# Patient Record
Sex: Female | Born: 1970 | Race: Black or African American | Hispanic: No | Marital: Married | Smoking: Never smoker
Health system: Southern US, Community
[De-identification: ages and names within clinical notes are randomized; demographics above are authoritative.]

## PROBLEM LIST (undated history)

## (undated) DIAGNOSIS — D649 Anemia, unspecified: Secondary | ICD-10-CM

## (undated) DIAGNOSIS — R338 Other retention of urine: Secondary | ICD-10-CM

## (undated) DIAGNOSIS — J189 Pneumonia, unspecified organism: Secondary | ICD-10-CM

## (undated) DIAGNOSIS — E119 Type 2 diabetes mellitus without complications: Secondary | ICD-10-CM

## (undated) DIAGNOSIS — I10 Essential (primary) hypertension: Secondary | ICD-10-CM

## (undated) DIAGNOSIS — K219 Gastro-esophageal reflux disease without esophagitis: Secondary | ICD-10-CM

## (undated) DIAGNOSIS — M1990 Unspecified osteoarthritis, unspecified site: Secondary | ICD-10-CM

## (undated) DIAGNOSIS — K5900 Constipation, unspecified: Secondary | ICD-10-CM

## (undated) HISTORY — PX: CARPAL TUNNEL RELEASE: SHX101

## (undated) HISTORY — PX: OTHER SURGICAL HISTORY: SHX169

## (undated) HISTORY — PX: FEMUR FRACTURE SURGERY: SHX633

## (undated) HISTORY — PX: TUBAL LIGATION: SHX77

---

## 2014-07-04 ENCOUNTER — Emergency Department (HOSPITAL_BASED_OUTPATIENT_CLINIC_OR_DEPARTMENT_OTHER)
Admission: EM | Admit: 2014-07-04 | Discharge: 2014-07-04 | Disposition: A | Discharge status: Home / Self Care | Payer: No Typology Code available for payment source | Attending: Emergency Medicine | Admitting: Emergency Medicine

## 2014-07-04 ENCOUNTER — Encounter (HOSPITAL_BASED_OUTPATIENT_CLINIC_OR_DEPARTMENT_OTHER): Payer: Self-pay | Admitting: *Deleted

## 2014-07-04 DIAGNOSIS — I10 Essential (primary) hypertension: Secondary | ICD-10-CM | POA: Insufficient documentation

## 2014-07-04 DIAGNOSIS — Z79899 Other long term (current) drug therapy: Secondary | ICD-10-CM | POA: Diagnosis not present

## 2014-07-04 DIAGNOSIS — J209 Acute bronchitis, unspecified: Secondary | ICD-10-CM | POA: Insufficient documentation

## 2014-07-04 DIAGNOSIS — J40 Bronchitis, not specified as acute or chronic: Secondary | ICD-10-CM

## 2014-07-04 DIAGNOSIS — R05 Cough: Secondary | ICD-10-CM | POA: Diagnosis present

## 2014-07-04 HISTORY — DX: Essential (primary) hypertension: I10

## 2014-07-04 MED ORDER — DOXYCYCLINE HYCLATE 100 MG PO TABS
100.0000 mg | ORAL_TABLET | Freq: Once | ORAL | Status: AC
  Administered 2014-07-04: 100 mg via ORAL
  Filled 2014-07-04: qty 1

## 2014-07-04 MED ORDER — HYDROCODONE-HOMATROPINE 5-1.5 MG/5ML PO SYRP: 5.0000 mL | ORAL_SOLUTION | Freq: Four times a day (QID) | ORAL | Status: DC | PRN

## 2014-07-04 MED ORDER — DOXYCYCLINE HYCLATE 100 MG PO TABS: 100.0000 mg | ORAL_TABLET | Freq: Two times a day (BID) | ORAL | Status: DC

## 2014-07-04 NOTE — Discharge Instructions (Signed)
Upper Respiratory Infection, Adult An upper respiratory infection (URI) is also sometimes known as the common cold. The upper respiratory tract includes the nose, sinuses, throat, trachea, and bronchi. Bronchi are the airways leading to the lungs. Most people improve within 1 week, but symptoms can last up to 2 weeks. A residual cough may last even longer.  CAUSES Many different viruses can infect the tissues lining the upper respiratory tract. The tissues become irritated and inflamed and often become very moist. Mucus production is also common. A cold is contagious. You can easily spread the virus to others by oral contact. This includes kissing, sharing a glass, coughing, or sneezing. Touching your mouth or nose and then touching a surface, which is then touched by another person, can also spread the virus. SYMPTOMS  Symptoms typically develop 1 to 3 days after you come in contact with a cold virus. Symptoms vary from person to person. They may include:  Runny nose.  Sneezing.  Nasal congestion.  Sinus irritation.  Sore throat.  Loss of voice (laryngitis).  Cough.  Fatigue.  Muscle aches.  Loss of appetite.  Headache.  Low-grade fever. DIAGNOSIS  You might diagnose your own cold based on familiar symptoms, since most people get a cold 2 to 3 times a year. Your caregiver can confirm this based on your exam. Most importantly, your caregiver can check that your symptoms are not due to another disease such as strep throat, sinusitis, pneumonia, asthma, or epiglottitis. Blood tests, throat tests, and X-rays are not necessary to diagnose a common cold, but they may sometimes be helpful in excluding other more serious diseases. Your caregiver will decide if any further tests are required. RISKS AND COMPLICATIONS  You may be at risk for a more severe case of the common cold if you smoke cigarettes, have chronic heart disease (such as heart failure) or lung disease (such as asthma), or if  you have a weakened immune system. The very young and very old are also at risk for more serious infections. Bacterial sinusitis, middle ear infections, and bacterial pneumonia can complicate the common cold. The common cold can worsen asthma and chronic obstructive pulmonary disease (COPD). Sometimes, these complications can require emergency medical care and may be life-threatening. PREVENTION  The best way to protect against getting a cold is to practice good hygiene. Avoid oral or hand contact with people with cold symptoms. Wash your hands often if contact occurs. There is no clear evidence that vitamin C, vitamin E, echinacea, or exercise reduces the chance of developing a cold. However, it is always recommended to get plenty of rest and practice good nutrition. TREATMENT  Treatment is directed at relieving symptoms. There is no cure. Antibiotics are not effective, because the infection is caused by a virus, not by bacteria. Treatment may include:  Increased fluid intake. Sports drinks offer valuable electrolytes, sugars, and fluids.  Breathing heated mist or steam (vaporizer or shower).  Eating chicken soup or other clear broths, and maintaining good nutrition.  Getting plenty of rest.  Using gargles or lozenges for comfort.  Controlling fevers with ibuprofen or acetaminophen as directed by your caregiver.  Increasing usage of your inhaler if you have asthma. Zinc gel and zinc lozenges, taken in the first 24 hours of the common cold, can shorten the duration and lessen the severity of symptoms. Pain medicines may help with fever, muscle aches, and throat pain. A variety of non-prescription medicines are available to treat congestion and runny nose. Your caregiver   can make recommendations and may suggest nasal or lung inhalers for other symptoms.  HOME CARE INSTRUCTIONS   Only take over-the-counter or prescription medicines for pain, discomfort, or fever as directed by your  caregiver.  Use a warm mist humidifier or inhale steam from a shower to increase air moisture. This may keep secretions moist and make it easier to breathe.  Drink enough water and fluids to keep your urine clear or pale yellow.  Rest as needed.  Return to work when your temperature has returned to normal or as your caregiver advises. You may need to stay home longer to avoid infecting others. You can also use a face mask and careful hand washing to prevent spread of the virus. SEEK MEDICAL CARE IF:   After the first few days, you feel you are getting worse rather than better.  You need your caregiver's advice about medicines to control symptoms.  You develop chills, worsening shortness of breath, or brown or red sputum. These may be signs of pneumonia.  You develop yellow or brown nasal discharge or pain in the face, especially when you bend forward. These may be signs of sinusitis.  You develop a fever, swollen neck glands, pain with swallowing, or white areas in the back of your throat. These may be signs of strep throat. SEEK IMMEDIATE MEDICAL CARE IF:   You have a fever.  You develop severe or persistent headache, ear pain, sinus pain, or chest pain.  You develop wheezing, a prolonged cough, cough up blood, or have a change in your usual mucus (if you have chronic lung disease).  You develop sore muscles or a stiff neck. Document Released: 10/09/2000 Document Revised: 07/08/2011 Document Reviewed: 07/21/2013 [employer] Patient Information 2015 [employer]. This information is not intended to replace advice given to you by your health care provider. Make sure you discuss any questions you have with your health care provider.  

## 2014-07-04 NOTE — ED Provider Notes (Signed)
CSN: [id number]     Arrival date & time 07/04/14  1912 History  This chart was scribed for [name], MD by Tonye Royalty, ED Scribe. This patient was seen in room MH03/MH03 and the patient's care was started at 10:14 PM.    Chief Complaint  Patient presents with  . Cough   The history is provided by the patient. No language interpreter was used.    HPI Comments: Jill Holt is a 44 y.o. female who presents to the Emergency Department complaining of cough with onset 1 week ago. She states cough produced mildly colored phlegm, but now is non-productive. She denies smoking or asthma. She notes she had a friend who was sick with an URI. She has HTN and uses medication for it, but does not taken Lisinopril because it causes her to cough. She denies fever.  Past Medical History  Diagnosis Date  . Hypertension    Past Surgical History  Procedure Laterality Date  . Femur fracture surgery    . Carpal tunnel release    . Cesarean section     No family history on file. History  Substance Use Topics  . Smoking status: Never Smoker   . Smokeless tobacco: Never Used  . Alcohol Use: Yes     Comment: occasional   OB History    No data available     Review of Systems  Constitutional: Negative for fever.  Respiratory: Positive for cough.   All other systems reviewed and are negative.     Allergies  Lisinopril  Home Medications   Prior to Admission medications   Medication Sig Start Date End Date Taking? Authorizing Provider  AMLODIPINE BESYLATE PO Take by mouth.   Yes Historical Provider, MD  benzocaine (HURRICAINE) 20 % SOLN Use as directed 1 application in the mouth or throat.   Yes Historical Provider, MD  LOSARTAN POTASSIUM PO Take by mouth.   Yes Historical Provider, MD  doxycycline (VIBRA-TABS) 100 MG tablet Take 1 tablet (100 mg total) by mouth 2 (two) times daily. 07/04/14   [name], MD  HYDROcodone-homatropine [facility]) 5-1.5 MG/5ML syrup Take 5 mLs by mouth every  6 (six) hours as needed for cough. 07/04/14   [name], MD   BP 142/93 mmHg  Pulse 89  Temp(Src) 99 F (37.2 C) (Oral)  Resp 20  Ht  (1.6 m)  Wt 216 lb (97.977 kg)  BMI 38.27 kg/m2  SpO2 98%  LMP 06/27/2014 Physical Exam  Constitutional: She is oriented to person, place, and time. She appears well-developed and well-nourished. No distress.  HENT:  Head: Normocephalic and atraumatic.  Eyes: Pupils are equal, round, and reactive to light.  Neck: Normal range of motion.  Cardiovascular: Normal rate and intact distal pulses.   Pulmonary/Chest: No respiratory distress. She has no wheezes. She has no rales.  Abdominal: Normal appearance. She exhibits no distension. There is no tenderness.  Musculoskeletal: Normal range of motion.  Neurological: She is alert and oriented to person, place, and time. No cranial nerve deficit.  Skin: Skin is warm and dry. No rash noted.  Psychiatric: She has a normal mood and affect. Her behavior is normal.  Nursing note and vitals reviewed.   ED Course  Procedures (including critical care time) Medications  doxycycline (VIBRA-TABS) tablet 100 mg (not administered)     DIAGNOSTIC STUDIES: Oxygen Saturation is 98% on room air, normal by my interpretation.    COORDINATION OF CARE: 10:18 PM Discussed treatment plan with patient at  beside, including antibiotics and cough medication. The patient agrees with the plan and has no further questions at this time.   Labs Review Labs Reviewed - No data to display     MDM   Final diagnoses:  Bronchitis    I personally performed the services described in this documentation, which was scribed in my presence. The recorded information has been reviewed and considered.   Nelva Nayobert Carlester Kasparek, MD 07/04/14 [phone]

## 2014-07-04 NOTE — ED Notes (Signed)
Nagging cough x 1 week- non-productive

## 2015-12-03 ENCOUNTER — Encounter (HOSPITAL_BASED_OUTPATIENT_CLINIC_OR_DEPARTMENT_OTHER): Payer: Self-pay | Admitting: *Deleted

## 2015-12-03 ENCOUNTER — Emergency Department (HOSPITAL_BASED_OUTPATIENT_CLINIC_OR_DEPARTMENT_OTHER): Payer: No Typology Code available for payment source

## 2015-12-03 ENCOUNTER — Emergency Department (HOSPITAL_BASED_OUTPATIENT_CLINIC_OR_DEPARTMENT_OTHER)
Admission: EM | Admit: 2015-12-03 | Discharge: 2015-12-03 | Disposition: A | Discharge status: Home / Self Care | Payer: No Typology Code available for payment source | Attending: Emergency Medicine | Admitting: Emergency Medicine

## 2015-12-03 DIAGNOSIS — E119 Type 2 diabetes mellitus without complications: Secondary | ICD-10-CM | POA: Insufficient documentation

## 2015-12-03 DIAGNOSIS — I10 Essential (primary) hypertension: Secondary | ICD-10-CM | POA: Insufficient documentation

## 2015-12-03 DIAGNOSIS — M545 Low back pain, unspecified: Secondary | ICD-10-CM

## 2015-12-03 DIAGNOSIS — M549 Dorsalgia, unspecified: Secondary | ICD-10-CM | POA: Diagnosis present

## 2015-12-03 DIAGNOSIS — Z79899 Other long term (current) drug therapy: Secondary | ICD-10-CM | POA: Diagnosis not present

## 2015-12-03 HISTORY — DX: Type 2 diabetes mellitus without complications: E11.9

## 2015-12-03 LAB — URINE MICROSCOPIC-ADD ON

## 2015-12-03 LAB — URINALYSIS, ROUTINE W REFLEX MICROSCOPIC
Bilirubin Urine: NEGATIVE
Glucose, UA: 100 mg/dL — AB
Hgb urine dipstick: NEGATIVE
KETONES UR: NEGATIVE mg/dL
Leukocytes, UA: NEGATIVE
NITRITE: NEGATIVE
Protein, ur: 100 mg/dL — AB
Specific Gravity, Urine: 1.024 (ref 1.005–1.030)
pH: 6 (ref 5.0–8.0)

## 2015-12-03 LAB — PREGNANCY, URINE: Preg Test, Ur: NEGATIVE

## 2015-12-03 MED ORDER — IBUPROFEN 800 MG PO TABS
800.0000 mg | ORAL_TABLET | Freq: Once | ORAL | Status: AC
  Administered 2015-12-03: 800 mg via ORAL
  Filled 2015-12-03: qty 1

## 2015-12-03 NOTE — ED Provider Notes (Signed)
[facility]   CSN: [id number] Arrival date & time: 12/03/15  1751  First Provider Contact:   First MD Initiated Contact with Patient 12/03/15 2056    By signing my name below, I, Jill Holt, attest that this documentation has been prepared under the direction and in the presence of Jill Guise, MD . Electronically Signed: Levon Holt, Scribe. 12/03/2015. 9:47 PM  History   Chief Complaint Chief Complaint  Patient presents with  . Back Pain    HPI Jill Holt is a 45 y.o. female with PMHx opfHTN and DM who presents to the Emergency Department complaining of moderate, sudden onset low back pain which began three days ago. She was standing up from the toilet at the time of onset. Pt denies any injury or fall. She states pain is reproduced when she is standing up after initially sitting or laying for a long time, but fades and goes away after she walks around for a while. Complains of mild suprapubic discomfort. States two weeks ago had soreness over left side of neck w/ movement, but now resolved. Pt was given 800 mg ibuprofen in the ED with relief. She states she has never had back pain like this before. Pt denies fever, chills, dysuria, increased urinary frequency, increased urgency, incontinence, CP, SOB, numbness, or  weakness.    The history is provided by the patient. No language interpreter was used.    Past Medical History:  Diagnosis Date  . Diabetes mellitus without complication (HCC)   . Hypertension     There are no active problems to display for this patient.   Past Surgical History:  Procedure Laterality Date  . CARPAL TUNNEL RELEASE    . CESAREAN SECTION    . FEMUR FRACTURE SURGERY      OB History    No data available      Home Medications    Prior to Admission medications   Medication Sig Start Date End Date Taking? Authorizing Provider  Canagliflozin (INVOKANA PO) Take by mouth.   Yes Historical Provider, MD    AMLODIPINE BESYLATE PO Take by mouth.    Historical Provider, MD  benzocaine (HURRICAINE) 20 % SOLN Use as directed 1 application in the mouth or throat.    Historical Provider, MD  doxycycline (VIBRA-TABS) 100 MG tablet Take 1 tablet (100 mg total) by mouth 2 (two) times daily. 07/04/14   [name], MD  HYDROcodone-homatropine [facility]) 5-1.5 MG/5ML syrup Take 5 mLs by mouth every 6 (six) hours as needed for cough. 07/04/14   [name], MD  LOSARTAN POTASSIUM PO Take by mouth.    Historical Provider, MD    Family History No family history on file.  Social History Social History  Substance Use Topics  . Smoking status: Never Smoker  . Smokeless tobacco: Never Used  . Alcohol use Yes     Comment: occasional     Allergies   Lisinopril   Review of Systems Review of Systems  Constitutional: Negative for chills and fever.  Respiratory: Negative for shortness of breath.   Cardiovascular: Negative for chest pain.  Gastrointestinal: Positive for abdominal pain.  Genitourinary: Negative for dysuria, frequency and urgency.  Musculoskeletal: Positive for back pain and neck pain.  Neurological: Negative for weakness and numbness.  All other systems reviewed and are negative.   Physical Exam Updated Vital Signs BP 149/100 (BP Location: Right Arm)   Pulse 81   Temp 99 F (37.2 C)   Resp 20  Ht 5\' 3"  (1.6 m)   Wt 220 lb (99.8 kg)   LMP 11/13/2015   SpO2 98%   BMI 38.97 kg/m   Physical Exam Physical Exam  Nursing note and vitals reviewed. Constitutional: Well developed, well nourished, non-toxic, and in no acute distress Head: Normocephalic and atraumatic.  Mouth/Throat: Oropharynx is clear and moist.  Neck: Normal range of motion. Neck supple. No neck tenderness. Cardiovascular: Normal rate and regular rhythm.  +2 DP pulses bilaterally.  Pulmonary/Chest: Effort normal and breath sounds normal.  Abdominal: Soft. There is no tenderness. There is no rebound and no  guarding. No CVA tenderness. Suprapubic TTP.  Musculoskeletal: Normal range of motion. No ttp of the tls spine. No step-offs or deformities.  Neurological: Alert, no facial droop, fluent speech, moves all extremities symmetrically. Sensation to light touch intact throughout BLE. She has full strength in bilateral ankle, dorsi-, and plantar flexion, bilateral knee flexion and extension, and bilateral hip flexion and extension  Skin: Skin is warm and dry.  Psychiatric: Cooperative  ED Treatments / Results  DIAGNOSTIC STUDIES:  Oxygen Saturation is 99% on RA, normal by my interpretation.    COORDINATION OF CARE:  9:03 PM Discussed treatment plan with pt at bedside and pt agreed to plan.  Labs (all labs ordered are listed, but only abnormal results are displayed) Labs Reviewed  URINALYSIS, ROUTINE W REFLEX MICROSCOPIC (NOT AT [facility]) - Abnormal; Notable for the following:       Result Value   Glucose, UA 100 (*)    Protein, ur 100 (*)    All other components within normal limits  URINE MICROSCOPIC-ADD ON - Abnormal; Notable for the following:    Squamous Epithelial / LPF 0-5 (*)    Bacteria, UA MANY (*)    All other components within normal limits  PREGNANCY, URINE    EKG  EKG Interpretation None      Radiology Dg Lumbar Spine Complete  Result Date: 12/03/2015 CLINICAL DATA:  Acute onset of lower back pain, radiating to the coccyx. Initial encounter. EXAM: LUMBAR SPINE - COMPLETE 4+ VIEW COMPARISON:  CT of the abdomen and pelvis from 03/11/2015 FINDINGS: There is no evidence of fracture or subluxation. Vertebral bodies demonstrate normal height and alignment. Intervertebral disc spaces are preserved. The visualized neural foramina are grossly unremarkable in appearance. The visualized bowel gas pattern is unremarkable in appearance; air and stool are noted within the colon. The sacroiliac joints are within normal limits. IMPRESSION: No evidence of fracture or subluxation along the  lumbar spine. Electronically Signed   By: Roanna Raider M.D.   On: 12/03/2015 22:27   Dg Sacrum/coccyx  Result Date: 12/03/2015 CLINICAL DATA:  Acute onset of coccygeal pain.  Initial encounter. EXAM: SACRUM AND COCCYX - 2+ VIEW COMPARISON:  CT of the abdomen and pelvis from 03/11/2015 FINDINGS: The sacrum and coccyx appear grossly intact. There is no evidence of fracture or dislocation. The lower lumbar spine is grossly unremarkable in appearance. The sacroiliac joints are within normal limits. IMPRESSION: No evidence of fracture or dislocation. Electronically Signed   By: Roanna Raider M.D.   On: 12/03/2015 22:27    Procedures Procedures (including critical care time)  Medications Ordered in ED Medications  ibuprofen (ADVIL,MOTRIN) tablet 800 mg (800 mg Oral Given 12/03/15 1910)    Initial Impression / Assessment and Plan / ED Course  I have reviewed the triage vital signs and the nursing notes.  Pertinent labs & imaging results that were available during my care  of the patient were reviewed by me and considered in my medical decision making (see chart for details).  Clinical Course    45 year old female who presents with low back pain. On presentation is nontoxic in no acute distress. She is neurologically intact. No concerning signs or features on history or exam that would be suggestive of emergent spinal cord process. She has no significant pain on exam, but states that when she gets up from seated position feels stiffness and soreness in her tailbone, that subsequently resolves with ambulation. X-rays does not show any abnormalities. Also complained of some suprapubic discomfort, UA and pregnancy test negative. Abdomen overall soft and benign. Presentation not suggestive of retroperitoneal or intra-abdominal process. I pain relieved after ibuprofen. She is felt stable for discharge home. Strict return and follow-up instructions reviewed. She expressed understanding of all discharge  instructions and felt comfortable with the plan of care.   Final Clinical Impressions(s) / ED Diagnoses   Final diagnoses:  Midline low back pain without sciatica   I personally performed the services described in this documentation, which was scribed in my presence. The recorded information has been reviewed and is accurate.   New Prescriptions New Prescriptions   No medications on file     Jill Guiseana Duo Kannen Moxey, MD 12/03/15 2259

## 2015-12-03 NOTE — Discharge Instructions (Signed)
Continue ibuprofen and tylenol for pain control. Return for worsening symptoms, including worsening pain, numbness/weakness, severe abdominal pain, or any other symptoms concerning to you.

## 2015-12-03 NOTE — ED Notes (Signed)
Pt reports pain to the R shoulder area x1 week.

## 2015-12-03 NOTE — ED Notes (Addendum)
Pt now telling this RN she has had lumbar back pain x3 days. Denies incontinence or radiation of pain.

## 2015-12-24 ENCOUNTER — Encounter (HOSPITAL_BASED_OUTPATIENT_CLINIC_OR_DEPARTMENT_OTHER): Payer: Self-pay | Admitting: Emergency Medicine

## 2015-12-24 ENCOUNTER — Emergency Department (HOSPITAL_BASED_OUTPATIENT_CLINIC_OR_DEPARTMENT_OTHER)
Admission: EM | Admit: 2015-12-24 | Discharge: 2015-12-24 | Disposition: A | Discharge status: Home / Self Care | Payer: No Typology Code available for payment source | Attending: Emergency Medicine | Admitting: Emergency Medicine

## 2015-12-24 DIAGNOSIS — N939 Abnormal uterine and vaginal bleeding, unspecified: Secondary | ICD-10-CM

## 2015-12-24 DIAGNOSIS — N938 Other specified abnormal uterine and vaginal bleeding: Secondary | ICD-10-CM | POA: Insufficient documentation

## 2015-12-24 DIAGNOSIS — Z7984 Long term (current) use of oral hypoglycemic drugs: Secondary | ICD-10-CM | POA: Diagnosis not present

## 2015-12-24 DIAGNOSIS — IMO0001 Reserved for inherently not codable concepts without codable children: Secondary | ICD-10-CM

## 2015-12-24 DIAGNOSIS — I10 Essential (primary) hypertension: Secondary | ICD-10-CM | POA: Insufficient documentation

## 2015-12-24 DIAGNOSIS — N898 Other specified noninflammatory disorders of vagina: Secondary | ICD-10-CM | POA: Diagnosis present

## 2015-12-24 DIAGNOSIS — R030 Elevated blood-pressure reading, without diagnosis of hypertension: Secondary | ICD-10-CM

## 2015-12-24 DIAGNOSIS — Z79899 Other long term (current) drug therapy: Secondary | ICD-10-CM | POA: Diagnosis not present

## 2015-12-24 DIAGNOSIS — E119 Type 2 diabetes mellitus without complications: Secondary | ICD-10-CM | POA: Diagnosis not present

## 2015-12-24 LAB — URINALYSIS, ROUTINE W REFLEX MICROSCOPIC
Bilirubin Urine: NEGATIVE
HGB URINE DIPSTICK: NEGATIVE
Ketones, ur: NEGATIVE mg/dL
Leukocytes, UA: NEGATIVE
Nitrite: NEGATIVE
Protein, ur: NEGATIVE mg/dL
SPECIFIC GRAVITY, URINE: 1.031 — AB (ref 1.005–1.030)
pH: 5.5 (ref 5.0–8.0)

## 2015-12-24 LAB — URINE MICROSCOPIC-ADD ON: RBC / HPF: NONE SEEN RBC/hpf (ref 0–5)

## 2015-12-24 LAB — PREGNANCY, URINE: Preg Test, Ur: NEGATIVE

## 2015-12-24 LAB — WET PREP, GENITAL
CLUE CELLS WET PREP: NONE SEEN
Sperm: NONE SEEN
Trich, Wet Prep: NONE SEEN
Yeast Wet Prep HPF POC: NONE SEEN

## 2015-12-24 NOTE — Discharge Instructions (Signed)
Follow up with your primary care provider for blood pressure recheck this week. Take your blood pressure medication every day as directed by your physician. Continue working on improving your blood sugar control.  Call your OBGYN in the morning to schedule follow up appointment. Continue tylenol as needed for pain.  Return to ER for new or worsening symptoms, any additional concerns.

## 2015-12-24 NOTE — ED Triage Notes (Signed)
Patient stats that she is having pelvic pain since about Friday. Patient reports that she is having a discharge

## 2015-12-24 NOTE — ED Provider Notes (Signed)
[facility] Provider Note   CSN: [id number] Arrival date & time: 12/24/15  1210     History   Chief Complaint Chief Complaint  Patient presents with  . Vaginal Discharge    HPI Jill Holt is a 45 y.o. female.  The history is provided by the patient and medical records. No language interpreter was used.   Jill Holt is a 45 y.o. female  with a PMH of DM, HTN who presents to the Emergency Department complaining of persistent brown vaginal discharge x 3 days. Endorses pelvic discomfort but no abdominal pain. No n/v, fever, back pain, dysuria, vaginal itching, urinary urgency/frequency. Followed by OBGYN and has left messages at their office with no call back which prompted her to come to ED for evaluation. LMP 12/10/15. No alleviating or aggravating factors noted. No medications or treatments taken prior to arrival for symptoms.   Past Medical History:  Diagnosis Date  . Diabetes mellitus without complication (HCC)   . Hypertension     There are no active problems to display for this patient.   Past Surgical History:  Procedure Laterality Date  . CARPAL TUNNEL RELEASE    . CESAREAN SECTION    . FEMUR FRACTURE SURGERY      OB History    No data available       Home Medications    Prior to Admission medications   Medication Sig Start Date End Date Taking? Authorizing Provider  metFORMIN (GLUCOPHAGE) 1000 MG tablet Take 1,000 mg by mouth 2 (two) times daily with a meal.   Yes Historical Provider, MD  valACYclovir (VALTREX) 1000 MG tablet Take 1,000 mg by mouth 2 (two) times daily.   Yes Historical Provider, MD  AMLODIPINE BESYLATE PO Take by mouth.    Historical Provider, MD  benzocaine (HURRICAINE) 20 % SOLN Use as directed 1 application in the mouth or throat.    Historical Provider, MD  Canagliflozin (INVOKANA PO) Take by mouth.    Historical Provider, MD  doxycycline (VIBRA-TABS) 100 MG tablet Take 1 tablet (100 mg total) by mouth 2 (two) times  daily. 07/04/14   [name], MD  HYDROcodone-homatropine [facility]) 5-1.5 MG/5ML syrup Take 5 mLs by mouth every 6 (six) hours as needed for cough. 07/04/14   [name], MD  LOSARTAN POTASSIUM PO Take by mouth.    Historical Provider, MD    Family History History reviewed. No pertinent family history.  Social History Social History  Substance Use Topics  . Smoking status: Never Smoker  . Smokeless tobacco: Never Used  . Alcohol use Yes     Comment: occasional     Allergies   Lisinopril   Review of Systems Review of Systems  Constitutional: Negative for chills and fever.  HENT: Negative for congestion.   Eyes: Negative for visual disturbance.  Respiratory: Negative for cough and shortness of breath.   Cardiovascular: Negative.   Gastrointestinal: Negative for abdominal pain, nausea and vomiting.  Genitourinary: Positive for pelvic pain and vaginal discharge [facility]). Negative for dysuria.  Musculoskeletal: Negative for back pain.  Skin: Negative for rash.  Neurological: Negative for headaches.     Physical Exam Updated Vital Signs BP (!) 152/103 (BP Location: Right Arm)   Pulse 82   Temp 98.3 F (36.8 C) (Oral)   Resp 16   Ht 5\' 2"  (1.575 m)   Wt 99.3 kg   LMP 12/09/2015   SpO2 100%   BMI 40.06 kg/m   Physical Exam  Constitutional: She  is oriented to person, place, and time. She appears well-developed and well-nourished. No distress.  HENT:  Head: Normocephalic and atraumatic.  Cardiovascular: Normal rate, regular rhythm and normal heart sounds.  Exam reveals no gallop and no friction rub.   No murmur heard. Pulmonary/Chest: Effort normal and breath sounds normal. No respiratory distress. She has no wheezes. She has no rales.  Abdominal: Soft. Bowel sounds are normal. She exhibits no distension. There is no tenderness.  Genitourinary:  Genitourinary Comments: Chaperone present for exam. + bleeding.  No rashes, lesions, or tenderness to external genitalia.  No erythema, injury, or tenderness to vaginal mucosa. No vaginal discharge. No adnexal masses, tenderness, or fullness. No CMT.  Musculoskeletal: She exhibits no edema.  Neurological: She is alert and oriented to person, place, and time.  Nursing note and vitals reviewed.    ED Treatments / Results  Labs (all labs ordered are listed, but only abnormal results are displayed) Labs Reviewed  WET PREP, GENITAL - Abnormal; Notable for the following:       Result Value   WBC, Wet Prep HPF POC MODERATE (*)    All other components within normal limits  URINALYSIS, ROUTINE W REFLEX MICROSCOPIC (NOT AT [facility]) - Abnormal; Notable for the following:    Specific Gravity, Urine 1.031 (*)    Glucose, UA >1000 (*)    All other components within normal limits  URINE MICROSCOPIC-ADD ON - Abnormal; Notable for the following:    Squamous Epithelial / LPF 0-5 (*)    Bacteria, UA RARE (*)    All other components within normal limits  PREGNANCY, URINE  GC/CHLAMYDIA PROBE AMP ([place]) NOT AT [facility]    EKG  EKG Interpretation None       Radiology No results found.  Procedures Procedures (including critical care time)  Medications Ordered in ED Medications - No data to display   Initial Impression / Assessment and Plan / ED Course  I have reviewed the triage vital signs and the nursing notes.  Pertinent labs & imaging results that were available during my care of the patient were reviewed by me and considered in my medical decision making (see chart for details).  Clinical Course   Jill Holt is a 45 y.o. female who presents to ED for dark brown vaginal discharge x 2 days. LMP 12/10/15. Upreg negative. UA with no signs of infection. She does have greater than 1000 glucose in urine. History of diabetes and not checking sugars frequently. She does not complain of abdominal pain and is very well-appearing. She states that her A1c was in the tens and has now come down to the nines. She  states that she has made dietary changes and is working to improve her sugars, however they typically run in the 200-300s. Importance of glycemic control discussed-follow up with PCP.  Pelvic exam with active bleeding. G&C obtained. Wet prep with moderate WBC's, otherwise normal. Patient has an OB/GYN that she can follow up with and I strongly encouraged her to do so.  Elevated BP while in ED. Endorses compliance with home BP medications. Follow up with PCP for BP recheck this week.   Evaluation does not show pathology that would require ongoing emergent intervention or inpatient treatment. Patient is hemodynamically stable and mentating appropriately. Return precautions discussed and all questions answered.    Final Clinical Impressions(s) / ED Diagnoses   Final diagnoses:  Vaginal bleeding  Elevated blood pressure    New Prescriptions New Prescriptions   No medications  on file     [facility] Benedicto Capozzi, PA-C 12/24/15 1444    Gwyneth Sprout, MD 12/24/15 1536

## 2015-12-25 LAB — GC/CHLAMYDIA PROBE AMP (~~LOC~~) NOT AT ARMC
Chlamydia: NEGATIVE
Neisseria Gonorrhea: NEGATIVE

## 2016-01-28 ENCOUNTER — Emergency Department (HOSPITAL_BASED_OUTPATIENT_CLINIC_OR_DEPARTMENT_OTHER): Payer: No Typology Code available for payment source

## 2016-01-28 ENCOUNTER — Encounter (HOSPITAL_BASED_OUTPATIENT_CLINIC_OR_DEPARTMENT_OTHER): Payer: Self-pay | Admitting: *Deleted

## 2016-01-28 DIAGNOSIS — Z7984 Long term (current) use of oral hypoglycemic drugs: Secondary | ICD-10-CM | POA: Insufficient documentation

## 2016-01-28 DIAGNOSIS — M25461 Effusion, right knee: Secondary | ICD-10-CM | POA: Diagnosis not present

## 2016-01-28 DIAGNOSIS — Y999 Unspecified external cause status: Secondary | ICD-10-CM | POA: Diagnosis not present

## 2016-01-28 DIAGNOSIS — E119 Type 2 diabetes mellitus without complications: Secondary | ICD-10-CM | POA: Diagnosis not present

## 2016-01-28 DIAGNOSIS — Y939 Activity, unspecified: Secondary | ICD-10-CM | POA: Insufficient documentation

## 2016-01-28 DIAGNOSIS — Z79899 Other long term (current) drug therapy: Secondary | ICD-10-CM | POA: Diagnosis not present

## 2016-01-28 DIAGNOSIS — Y929 Unspecified place or not applicable: Secondary | ICD-10-CM | POA: Insufficient documentation

## 2016-01-28 DIAGNOSIS — S8991XA Unspecified injury of right lower leg, initial encounter: Secondary | ICD-10-CM | POA: Insufficient documentation

## 2016-01-28 DIAGNOSIS — I10 Essential (primary) hypertension: Secondary | ICD-10-CM | POA: Insufficient documentation
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
## 2016-01-29 ENCOUNTER — Emergency Department (HOSPITAL_BASED_OUTPATIENT_CLINIC_OR_DEPARTMENT_OTHER)
Admission: EM | Admit: 2016-01-29 | Discharge: 2016-01-29 | Disposition: A | Discharge status: Home / Self Care | Payer: No Typology Code available for payment source | Attending: Emergency Medicine | Admitting: Emergency Medicine

## 2016-01-29 DIAGNOSIS — M25461 Effusion, right knee: Secondary | ICD-10-CM

## 2016-01-29 DIAGNOSIS — S8991XA Unspecified injury of right lower leg, initial encounter: Secondary | ICD-10-CM

## 2016-01-29 MED ORDER — NAPROXEN 250 MG PO TABS
500.0000 mg | ORAL_TABLET | Freq: Once | ORAL | Status: AC
  Administered 2016-01-29: 500 mg via ORAL
  Filled 2016-01-29: qty 2

## 2016-01-29 MED ORDER — LIDOCAINE-EPINEPHRINE 2 %-1:100000 IJ SOLN
20.0000 mL | Freq: Once | INTRAMUSCULAR | Status: DC
  Filled 2016-01-29: qty 1

## 2016-01-29 MED ORDER — HYDROCODONE-ACETAMINOPHEN 5-325 MG PO TABS: 1.0000 | ORAL_TABLET | Freq: Four times a day (QID) | ORAL | 0 refills | Status: DC | PRN

## 2016-01-29 MED ORDER — NAPROXEN 500 MG PO TABS: ORAL_TABLET | ORAL | 0 refills | Status: DC

## 2016-01-29 MED ORDER — NAPROXEN 250 MG PO TABS
ORAL_TABLET | ORAL | Status: AC
  Filled 2016-01-29: qty 2
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
## 2017-01-25 ENCOUNTER — Emergency Department (HOSPITAL_BASED_OUTPATIENT_CLINIC_OR_DEPARTMENT_OTHER): Payer: No Typology Code available for payment source

## 2017-01-25 ENCOUNTER — Encounter (HOSPITAL_BASED_OUTPATIENT_CLINIC_OR_DEPARTMENT_OTHER): Payer: Self-pay | Admitting: Emergency Medicine

## 2017-01-25 ENCOUNTER — Emergency Department (HOSPITAL_BASED_OUTPATIENT_CLINIC_OR_DEPARTMENT_OTHER)
Admission: EM | Admit: 2017-01-25 | Discharge: 2017-01-25 | Disposition: A | Discharge status: Home / Self Care | Payer: No Typology Code available for payment source | Attending: Emergency Medicine | Admitting: Emergency Medicine

## 2017-01-25 DIAGNOSIS — M25551 Pain in right hip: Secondary | ICD-10-CM | POA: Diagnosis present

## 2017-01-25 DIAGNOSIS — I10 Essential (primary) hypertension: Secondary | ICD-10-CM | POA: Diagnosis not present

## 2017-01-25 DIAGNOSIS — E119 Type 2 diabetes mellitus without complications: Secondary | ICD-10-CM | POA: Diagnosis not present

## 2017-01-25 DIAGNOSIS — M25552 Pain in left hip: Secondary | ICD-10-CM | POA: Insufficient documentation

## 2017-01-25 DIAGNOSIS — Z7984 Long term (current) use of oral hypoglycemic drugs: Secondary | ICD-10-CM | POA: Insufficient documentation

## 2017-01-25 MED ORDER — KETOROLAC TROMETHAMINE 30 MG/ML IJ SOLN
15.0000 mg | Freq: Once | INTRAMUSCULAR | Status: AC
  Administered 2017-01-25: 15 mg via INTRAMUSCULAR
  Filled 2017-01-25: qty 1

## 2017-01-25 MED ORDER — TRAMADOL HCL 50 MG PO TABS: 50.0000 mg | ORAL_TABLET | Freq: Four times a day (QID) | ORAL | 0 refills | Status: DC | PRN

## 2017-01-25 MED ORDER — IBUPROFEN 400 MG PO TABS: 400.0000 mg | ORAL_TABLET | Freq: Three times a day (TID) | ORAL | 0 refills | Status: AC

## 2017-01-25 NOTE — ED Triage Notes (Signed)
PT presents with c/o bilateral hip pain for the past 5 days. PT states she has a rod in her left femur. PT ambulatory in waiting room and triage.

## 2017-01-25 NOTE — ED Provider Notes (Signed)
[facility]   CSN: [id number] Arrival date & time: 01/25/17  1937     History   Chief Complaint Chief Complaint  Patient presents with  . Hip Pain    HPI Jill Holt is a 46 y.o. female.  HPI Patient presents with concern of bilateral left greater than right hip pain. Onset was within the past few days, since onset symptoms of been worsening, with increasing soreness in both legs. No new fall, trauma, accident. 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 Over the past week she has worked greater than 200 hours. She does have a history of left hip arthroplasty following an accident occurred in the distant past. Otherwise no recent trauma, fall, activity. Since onset minimal relief with ibuprofen or Tylenol. She has been unable to rest sufficiently due to increased work demands.     Past Medical History:  Diagnosis Date  . Diabetes mellitus without complication (HCC)   . Hypertension     There are no active problems to display for this patient.   Past Surgical History:  Procedure Laterality Date  . CARPAL TUNNEL RELEASE    . CESAREAN SECTION    . FEMUR FRACTURE SURGERY      OB History    No data available       Home Medications    Prior to Admission medications   Medication Sig Start Date End Date Taking? Authorizing Provider  AMLODIPINE BESYLATE PO Take by mouth.    [provider]  benzocaine (HURRICAINE) 20 % SOLN Use as directed 1 application in the mouth or throat.    [provider]  Canagliflozin (INVOKANA PO) Take by mouth.    [provider]  doxycycline (VIBRA-TABS) 100 MG tablet Take 1 tablet (100 mg total) by mouth 2 (two) times daily. 07/04/14   [name], MD  HYDROcodone-acetaminophen (NORCO) 5-325 MG tablet Take 1-2 tablets by mouth every 6 (six) hours as needed for severe pain. 01/29/16   Molpus, John, MD  HYDROcodone-homatropine [facility]) 5-1.5 MG/5ML syrup Take 5  mLs by mouth every 6 (six) hours as needed for cough. 07/04/14   [name], MD  ibuprofen (ADVIL,MOTRIN) 400 MG tablet Take 1 tablet (400 mg total) by mouth 3 (three) times daily. Take one tablet three times daily for three days 01/25/17 01/28/17  Gerhard Munch, MD  LOSARTAN POTASSIUM PO Take by mouth.    [provider]  metFORMIN (GLUCOPHAGE) 1000 MG tablet Take 1,000 mg by mouth 2 (two) times daily with a meal.    [provider]  naproxen (NAPROSYN) 500 MG tablet Take one tablet twice daily as needed for knee pain. Best taken with a meal. 01/29/16   Molpus, Jonny Ruiz, MD  traMADol (ULTRAM) 50 MG tablet Take 1 tablet (50 mg total) by mouth every 6 (six) hours as needed. 01/25/17   Gerhard Munch, MD  valACYclovir (VALTREX) 1000 MG tablet Take 1,000 mg by mouth 2 (two) times daily.    [provider]    Family History No family history on file.  Social History Social History  Substance Use Topics  . Smoking status: Never Smoker  . Smokeless tobacco: Never Used  . Alcohol use Yes     Comment: occasional     Allergies   Lisinopril   Review of Systems Review of Systems  Constitutional:       Per HPI, otherwise negative  HENT:       Per HPI, otherwise  negative  Respiratory:       Per HPI, otherwise negative  Cardiovascular:       Per HPI, otherwise negative  Gastrointestinal: Negative for vomiting.  Endocrine:       Negative aside from HPI  Genitourinary:       Neg aside from HPI   Musculoskeletal:       Per HPI, otherwise negative  Skin: Negative.   Neurological: Negative for syncope.     Physical Exam Updated Vital Signs BP 135/87 (BP Location: Left Arm)   Pulse 88   Temp 98.2 F (36.8 C) (Oral)   Resp 20   LMP 01/13/2017   SpO2 99%   Physical Exam  Constitutional: She is oriented to person, place, and time. She appears well-developed and well-nourished. No distress.  HENT:  Head: Normocephalic and atraumatic.  Eyes: Conjunctivae  and EOM are normal.  Cardiovascular: Normal rate and regular rhythm.   Pulmonary/Chest: Effort normal. No stridor. No respiratory distress.  Abdominal: She exhibits no distension.  Musculoskeletal: She exhibits no edema.  No gross deformities of either hip, patient flexes each hip 5/5 strength, no notable discomfort.   Neurological: She is alert and oriented to person, place, and time. No cranial nerve deficit.  Skin: Skin is warm and dry.  Psychiatric: She has a normal mood and affect.  Nursing note and vitals reviewed.    ED Treatments / Results   Radiology Dg Hips Bilat W Or Wo Pelvis 5 Views  Result Date: 01/25/2017 CLINICAL DATA:  Complains of bilateral hip pain EXAM: DG HIP (WITH OR WITHOUT PELVIS) 5+V BILAT COMPARISON:  None FINDINGS: Previous ORIF of the left femur. IM rod is identified. Chronic healed fracture deformity can be seen involving the distal diaphysis of the left femur. No acute fractures or subluxations identified involving either hip. 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 IMPRESSION: 1. No acute bone abnormalities identified. 2. If there is high clinical suspicion for occult fracture or the patient refuses to weightbear, consider further evaluation with MRI. Although CT is expeditious, evidence is lacking regarding accuracy of CT over plain film radiography. Electronically Signed   By: Signa Kell M.D.   On: 01/25/2017 20:19    Procedures Procedures (including critical care time)  Medications Ordered in ED Medications  ketorolac (TORADOL) 30 MG/ML injection 15 mg (not administered)     Initial Impression / Assessment and Plan / ED Course  I have reviewed the triage vital signs and the nursing notes.  Pertinent labs & imaging results that were available during my care of the patient were reviewed by me and considered in my medical decision making (see chart for details).    History of prior hip arthroplasty presents with ongoing bilateral hip pain,  including worsening pain on the left side. Symptoms likely musculoskeletal as there is no evidence for fracture, patient acknowledges working substantial hours, and there is no distal neurovascular compromise, nor evidence for infection or   Final Clinical Impressions(s) / ED Diagnoses   Final diagnoses:  Left hip pain    New Prescriptions New Prescriptions   IBUPROFEN (ADVIL,MOTRIN) 400 MG TABLET    Take 1 tablet (400 mg total) by mouth 3 (three) times daily. Take one tablet three times daily for three days   TRAMADOL (ULTRAM) 50 MG TABLET    Take 1 tablet (50 mg total) by mouth every 6 (six) hours as needed.     Gerhard Munch, MD 01/25/17 2048

## 2017-01-25 NOTE — Discharge Instructions (Signed)
As discussed, your evaluation today has been largely reassuring.  But, it is important that you monitor your condition carefully, and do not hesitate to return to the ED if you develop new, or concerning changes in your condition. ? ?Otherwise, please follow-up with your physician for appropriate ongoing care. ? ?

## 2017-06-15 ENCOUNTER — Emergency Department (HOSPITAL_BASED_OUTPATIENT_CLINIC_OR_DEPARTMENT_OTHER)
Admission: EM | Admit: 2017-06-15 | Discharge: 2017-06-15 | Disposition: A | Discharge status: Home / Self Care | Payer: No Typology Code available for payment source | Attending: Emergency Medicine | Admitting: Emergency Medicine

## 2017-06-15 ENCOUNTER — Emergency Department (HOSPITAL_BASED_OUTPATIENT_CLINIC_OR_DEPARTMENT_OTHER): Payer: No Typology Code available for payment source

## 2017-06-15 ENCOUNTER — Other Ambulatory Visit: Payer: Self-pay

## 2017-06-15 ENCOUNTER — Encounter (HOSPITAL_BASED_OUTPATIENT_CLINIC_OR_DEPARTMENT_OTHER): Payer: Self-pay | Admitting: Emergency Medicine

## 2017-06-15 DIAGNOSIS — J3489 Other specified disorders of nose and nasal sinuses: Secondary | ICD-10-CM | POA: Insufficient documentation

## 2017-06-15 DIAGNOSIS — R51 Headache: Secondary | ICD-10-CM | POA: Diagnosis not present

## 2017-06-15 DIAGNOSIS — R0981 Nasal congestion: Secondary | ICD-10-CM | POA: Diagnosis not present

## 2017-06-15 DIAGNOSIS — R05 Cough: Secondary | ICD-10-CM | POA: Diagnosis not present

## 2017-06-15 DIAGNOSIS — R739 Hyperglycemia, unspecified: Secondary | ICD-10-CM

## 2017-06-15 DIAGNOSIS — J069 Acute upper respiratory infection, unspecified: Secondary | ICD-10-CM | POA: Diagnosis not present

## 2017-06-15 DIAGNOSIS — I10 Essential (primary) hypertension: Secondary | ICD-10-CM | POA: Insufficient documentation

## 2017-06-15 DIAGNOSIS — Z7984 Long term (current) use of oral hypoglycemic drugs: Secondary | ICD-10-CM | POA: Diagnosis not present

## 2017-06-15 DIAGNOSIS — E1165 Type 2 diabetes mellitus with hyperglycemia: Secondary | ICD-10-CM | POA: Insufficient documentation

## 2017-06-15 DIAGNOSIS — Z79899 Other long term (current) drug therapy: Secondary | ICD-10-CM | POA: Insufficient documentation

## 2017-06-15 LAB — URINALYSIS, MICROSCOPIC (REFLEX): RBC / HPF: NONE SEEN RBC/hpf (ref 0–5)

## 2017-06-15 LAB — CBC
HCT: 42.1 % (ref 36.0–46.0)
Hemoglobin: 14.5 g/dL (ref 12.0–15.0)
MCH: 27.9 pg (ref 26.0–34.0)
MCHC: 34.4 g/dL (ref 30.0–36.0)
MCV: 81 fL (ref 78.0–100.0)
PLATELETS: 150 10*3/uL (ref 150–400)
RBC: 5.2 MIL/uL — AB (ref 3.87–5.11)
RDW: 13.1 % (ref 11.5–15.5)
WBC: 3.5 10*3/uL — ABNORMAL LOW (ref 4.0–10.5)

## 2017-06-15 LAB — URINALYSIS, ROUTINE W REFLEX MICROSCOPIC
Bilirubin Urine: NEGATIVE
Glucose, UA: >=500 mg/dL — AB
HGB URINE DIPSTICK: NEGATIVE
Ketones, ur: NEGATIVE mg/dL
Leukocytes, UA: NEGATIVE
Nitrite: NEGATIVE
PH: 5.5 (ref 5.0–8.0)
Protein, ur: NEGATIVE mg/dL
SPECIFIC GRAVITY, URINE: 1.025 (ref 1.005–1.030)

## 2017-06-15 LAB — BASIC METABOLIC PANEL
Anion gap: 9 (ref 5–15)
BUN: 17 mg/dL (ref 6–20)
CHLORIDE: 100 mmol/L — AB (ref 101–111)
CO2: 24 mmol/L (ref 22–32)
CREATININE: 0.78 mg/dL (ref 0.44–1.00)
Calcium: 9.5 mg/dL (ref 8.9–10.3)
GFR calc non Af Amer: >60 mL/min (ref >60)
Glucose, Bld: 229 mg/dL — ABNORMAL HIGH (ref 65–99)
Potassium: 4 mmol/L (ref 3.5–5.1)
Sodium: 133 mmol/L — ABNORMAL LOW (ref 135–145)

## 2017-06-15 LAB — CBG MONITORING, ED: Glucose-Capillary: 293 mg/dL — ABNORMAL HIGH (ref 65–99)

## 2017-06-15 MED ORDER — SODIUM CHLORIDE 0.9 % IV BOLUS (SEPSIS)
1000.0000 mL | Freq: Once | INTRAVENOUS | Status: AC
  Administered 2017-06-15: 1000 mL via INTRAVENOUS

## 2017-06-15 MED ORDER — BENZONATATE 100 MG PO CAPS: 100.0000 mg | ORAL_CAPSULE | Freq: Three times a day (TID) | ORAL | 0 refills | Status: DC | PRN

## 2017-06-15 NOTE — ED Notes (Signed)
Pt given d/c instructions as per chart. Rx x 1. Verbalizes understanding. No questions. 

## 2017-06-15 NOTE — ED Provider Notes (Signed)
[facility] EMERGENCY DEPARTMENT Provider Note   CSN: [id number] Arrival date & time: 06/15/17  1941     History   Chief Complaint Chief Complaint  Patient presents with  . Hyperglycemia    HPI Jill Holt is a 47 y.o. female.  HPI  47 year old female with a history of diabetes and hypertension presents with elevated glucose.  She states today when she checked her glucose it read "high".  She states it typically runs in the 200s.  She has been compliant with her high blood pressure and diabetes medicines.  She states she has had a cough with some nasal congestion for about 2 days.  No fevers or shortness of breath.  Some abdominal pain when coughing but no chest pain or shortness of breath.  She denies feeling dizzy, weak, or lightheaded.  No vomiting.  No myalgias.  She has a mild headache but no sore throat.  Past Medical History:  Diagnosis Date  . Diabetes mellitus without complication (HCC)   . Hypertension     There are no active problems to display for this patient.   Past Surgical History:  Procedure Laterality Date  . CARPAL TUNNEL RELEASE    . CESAREAN SECTION    . FEMUR FRACTURE SURGERY      OB History    No data available       Home Medications    Prior to Admission medications   Medication Sig Start Date End Date Taking? Authorizing Provider  AMLODIPINE BESYLATE PO Take by mouth.    [provider]  benzocaine (HURRICAINE) 20 % SOLN Use as directed 1 application in the mouth or throat.    [provider]  benzonatate (TESSALON) 100 MG capsule Take 1 capsule (100 mg total) by mouth 3 (three) times daily as needed for cough. 06/15/17   Pricilla Loveless, MD  Canagliflozin (INVOKANA PO) Take by mouth.    [provider]  doxycycline (VIBRA-TABS) 100 MG tablet Take 1 tablet (100 mg total) by mouth 2 (two) times daily. 07/04/14   [name], MD  HYDROcodone-acetaminophen (NORCO) 5-325 MG tablet Take 1-2 tablets by  mouth every 6 (six) hours as needed for severe pain. 01/29/16   Molpus, John, MD  HYDROcodone-homatropine [facility]) 5-1.5 MG/5ML syrup Take 5 mLs by mouth every 6 (six) hours as needed for cough. 07/04/14   [name], MD  LOSARTAN POTASSIUM PO Take by mouth.    [provider]  metFORMIN (GLUCOPHAGE) 1000 MG tablet Take 1,000 mg by mouth 2 (two) times daily with a meal.    [provider]  naproxen (NAPROSYN) 500 MG tablet Take one tablet twice daily as needed for knee pain. Best taken with a meal. 01/29/16   Molpus, Jonny Ruiz, MD  traMADol (ULTRAM) 50 MG tablet Take 1 tablet (50 mg total) by mouth every 6 (six) hours as needed. 01/25/17   Gerhard Munch, MD  valACYclovir (VALTREX) 1000 MG tablet Take 1,000 mg by mouth 2 (two) times daily.    [provider]    Family History History reviewed. No pertinent family history.  Social History Social History   Tobacco Use  . Smoking status: Never Smoker  . Smokeless tobacco: Never Used  Substance Use Topics  . Alcohol use: Yes    Comment: occasional  . Drug use: No     Allergies   Lisinopril   Review of Systems Review of Systems  Constitutional: Negative for fever.  HENT: Positive for rhinorrhea. Negative for sore throat.  Respiratory: Positive for cough. Negative for shortness of breath.   Cardiovascular: Negative for chest pain.  Gastrointestinal: Negative for vomiting.  Musculoskeletal: Negative for myalgias.  Neurological: Positive for headaches. Negative for dizziness, weakness and light-headedness.  All other systems reviewed and are negative.    Physical Exam Updated Vital Signs BP (!) 160/99   Pulse 80   Temp 99.1 F (37.3 C) (Oral)   Resp 16   Ht 5\' 3"  (1.6 m)   Wt 95.3 kg (210 lb)   LMP 05/30/2017   SpO2 99%   BMI 37.20 kg/m   Physical Exam  Constitutional: She is oriented to person, place, and time. She appears well-developed and well-nourished. No distress.  HENT:  Head:  Normocephalic and atraumatic.  Right Ear: External ear normal.  Left Ear: External ear normal.  Nose: Nose normal.  Mouth/Throat: Oropharynx is clear and moist. No oropharyngeal exudate.  Eyes: Right eye exhibits no discharge. Left eye exhibits no discharge.  Neck: Neck supple.  Cardiovascular: Normal rate, regular rhythm and normal heart sounds.  Pulmonary/Chest: Effort normal and breath sounds normal.  Abdominal: Soft. She exhibits no distension. There is no tenderness.  Neurological: She is alert and oriented to person, place, and time.  Skin: Skin is warm and dry. She is not diaphoretic.  Nursing note and vitals reviewed.    ED Treatments / Results  Labs (all labs ordered are listed, but only abnormal results are displayed) Labs Reviewed  BASIC METABOLIC PANEL - Abnormal; Notable for the following components:      Result Value   Sodium 133 (*)    Chloride 100 (*)    Glucose, Bld 229 (*)    All other components within normal limits  CBC - Abnormal; Notable for the following components:   WBC 3.5 (*)    RBC 5.20 (*)    All other components within normal limits  URINALYSIS, ROUTINE W REFLEX MICROSCOPIC - Abnormal; Notable for the following components:   Glucose, UA >=500 (*)    All other components within normal limits  URINALYSIS, MICROSCOPIC (REFLEX) - Abnormal; Notable for the following components:   Bacteria, UA RARE (*)    Squamous Epithelial / LPF 0-5 (*)    All other components within normal limits  CBG MONITORING, ED - Abnormal; Notable for the following components:   Glucose-Capillary 293 (*)    All other components within normal limits    EKG  EKG Interpretation None       Radiology Dg Chest 2 View  Result Date: 06/15/2017 CLINICAL DATA:  Cough and congestion for 3 days. EXAM: CHEST  2 VIEW COMPARISON:  None. FINDINGS: The cardiomediastinal contours are normal. The lungs are clear. Pulmonary vasculature is normal. No consolidation, pleural effusion, or  pneumothorax. No acute osseous abnormalities are seen. IMPRESSION: No acute pulmonary process. Electronically Signed   By: Rubye Oaks M.D.   On: 06/15/2017 23:17    Procedures Procedures (including critical care time)  Medications Ordered in ED Medications  sodium chloride 0.9 % bolus 1,000 mL (1,000 mLs Intravenous New Bag/Given 06/15/17 2244)     Initial Impression / Assessment and Plan / ED Course  I have reviewed the triage vital signs and the nursing notes.  Pertinent labs & imaging results that were available during my care of the patient were reviewed by me and considered in my medical decision making (see chart for details).     Patient overall appears well.  She is moderately hypertensive.  She has been  taking TheraFlu and was recommended to not take this as this could be contributing to her elevated blood pressure due to the phenylephrine component.  She will be given something else for cough.  However her chest x-ray shows no obvious pneumonia and she is not hypoxic or tachypneic here.  My suspicion for pneumonia or other severe illness such as influenza is low.  I think she is stable for discharge home.  She is only moderate hyperglycemia with glucose in the 200s and was given IV fluids.  Otherwise her electrolytes and workup are benign.  Discharge home with follow-up with PCP.  Return precautions.  Final Clinical Impressions(s) / ED Diagnoses   Final diagnoses:  Hyperglycemia  Upper respiratory infection with cough and congestion    ED Discharge Orders        Ordered    benzonatate (TESSALON) 100 MG capsule  3 times daily PRN     06/15/17 2327       Pricilla LovelessGoldston, Vineta Carone, MD 06/15/17 2335

## 2017-06-15 NOTE — ED Triage Notes (Signed)
Patient states that she has been sick lately and taking Theraflu - the patient states that her glucose machine at home will not read her sugar since it is so high

## 2018-09-28 DIAGNOSIS — U071 COVID-19: Secondary | ICD-10-CM

## 2018-09-28 HISTORY — DX: COVID-19: U07.1

## 2018-10-02 ENCOUNTER — Emergency Department (HOSPITAL_BASED_OUTPATIENT_CLINIC_OR_DEPARTMENT_OTHER): Payer: Managed Care, Other (non HMO)

## 2018-10-02 ENCOUNTER — Encounter (HOSPITAL_BASED_OUTPATIENT_CLINIC_OR_DEPARTMENT_OTHER): Payer: Self-pay | Admitting: *Deleted

## 2018-10-02 ENCOUNTER — Other Ambulatory Visit: Payer: Self-pay

## 2018-10-02 ENCOUNTER — Emergency Department (HOSPITAL_BASED_OUTPATIENT_CLINIC_OR_DEPARTMENT_OTHER)
Admission: EM | Admit: 2018-10-02 | Discharge: 2018-10-02 | Disposition: A | Discharge status: Home / Self Care | Payer: Managed Care, Other (non HMO) | Attending: Emergency Medicine | Admitting: Emergency Medicine

## 2018-10-02 DIAGNOSIS — U071 COVID-19: Secondary | ICD-10-CM | POA: Insufficient documentation

## 2018-10-02 DIAGNOSIS — R05 Cough: Secondary | ICD-10-CM | POA: Diagnosis not present

## 2018-10-02 DIAGNOSIS — Z7984 Long term (current) use of oral hypoglycemic drugs: Secondary | ICD-10-CM | POA: Diagnosis not present

## 2018-10-02 DIAGNOSIS — Z79899 Other long term (current) drug therapy: Secondary | ICD-10-CM | POA: Insufficient documentation

## 2018-10-02 DIAGNOSIS — E119 Type 2 diabetes mellitus without complications: Secondary | ICD-10-CM | POA: Diagnosis not present

## 2018-10-02 DIAGNOSIS — I10 Essential (primary) hypertension: Secondary | ICD-10-CM | POA: Insufficient documentation

## 2018-10-02 DIAGNOSIS — R509 Fever, unspecified: Secondary | ICD-10-CM | POA: Diagnosis present

## 2018-10-02 LAB — CBC WITH DIFFERENTIAL/PLATELET
Abs Immature Granulocytes: 0.01 10*3/uL (ref 0.00–0.07)
Basophils Absolute: 0 10*3/uL (ref 0.0–0.1)
Basophils Relative: 0 %
Eosinophils Absolute: 0 10*3/uL (ref 0.0–0.5)
Eosinophils Relative: 0 %
HCT: 43.4 % (ref 36.0–46.0)
Hemoglobin: 14.1 g/dL (ref 12.0–15.0)
Immature Granulocytes: 0 %
Lymphocytes Relative: 18 %
Lymphs Abs: 0.5 10*3/uL — ABNORMAL LOW (ref 0.7–4.0)
MCH: 25.6 pg — ABNORMAL LOW (ref 26.0–34.0)
MCHC: 32.5 g/dL (ref 30.0–36.0)
MCV: 78.9 fL — ABNORMAL LOW (ref 80.0–100.0)
Monocytes Absolute: 0.2 10*3/uL (ref 0.1–1.0)
Monocytes Relative: 6 %
Neutro Abs: 2.2 10*3/uL (ref 1.7–7.7)
Neutrophils Relative %: 76 %
Platelets: 143 10*3/uL — ABNORMAL LOW (ref 150–400)
RBC: 5.5 MIL/uL — ABNORMAL HIGH (ref 3.87–5.11)
RDW: 15.4 % (ref 11.5–15.5)
WBC: 2.9 10*3/uL — ABNORMAL LOW (ref 4.0–10.5)
nRBC: 0 % (ref 0.0–0.2)

## 2018-10-02 LAB — URINALYSIS, ROUTINE W REFLEX MICROSCOPIC
Bilirubin Urine: NEGATIVE
Glucose, UA: >=500 mg/dL — AB
Hgb urine dipstick: NEGATIVE
Ketones, ur: NEGATIVE mg/dL
Leukocytes,Ua: NEGATIVE
Nitrite: NEGATIVE
Protein, ur: 30 mg/dL — AB
Specific Gravity, Urine: 1.01 (ref 1.005–1.030)
pH: 6 (ref 5.0–8.0)

## 2018-10-02 LAB — URINALYSIS, MICROSCOPIC (REFLEX)

## 2018-10-02 LAB — COMPREHENSIVE METABOLIC PANEL
ALT: 30 U/L (ref 0–44)
AST: 35 U/L (ref 15–41)
Albumin: 3.8 g/dL (ref 3.5–5.0)
Alkaline Phosphatase: 79 U/L (ref 38–126)
Anion gap: 11 (ref 5–15)
BUN: 9 mg/dL (ref 6–20)
CO2: 27 mmol/L (ref 22–32)
Calcium: 8.7 mg/dL — ABNORMAL LOW (ref 8.9–10.3)
Chloride: 92 mmol/L — ABNORMAL LOW (ref 98–111)
Creatinine, Ser: 0.93 mg/dL (ref 0.44–1.00)
GFR calc Af Amer: >60 mL/min (ref >60)
GFR calc non Af Amer: >60 mL/min (ref >60)
Glucose, Bld: 423 mg/dL — ABNORMAL HIGH (ref 70–99)
Potassium: 4.1 mmol/L (ref 3.5–5.1)
Sodium: 130 mmol/L — ABNORMAL LOW (ref 135–145)
Total Bilirubin: 0.4 mg/dL (ref 0.3–1.2)
Total Protein: 7.7 g/dL (ref 6.5–8.1)

## 2018-10-02 LAB — PREGNANCY, URINE: Preg Test, Ur: NEGATIVE

## 2018-10-02 LAB — LACTIC ACID, PLASMA
Lactic Acid, Venous: 2.3 mmol/L (ref 0.5–1.9)
Lactic Acid, Venous: 1.2 mmol/L (ref 0.5–1.9)

## 2018-10-02 LAB — SARS CORONAVIRUS 2 AG (30 MIN TAT): SARS Coronavirus 2 Ag: POSITIVE — AB

## 2018-10-02 MED ORDER — IOHEXOL 300 MG/ML  SOLN
100.0000 mL | Freq: Once | INTRAMUSCULAR | Status: AC | PRN
  Administered 2018-10-02: 80 mL via INTRAVENOUS

## 2018-10-02 MED ORDER — ACETAMINOPHEN 500 MG PO TABS
1000.0000 mg | ORAL_TABLET | Freq: Once | ORAL | Status: AC
  Administered 2018-10-02: 1000 mg via ORAL

## 2018-10-02 MED ORDER — ACETAMINOPHEN 500 MG PO TABS
ORAL_TABLET | ORAL | Status: AC
  Administered 2018-10-02: 1000 mg via ORAL
  Filled 2018-10-02: qty 2

## 2018-10-02 MED ORDER — SODIUM CHLORIDE 0.9 % IV BOLUS
1000.0000 mL | Freq: Once | INTRAVENOUS | Status: AC
  Administered 2018-10-02: 1000 mL via INTRAVENOUS

## 2018-10-02 NOTE — ED Notes (Signed)
Date and time results received: 10/02/18 2223 (use smartphrase ".now" to insert current time)  Test: SARS Covid 19 Critical Value: positive  Name of Provider Notified: Albrizze PA, Dr. Jacqulyn Bath  Orders Received? Or Actions Taken?: no new orders

## 2018-10-02 NOTE — ED Notes (Signed)
Patient ambulates in room without desat.

## 2018-10-02 NOTE — ED Triage Notes (Signed)
Pt c/o fever cough x 2 days , seen by PMD x 3 with 2 neg covid , chest xray neg

## 2018-10-02 NOTE — ED Notes (Signed)
Date and time results received: 10/02/18 2042  Test: lactic acid Critical Value: 2.3  Name of Provider Notified: Dr. Jacqulyn Bath  Orders Received? Or Actions Taken?: no new orders

## 2018-10-02 NOTE — ED Notes (Signed)
X-ray at bedside

## 2018-10-02 NOTE — Discharge Instructions (Addendum)
You have been seen today for fever. Please read and follow all provided instructions. Return to the emergency room for worsening condition or new concerning symptoms.    Your COVID test today is positive.  As you know COVID is a viral infection nares no antibiotic you are for it.  The best treatment you can do is treat your symptoms, such as taking Tylenol for your fever.  -If you have worsening shortness of breath or fever you cant control please call your doctor or return to the emergency department.  You will need to self isolate until your fever goes away. If you need a negative test result in order to return to work please call your primary care doctor to ask about where to get one done.  1. Medications:  Continue usual home medications- Please take Metformin as directed. Take medications as prescribed. Please review all of the medicines and only take them if you do not have an allergy to them.   2. Treatment: rest, drink plenty of fluids. Continue to check your blood sugar daily.   3. Follow Up: Please follow up with your primary doctor in 2-5 days for discussion of your diagnoses and further evaluation after today's visit; Call today to arrange your follow up.   It is also a possibility that you have an allergic reaction to any of the medicines that you have been prescribed - Everybody reacts differently to medications and while MOST people have no trouble with most medicines, you may have a reaction such as nausea, vomiting, rash, swelling, shortness of breath. If this is the case, please stop taking the medicine immediately and contact your physician.

## 2018-10-02 NOTE — ED Provider Notes (Addendum)
[facility] EMERGENCY DEPARTMENT Provider Note   CSN: [id number] Arrival date & time: 10/02/18  1927    History   Chief Complaint Chief Complaint  Patient presents with   Fever    HPI Jill Holt is a 48 y.o. female medical history of hypertension diabetes presents emergency department today with chief complaint of fever x6 days.  She has been seen by primary care provider twice with a negative COVID test and also negative chest x-ray.  Patient states she also has a dry nonproductive cough since fever onset.  She has been taking Tylenol every 4 hours and states her fever will break but then returns.  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  She denies any nausea, shortness of breath, abdominal pain, vomiting, urinary symptoms, diarrhea, rash, headache.  History provided by patient with additional history obtained from chart review.     Past Medical History:  Diagnosis Date   Diabetes mellitus without complication (HCC)    Hypertension     There are no active problems to display for this patient.   Past Surgical History:  Procedure Laterality Date   CARPAL TUNNEL RELEASE     CESAREAN SECTION     FEMUR FRACTURE SURGERY       OB History   No obstetric history on file.      Home Medications    Prior to Admission medications   Medication Sig Start Date End Date Taking? Authorizing Provider  AMLODIPINE BESYLATE PO Take by mouth.    [provider]  benzocaine (HURRICAINE) 20 % SOLN Use as directed 1 application in the mouth or throat.    [provider]  benzonatate (TESSALON) 100 MG capsule Take 1 capsule (100 mg total) by mouth 3 (three) times daily as needed for cough. 06/15/17   Pricilla Loveless, MD  Canagliflozin (INVOKANA PO) Take by mouth.    [provider]  doxycycline (VIBRA-TABS) 100 MG tablet Take 1 tablet (100 mg  total) by mouth 2 (two) times daily. 07/04/14   [name], MD  HYDROcodone-acetaminophen (NORCO) 5-325 MG tablet Take 1-2 tablets by mouth every 6 (six) hours as needed for severe pain. 01/29/16   Molpus, John, MD  HYDROcodone-homatropine [facility]) 5-1.5 MG/5ML syrup Take 5 mLs by mouth every 6 (six) hours as needed for cough. 07/04/14   [name], MD  LOSARTAN POTASSIUM PO Take by mouth.    [provider]  metFORMIN (GLUCOPHAGE) 1000 MG tablet Take 1,000 mg by mouth 2 (two) times daily with a meal.    [provider]  naproxen (NAPROSYN) 500 MG tablet Take one tablet twice daily as needed for knee pain. Best taken with a meal. 01/29/16   Molpus, Jonny Ruiz, MD  traMADol (ULTRAM) 50 MG tablet Take 1 tablet (50 mg total) by mouth every 6 (six) hours as needed. 01/25/17   Gerhard Munch, MD  valACYclovir (VALTREX) 1000 MG tablet Take 1,000 mg by mouth 2 (two) times daily.    [provider]    Family History History reviewed. No pertinent family history.  Social History Social History   Tobacco Use   Smoking status: Never Smoker   Smokeless tobacco: Never Used  Substance Use Topics   Alcohol use: Yes    Comment: occasional   Drug use: No     Allergies   Lisinopril   Review of Systems Review of  Systems  Constitutional: Positive for fever. Negative for chills.  HENT: Negative for congestion, ear discharge, ear pain, sinus pressure, sinus pain and sore throat.   Eyes: Negative for pain and redness.  Respiratory: Positive for cough. Negative for shortness of breath.   Cardiovascular: Negative for chest pain.  Gastrointestinal: Negative for abdominal pain, constipation, diarrhea, nausea and vomiting.  Genitourinary: Negative for dysuria and hematuria.  Musculoskeletal: Negative for back pain and neck pain.  Skin: Negative for wound.  Neurological: Negative for weakness, numbness and headaches.     Physical Exam Updated Vital Signs BP (!) 152/93     Pulse (!) 103    Temp (!) 101.3 F (38.5 C) (Oral)    Resp 18    Ht 5\' 4"  (1.626 m)    Wt 93 kg    LMP 09/21/2018    SpO2 100%    BMI 35.19 kg/m   Physical Exam Vitals signs and nursing note reviewed.  Constitutional:      General: She is not in acute distress.    Appearance: She is not ill-appearing.     Comments: Patient feels warm to the touch.  She is in no acute distress.  HENT:     Head: Normocephalic and atraumatic.     Right Ear: Tympanic membrane and external ear normal.     Left Ear: Tympanic membrane and external ear normal.     Nose: Nose normal.     Mouth/Throat:     Mouth: Mucous membranes are moist.     Pharynx: Oropharynx is clear.  Eyes:     General: No scleral icterus.       Right eye: No discharge.        Left eye: No discharge.     Extraocular Movements: Extraocular movements intact.     Conjunctiva/sclera: Conjunctivae normal.     Pupils: Pupils are equal, round, and reactive to light.  Neck:     Musculoskeletal: Normal range of motion.     Vascular: No JVD.  Cardiovascular:     Rate and Rhythm: Regular rhythm. Tachycardia present.     Pulses: Normal pulses.          Radial pulses are 2+ on the right side and 2+ on the left side.     Heart sounds: Normal heart sounds.  Pulmonary:     Comments: Lung sounds diminished throughout. symmetric chest rise. No wheezing, rales, or rhonchi.  Normal work of breathing Abdominal:     Comments: Abdomen is soft, non-distended, and non-tender in all quadrants. No rigidity, no guarding. No peritoneal signs.  Musculoskeletal: Normal range of motion.  Skin:    General: Skin is warm and dry.     Capillary Refill: Capillary refill takes less than 2 seconds.  Neurological:     Mental Status: She is oriented to person, place, and time.     GCS: GCS eye subscore is 4. GCS verbal subscore is 5. GCS motor subscore is 6.     Comments: Fluent speech, no facial droop.  Psychiatric:        Behavior: Behavior normal.       ED Treatments / Results  Labs (all labs ordered are listed, but only abnormal results are displayed) Labs Reviewed  SARS CORONAVIRUS 2 (HOSP ORDER, PERFORMED IN [facility] VIA [facility] ID) - Abnormal; Notable for the following components:      Result Value   SARS Coronavirus 2 ([facility] ID Now) POSITIVE (*)    All other components  within normal limits  COMPREHENSIVE METABOLIC PANEL - Abnormal; Notable for the following components:   Sodium 130 (*)    Chloride 92 (*)    Glucose, Bld 423 (*)    Calcium 8.7 (*)    All other components within normal limits  LACTIC ACID, PLASMA - Abnormal; Notable for the following components:   Lactic Acid, Venous 2.3 (*)    All other components within normal limits  CBC WITH DIFFERENTIAL/PLATELET - Abnormal; Notable for the following components:   WBC 2.9 (*)    RBC 5.50 (*)    MCV 78.9 (*)    MCH 25.6 (*)    Platelets 143 (*)    Lymphs Abs 0.5 (*)    All other components within normal limits  URINALYSIS, ROUTINE W REFLEX MICROSCOPIC - Abnormal; Notable for the following components:   Glucose, UA >=500 (*)    Protein, ur 30 (*)    All other components within normal limits  URINALYSIS, MICROSCOPIC (REFLEX) - Abnormal; Notable for the following components:   Bacteria, UA RARE (*)    All other components within normal limits  CULTURE, BLOOD (ROUTINE X 2)  CULTURE, BLOOD (ROUTINE X 2)  LACTIC ACID, PLASMA  PREGNANCY, URINE    EKG None  Radiology Ct Chest W Contrast  Result Date: 10/02/2018 CLINICAL DATA:  Acute respiratory illness.  Cough and fever. EXAM: CT CHEST WITH CONTRAST TECHNIQUE: Multidetector CT imaging of the chest was performed during intravenous contrast administration. CONTRAST:  80mL OMNIPAQUE IOHEXOL 300 MG/ML  SOLN COMPARISON:  None. FINDINGS: Cardiovascular: No significant vascular findings. Normal heart size. No pericardial effusion. Mediastinum/Nodes: There is a 1.3 cm thyroid nodule involving the right hemi thyroid.  There are no pathologically enlarged mediastinal or hilar lymph nodes. No pathologically enlarged axillary or supraclavicular lymph nodes. Lungs/Pleura: There are multifocal airspace opacities bilaterally. The largest area of consolidation is within the right lower lobe. Trachea is unremarkable. There may be a few cavitary or cystic components within the right lower lobe consolidation. There are other subtle lucent areas within the remaining airspace opacities bilaterally which could represent developing cavitation. There is no pneumothorax. No large pleural effusion. Upper Abdomen: No acute abnormality. Musculoskeletal: No chest wall abnormality. No acute or significant osseous findings. IMPRESSION: 1. Multifocal airspace opacities bilaterally with a larger area of consolidation involving the right lower lobe. Findings are concerning for multifocal pneumonia (viral or bacterial). There are a few subtle lucent areas within some of these airspace opacities raising suspicion for septic emboli. 2. No large pleural effusion or pneumothorax. 3. A 1.3 cm thyroid nodule is visualized on the right. Follow-up with nonemergent outpatient thyroid ultrasound is recommended for further evaluation. Electronically Signed   By: Katherine Mantlehristopher  Green M.D.   On: 10/02/2018 21:31   Dg Chest Port 1 View  Result Date: 10/02/2018 CLINICAL DATA:  Cough and fever EXAM: PORTABLE CHEST 1 VIEW COMPARISON:  September 28, 2018 FINDINGS: No edema or consolidation. Heart size and pulmonary vascularity are normal. No adenopathy. No bone lesions. IMPRESSION: No edema or consolidation. Electronically Signed   By: Bretta BangWilliam  Woodruff III M.D.   On: 10/02/2018 20:42    Procedures Procedures (including critical care time)  Medications Ordered in ED Medications  acetaminophen (TYLENOL) tablet 1,000 mg (1,000 mg Oral Given 10/02/18 1940)  iohexol (OMNIPAQUE) 300 MG/ML solution 100 mL (80 mLs Intravenous Contrast Given 10/02/18 2103)  sodium chloride 0.9 %  bolus 1,000 mL (1,000 mLs Intravenous New Bag/Given 10/02/18 2225)     Initial  Impression / Assessment and Plan / ED Course  I have reviewed the triage vital signs and the nursing notes.  Pertinent labs & imaging results that were available during my care of the patient were reviewed by me and considered in my medical decision making (see chart for details).  48 yo female presents with fever and cough x 6 days.  Arrival she is febrile to 101.3 and tachycardic to 103.  She was given dose of Tylenol for her fever on arrival.  Lung sounds are diminished throughout.  She meets sepsis criteria with fever, heart rate, WBC count of 2.9. Lactic acid is elevated to 2.3.  CMP also shows hyperglycemia of 423 without an elevated anion gap, DKA unlikely.  UA is without signs of infection.  Portable chest x-ray viewed by me is negative for infiltrate or signs of infection.  However given duration of patient's cough and fever, CT chest ordered and is concerning for pneumonia with multifocal airspace opacities bilaterally.  Covid test resulted after CT scan and is positive. Patient ambulated without difficulty and on room air SpO2 stayed above 95%.  Repeat lactic acid of 1.2.  Pt's temperature and tachycardia improved on reassessment. She can ambulate without shortness of breath and has SpO2 >95% on room air, we feel she can be discharged home to continue symptomatic management. She has prescription for metformin at home, recommend she restart taking it as her glucose today is elevated to 423. Evaluation does not show pathology that would require ongoing emergent intervention or inpatient treatment. I explained the diagnosis to the patient.  Patient is comfortable with above plan and is stable for discharge at this time. All questions were answered prior to disposition. Strict return precautions for returning to the ED were discussed. Encouraged follow up with PCP. Findings and plan of care discussed with supervising  physician Dr. Jacqulyn Bath.   Vitals:   10/02/18 1934 10/02/18 1936 10/02/18 2230  BP: (!) 152/93  119/80  Pulse: (!) 103  94  Resp: 18  20  Temp:  (!) 101.3 F (38.5 C) 100.3 F (37.9 C)  TempSrc:  Oral Oral  SpO2: 100%  95%  Weight: 93 kg    Height:  (1.626 m)       Jill Holt was evaluated in Emergency Department on 10/02/2018 for the symptoms described in the history of present illness. She was evaluated in the context of the global COVID-19 pandemic, which necessitated consideration that the patient might be at risk for infection with the SARS-CoV-2 virus that causes COVID-19. Institutional protocols and algorithms that pertain to the evaluation of patients at risk for COVID-19 are in a state of rapid change based on information released by regulatory bodies including the CDC and federal and state organizations. These policies and algorithms were followed during the patient's care in the ED.  This note was prepared using Dragon voice recognition software and may include unintentional dictation errors due to the inherent limitations of voice recognition software.    Final Clinical Impressions(s) / ED Diagnoses   Final diagnoses:  Fever  COVID-19    ED Discharge Orders    None       Sherene Sires, PA-C 10/02/18 2321    Sherene Sires, PA-C 10/02/18 2323    Maia Plan, MD 10/05/18 [phone]

## 2018-10-02 NOTE — ED Notes (Signed)
Patient transported to CT 

## 2018-10-07 LAB — CULTURE, BLOOD (ROUTINE X 2)
Culture: NO GROWTH
Special Requests: ADEQUATE

## 2018-10-08 LAB — CULTURE, BLOOD (ROUTINE X 2)
Culture: NO GROWTH
Special Requests: ADEQUATE

## 2018-10-18 ENCOUNTER — Emergency Department (HOSPITAL_BASED_OUTPATIENT_CLINIC_OR_DEPARTMENT_OTHER)
Admission: EM | Admit: 2018-10-18 | Discharge: 2018-10-18 | Disposition: A | Discharge status: Home / Self Care | Payer: Managed Care, Other (non HMO) | Attending: Emergency Medicine | Admitting: Emergency Medicine

## 2018-10-18 ENCOUNTER — Emergency Department (HOSPITAL_BASED_OUTPATIENT_CLINIC_OR_DEPARTMENT_OTHER): Payer: Managed Care, Other (non HMO)

## 2018-10-18 ENCOUNTER — Other Ambulatory Visit: Payer: Self-pay

## 2018-10-18 ENCOUNTER — Encounter (HOSPITAL_BASED_OUTPATIENT_CLINIC_OR_DEPARTMENT_OTHER): Payer: Self-pay | Admitting: Emergency Medicine

## 2018-10-18 DIAGNOSIS — R059 Cough, unspecified: Secondary | ICD-10-CM

## 2018-10-18 DIAGNOSIS — R0602 Shortness of breath: Secondary | ICD-10-CM | POA: Diagnosis not present

## 2018-10-18 DIAGNOSIS — U071 COVID-19: Secondary | ICD-10-CM | POA: Insufficient documentation

## 2018-10-18 DIAGNOSIS — Z79899 Other long term (current) drug therapy: Secondary | ICD-10-CM | POA: Insufficient documentation

## 2018-10-18 DIAGNOSIS — R05 Cough: Secondary | ICD-10-CM | POA: Diagnosis present

## 2018-10-18 DIAGNOSIS — E1165 Type 2 diabetes mellitus with hyperglycemia: Secondary | ICD-10-CM | POA: Insufficient documentation

## 2018-10-18 DIAGNOSIS — E041 Nontoxic single thyroid nodule: Secondary | ICD-10-CM | POA: Insufficient documentation

## 2018-10-18 DIAGNOSIS — Z7982 Long term (current) use of aspirin: Secondary | ICD-10-CM | POA: Diagnosis not present

## 2018-10-18 DIAGNOSIS — R739 Hyperglycemia, unspecified: Secondary | ICD-10-CM

## 2018-10-18 DIAGNOSIS — R030 Elevated blood-pressure reading, without diagnosis of hypertension: Secondary | ICD-10-CM

## 2018-10-18 DIAGNOSIS — I10 Essential (primary) hypertension: Secondary | ICD-10-CM | POA: Insufficient documentation

## 2018-10-18 LAB — CBC WITH DIFFERENTIAL/PLATELET
Abs Immature Granulocytes: 0.02 10*3/uL (ref 0.00–0.07)
Basophils Absolute: 0 10*3/uL (ref 0.0–0.1)
Basophils Relative: 1 %
Eosinophils Absolute: 0 10*3/uL (ref 0.0–0.5)
Eosinophils Relative: 1 %
HCT: 40.2 % (ref 36.0–46.0)
Hemoglobin: 12.9 g/dL (ref 12.0–15.0)
Immature Granulocytes: 1 %
Lymphocytes Relative: 27 %
Lymphs Abs: 1.1 10*3/uL (ref 0.7–4.0)
MCH: 25.8 pg — ABNORMAL LOW (ref 26.0–34.0)
MCHC: 32.1 g/dL (ref 30.0–36.0)
MCV: 80.4 fL (ref 80.0–100.0)
Monocytes Absolute: 0.5 10*3/uL (ref 0.1–1.0)
Monocytes Relative: 12 %
Neutro Abs: 2.5 10*3/uL (ref 1.7–7.7)
Neutrophils Relative %: 58 %
Platelets: 244 10*3/uL (ref 150–400)
RBC: 5 MIL/uL (ref 3.87–5.11)
RDW: 14.4 % (ref 11.5–15.5)
WBC: 4.2 10*3/uL (ref 4.0–10.5)
nRBC: 0 % (ref 0.0–0.2)

## 2018-10-18 LAB — COMPREHENSIVE METABOLIC PANEL
ALT: 27 U/L (ref 0–44)
AST: 19 U/L (ref 15–41)
Albumin: 3.2 g/dL — ABNORMAL LOW (ref 3.5–5.0)
Alkaline Phosphatase: 62 U/L (ref 38–126)
Anion gap: 11 (ref 5–15)
BUN: 10 mg/dL (ref 6–20)
CO2: 26 mmol/L (ref 22–32)
Calcium: 9.1 mg/dL (ref 8.9–10.3)
Chloride: 96 mmol/L — ABNORMAL LOW (ref 98–111)
Creatinine, Ser: 0.65 mg/dL (ref 0.44–1.00)
GFR calc Af Amer: >60 mL/min (ref >60)
GFR calc non Af Amer: >60 mL/min (ref >60)
Glucose, Bld: 316 mg/dL — ABNORMAL HIGH (ref 70–99)
Potassium: 4.1 mmol/L (ref 3.5–5.1)
Sodium: 133 mmol/L — ABNORMAL LOW (ref 135–145)
Total Bilirubin: 0.7 mg/dL (ref 0.3–1.2)
Total Protein: 7.2 g/dL (ref 6.5–8.1)

## 2018-10-18 LAB — PREGNANCY, URINE: Preg Test, Ur: NEGATIVE

## 2018-10-18 LAB — D-DIMER, QUANTITATIVE: D-Dimer, Quant: 0.39 ug/mL-FEU (ref 0.00–0.50)

## 2018-10-18 MED ORDER — ALBUTEROL SULFATE HFA 108 (90 BASE) MCG/ACT IN AERS
2.0000 | INHALATION_SPRAY | Freq: Once | RESPIRATORY_TRACT | Status: AC
  Administered 2018-10-18: 2 via RESPIRATORY_TRACT
  Filled 2018-10-18: qty 6.7

## 2018-10-18 MED ORDER — HYDROCODONE-HOMATROPINE 5-1.5 MG/5ML PO SYRP: 5.0000 mL | ORAL_SOLUTION | Freq: Four times a day (QID) | ORAL | 0 refills | Status: DC | PRN

## 2018-10-18 MED ORDER — SODIUM CHLORIDE 0.9 % IV BOLUS
500.0000 mL | Freq: Once | INTRAVENOUS | Status: AC
  Administered 2018-10-18: 500 mL via INTRAVENOUS

## 2018-10-18 NOTE — ED Provider Notes (Signed)
[facility] EMERGENCY DEPARTMENT Provider Note   CSN: [id number] Arrival date & time: 10/18/18  1305     History   Chief Complaint Chief Complaint  Patient presents with  . Shortness of Breath    HPI Jill Holt is a 48 y.o. female.     HPI   Patient is a 4 or female past medical history of type 2 diabetes mellitus on Lantus and metformin, hypertension presenting for cough and shortness of breath.  Patient was diagnosed with COVID-19 on 6-5- 2020 after a prodrome of cough, fever and shortness of breath ever since 09-27-2018.  Patient reports that she has not been febrile since she was last seen in the emergency department 16 days ago however she continues to have cough and shortness of breath.  She denies any chills or rigors. She feels more short of breath when she is having a coughing spell.  She reports that she feels that there is mucus caught in her throat but she has not had any cough productive of sputum or hemoptysis.  Patient does report some shortness of breath with going up stairs as well.  She denies any chest pain.  Patient denies any headaches, congestion, rhinorrhea, sore throat, abdominal pain, nausea, vomiting.  She has had some soft stool.  She denies any lower extremity edema or calf tenderness.  She denies any dysuria, urgency, or frequency.  Patient denies any history of DVT/PE or hormone use.  Patient reports that she has been taking Tessalon Perles prescribed by her PCP without full relief.  Past Medical History:  Diagnosis Date  . Diabetes mellitus without complication ([place])   . Hypertension     There are no active problems to display for this patient.   Past Surgical History:  Procedure Laterality Date  . CARPAL TUNNEL RELEASE    . CESAREAN SECTION    . FEMUR FRACTURE SURGERY       OB History   No obstetric history on file.      Home Medications    Prior to Admission medications   Medication Sig Start Date End Date Taking?  Authorizing Provider  aspirin 81 MG chewable tablet Chew by mouth. 12/19/15 02/08/20 Yes [provider]  omeprazole (PRILOSEC) 20 MG capsule Take by mouth. 02/05/18  Yes [provider]  rosuvastatin (CRESTOR) 20 MG tablet Take by mouth. 12/18/16  Yes [provider]  AMLODIPINE BESYLATE PO Take by mouth.    [provider]  benzocaine (HURRICAINE) 20 % SOLN Use as directed 1 application in the mouth or throat.    [provider]  benzonatate (TESSALON) 100 MG capsule Take 1 capsule (100 mg total) by mouth 3 (three) times daily as needed for cough. 06/15/17   Sherwood Gambler, MD  Canagliflozin (INVOKANA PO) Take by mouth.    [provider]  doxycycline (VIBRA-TABS) 100 MG tablet Take 1 tablet (100 mg total) by mouth 2 (two) times daily. 07/04/14   Leonard Schwartz, MD  FEROSUL 325 (65 Fe) MG tablet  06/24/18   [provider]  HYDROcodone-acetaminophen (NORCO) 5-325 MG tablet Take 1-2 tablets by mouth every 6 (six) hours as needed for severe pain. 01/29/16   Molpus, John, MD  HYDROcodone-homatropine [facility]) 5-1.5 MG/5ML syrup Take 5 mLs by mouth every 6 (six) hours as needed for cough. 07/04/14   Leonard Schwartz, MD  LANTUS SOLOSTAR 100 UNIT/ML Solostar Pen ADM 10 UNI Outlook Q NIGHT 07/15/18   [provider]  LOSARTAN POTASSIUM PO Take  by mouth.    [provider]  metFORMIN (GLUCOPHAGE) 1000 MG tablet Take 1,000 mg by mouth 2 (two) times daily with a meal.    [provider]  naproxen (NAPROSYN) 500 MG tablet Take one tablet twice daily as needed for knee pain. Best taken with a meal. 01/29/16   Molpus, Jonny RuizJohn, MD  traMADol (ULTRAM) 50 MG tablet Take 1 tablet (50 mg total) by mouth every 6 (six) hours as needed. 01/25/17   Gerhard MunchLockwood, Robert, MD  valACYclovir (VALTREX) 1000 MG tablet Take 1,000 mg by mouth 2 (two) times daily.    [provider]    Family History History reviewed. No pertinent family history.   Social History Social History   Tobacco Use  . Smoking status: Never Smoker  . Smokeless tobacco: Never Used  Substance Use Topics  . Alcohol use: Yes    Comment: occasional  . Drug use: No     Allergies   Lisinopril   Review of Systems Review of Systems  Constitutional: Negative for chills and fever.  HENT: Negative for congestion, rhinorrhea, sinus pain and sore throat.   Eyes: Negative for visual disturbance.  Respiratory: Positive for cough and shortness of breath. Negative for chest tightness.   Cardiovascular: Negative for chest pain, palpitations and leg swelling.  Gastrointestinal: Negative for abdominal pain, diarrhea, nausea and vomiting.  Endocrine: Negative for polyuria.  Genitourinary: Negative for dysuria and flank pain.  Musculoskeletal: Negative for back pain and myalgias.  Skin: Negative for rash.  Neurological: Negative for headaches.     Physical Exam Updated Vital Signs BP (!) 152/98 (BP Location: Left Arm)   Pulse (!) 104   Temp 98.4 F (36.9 C) (Oral)   Resp (!) 22   Ht 5\' 4"  (1.626 m)   Wt 93 kg   LMP 09/21/2018   SpO2 100%   BMI 35.19 kg/m   Physical Exam Vitals signs and nursing note reviewed.  Constitutional:      General: She is not in acute distress.    Appearance: She is well-developed. She is not ill-appearing or diaphoretic.  HENT:     Head: Normocephalic and atraumatic.  Eyes:     Conjunctiva/sclera: Conjunctivae normal.     Pupils: Pupils are equal, round, and reactive to light.  Neck:     Musculoskeletal: Normal range of motion and neck supple.  Cardiovascular:     Rate and Rhythm: Normal rate and regular rhythm.     Pulses:          Dorsalis pedis pulses are 2+ on the right side and 2+ on the left side.     Heart sounds: S1 normal and S2 normal. No murmur.     Comments: No LE edema. No calf tenderness.  Pulmonary:     Effort: Pulmonary effort is normal. No tachypnea, accessory muscle usage or respiratory distress.      Breath sounds: Examination of the right-middle field reveals rhonchi. Examination of the left-middle field reveals rhonchi. Rhonchi present. No wheezing or rales.     Comments: Coughs frequently during exam.  Abdominal:     General: There is no distension.     Palpations: Abdomen is soft.     Tenderness: There is no abdominal tenderness. There is no guarding.  Musculoskeletal: Normal range of motion.        General: No deformity.  Lymphadenopathy:     Cervical: No cervical adenopathy.  Skin:    General: Skin is warm and dry.  Findings: No erythema or rash.  Neurological:     Mental Status: She is alert.     Comments: Cranial nerves grossly intact. Patient moves extremities symmetrically and with good coordination.  Psychiatric:        Behavior: Behavior normal.        Thought Content: Thought content normal.        Judgment: Judgment normal.      ED Treatments / Results  Labs (all labs ordered are listed, but only abnormal results are displayed) Labs Reviewed  COMPREHENSIVE METABOLIC PANEL - Abnormal; Notable for the following components:      Result Value   Sodium 133 (*)    Chloride 96 (*)    Glucose, Bld 316 (*)    Albumin 3.2 (*)    All other components within normal limits  CBC WITH DIFFERENTIAL/PLATELET - Abnormal; Notable for the following components:   MCH 25.8 (*)    All other components within normal limits  D-DIMER, QUANTITATIVE (NOT AT [facility])  PREGNANCY, URINE    EKG    Radiology Dg Chest Portable 1 View  Result Date: 10/18/2018 CLINICAL DATA:  Cough, fever and shortness of breath for several weeks. EXAM: PORTABLE CHEST 1 VIEW COMPARISON:  10/02/2018 CT, chest radiograph and prior studies FINDINGS: The cardiomediastinal silhouette is unremarkable. There is no evidence of focal airspace disease, pulmonary edema, suspicious pulmonary nodule/mass, pleural effusion, or pneumothorax. No acute bony abnormalities are identified. IMPRESSION: No active  disease. Electronically Signed   By: Harmon PierJeffrey  Hu M.D.   On: 10/18/2018 14:30    Procedures Procedures (including critical care time)  Medications Ordered in ED Medications  albuterol (VENTOLIN HFA) 108 (90 Base) MCG/ACT inhaler 2 puff (has no administration in time range)     Initial Impression / Assessment and Plan / ED Course  I have reviewed the triage vital signs and the nursing notes.  Pertinent labs & imaging results that were available during my care of the patient were reviewed by me and considered in my medical decision making (see chart for details).  Clinical Course as of Oct 17 1549  Sun Oct 18, 2018  1410 Improvement in leukopenia and thrombocytopenia.   CBC with Differential(!) [AM]  1424 HR now in 80s-90s.   [AM]  1431 Likely 2/2 hyperglycemia.   Sodium(!): 133 [AM]  1517 Feels better. Reviewed results and plan.    [AM]    Clinical Course User Index [AM] Elisha PonderMurray, Nils Thor B, PA-C        This is a 48-year female the past medical history of type 2 diabetes and hypertension presenting for ongoing cough and shortness of breath after diagnosis of COVID-19 6 days ago.  Fevers have resolved.  On exam, patient is able to speak in full sentences, and does not appear to use accessory muscles.  She does cough frequently during exam.  Differential diagnosis for shortness breath includes ongoing complication from COVID-19, pulmonary embolism a result of the prothrombotic state, DKA.  Will assess basic labs, chest x-ray, d-dimer to risk stratify patient. Case discussed with Dr. Raeford RazorStephen Kohut.  Patient has resolution of leukopenia and thrombocytopenia.  CMP with hyperglycemia and hyponatremia of 133 c/w hyperglycemia.  She was given 500 mL of fluid.  No evidence of DKA.    Records reviewed from 2 weeks ago when pt had chest CT w/ contrast. Showed multifocal PNA c/w atypical pneumonia. There was mention of possible septic emboli. Patient denies IVDU and has normal dentition.   The  patient's  previous CT scan was reviewed with Dr. Si GaulHu of radiology who states that patient did have some peripheral findings that were questionable for septic emboli however they can be found with COVID-19.  He did state that the lobar appearing infiltrate on CT scan last time could also be bacterial.  Patient does have clinical improvement with no evidence of ongoing infiltrate on chest x-ray, resolution of fevers, negative blood cultures.  Do not feel that any antimicrobial therapy is warranted at this time.  Appreciate his involvement.  Incidentally, patient also had a right thyroid nodule noted on her CT scan from 2 weeks ago.  She was informed of this result.  She is instructed to follow-up with primary care for an ultrasound as an outpatient.  Patient was given cough syrup as well as albuterol inhaler for symptom control.I have reviewed the patient's information in the [place] Controlled Substance Database for the past 12 months and found them to have no Rx in 2 years. Opiate cough syrup was prescribed for ongoing symptom control of cough and she was instructed to use sparingly.   Patient was given return precautions for any worsening cough, shortness of breath or return of fevers.  Patient is in understanding and agrees to the plan of care.  Jill Holt was evaluated in Emergency Department on 10/18/2018 for the symptoms described in the history of present illness. She was evaluated in the context of the global COVID-19 pandemic, which necessitated consideration that the patient might be at risk for infection with the SARS-CoV-2 virus that causes COVID-19. Institutional protocols and algorithms that pertain to the evaluation of patients at risk for COVID-19 are in a state of rapid change based on information released by regulatory bodies including the CDC and federal and state organizations. These policies and algorithms were followed during the patient's care in the ED.   This is a  supervised visit with Dr. Raeford RazorStephen Kohut. Evaluation, management, and discharge planning discussed with this attending physician.  Final Clinical Impressions(s) / ED Diagnoses   Final diagnoses:  Cough  Thyroid nodule  Hyperglycemia  Elevated blood pressure reading    ED Discharge Orders         Ordered    HYDROcodone-homatropine (HYCODAN) 5-1.5 MG/5ML syrup  Every 6 hours PRN     10/18/18 1559           Elisha PonderMurray, Kalecia Hartney B, PA-C 10/18/18 1624    Raeford RazorKohut, Stephen, MD 10/19/18 [phone]

## 2018-10-18 NOTE — Discharge Instructions (Addendum)
Please read and follow all provided instructions.  Your diagnoses today include:  1. Cough   2. Thyroid nodule   3. Hyperglycemia     You appear to have an upper respiratory infection (URI). An upper respiratory tract infection, or cold, is a viral infection of the air passages leading to the lungs. It should improve gradually after 5-7 days. You may have a lingering cough that lasts for 2- 4 weeks after the infection.  Tests performed today include: Vital signs. See below for your results today.   Your lab work shows improvement in some of the findings we normally see with COVID-19 such as low white blood cell count.  I reviewed your CT scan from 2 weeks ago.  It made note of a thyroid nodule.  This will need to be followed by your primary care provider.  These are very common and often need an outpatient ultrasound.  Medications prescribed:   Take any prescribed medications only as directed. Treatment for your infection is aimed at treating the symptoms. There are no medications, such as antibiotics, that will cure your infection.   You are prescribed Hycodan for cough. This contains a narcotic. Use sparingly.   Do not combine this medication with alcohol.  Please be advised to avoid driving or operating heavy machinery while taking this medication, as it may make you drowsy or impair judgment.   Please take 1 to 2 puffs of your albuterol inhaler every 4-6 hours as needed for cough and shortness of breath.  Home care instructions:  Follow any educational materials contained in this packet.   Your illness is contagious and can be spread to others, especially during the first 3 or 4 days. It cannot be cured by antibiotics or other medicines. Take basic precautions such as washing your hands often, covering your mouth when you cough or sneeze, and avoiding public places where you could spread your illness to others.   Please continue drinking plenty of fluids.  Use over-the-counter  medicines as needed as directed on packaging for symptom relief.  You may also use ibuprofen or tylenol as directed on packaging for pain or fever.  Do not take multiple medicines containing Tylenol or acetaminophen to avoid taking too much of this medication.  Follow-up instructions: Please follow-up with your primary care provider in the next 3 days for further evaluation of your symptoms if you are not feeling better.   Return instructions:  Please return to the Emergency Department if you experience worsening symptoms.  RETURN IMMEDIATELY IF you develop shortness of breath, chest pain, confusion or altered mental status, a new rash, become dizzy, faint, or poorly responsive, or are unable to be cared for at home. Please return if you have persistent vomiting and cannot keep down fluids or develop a fever that is not controlled by tylenol or motrin.   Please return if you have any other emergent concerns.  Additional Information:  Your vital signs today were: BP (!) 136/99    Pulse 84    Temp 98.4 F (36.9 C) (Oral)    Resp 10    Ht 5\' 4"  (1.626 m)    Wt 93 kg    LMP 09/21/2018    SpO2 98%    BMI 35.19 kg/m  If your blood pressure (BP) was elevated above 135/85 this visit, please have this repeated by your doctor within one month. --------------

## 2018-10-18 NOTE — ED Triage Notes (Signed)
Patient states she has had cough, fever, and SOB since May 31st - patient tested positive for covid the last time she was here. She continues to have cough, SOB and fever

## 2021-04-06 IMAGING — CT CT CHEST WITH CONTRAST
2 of 3 series · 15 of 36 positions shown, 18 images · IV contrast (omnipaque)
Comparison: None.

CLINICAL DATA: Acute respiratory illness.  Cough and fever.

EXAM:
CT CHEST WITH CONTRAST
TECHNIQUE: Multidetector CT imaging of the chest was performed during
intravenous contrast administration.
CONTRAST:  80mL OMNIPAQUE IOHEXOL 300 MG/ML  SOLN

[Series 2: axial st · axial · 0.90mm/px · z∈[-250,+4]mm · 12 of 151 slices shown, 15 images]
[im 12/151  mediastinal]
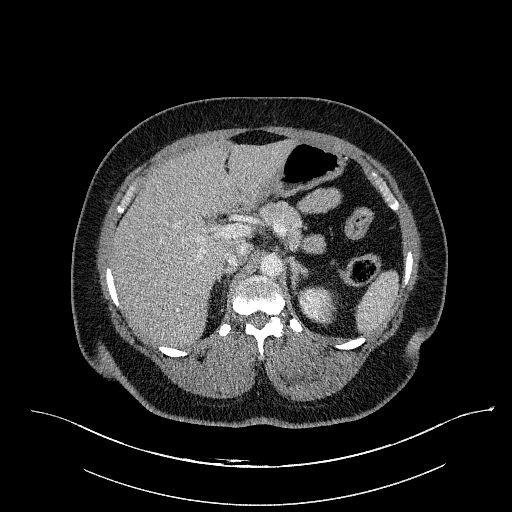
[im 12/151  lung]
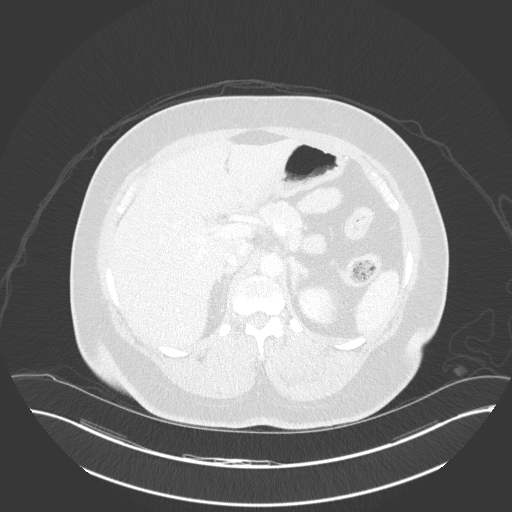
[im 23/151  lung]
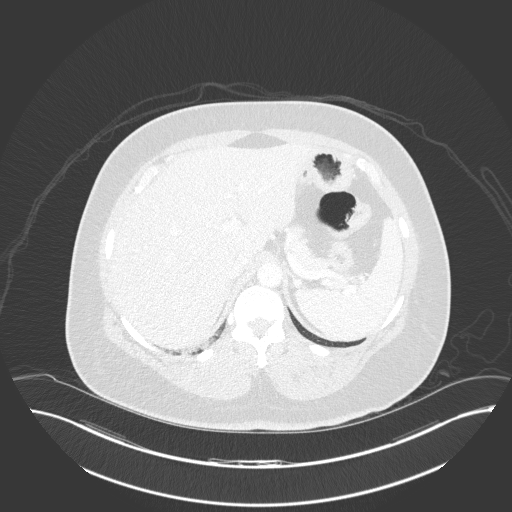
[im 34/151  lung]
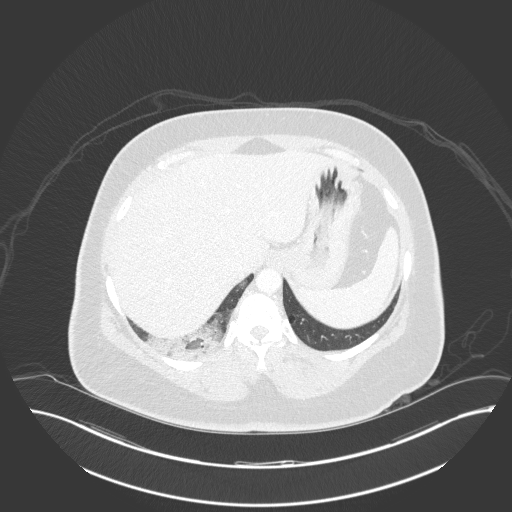
[im 45/151  lung]
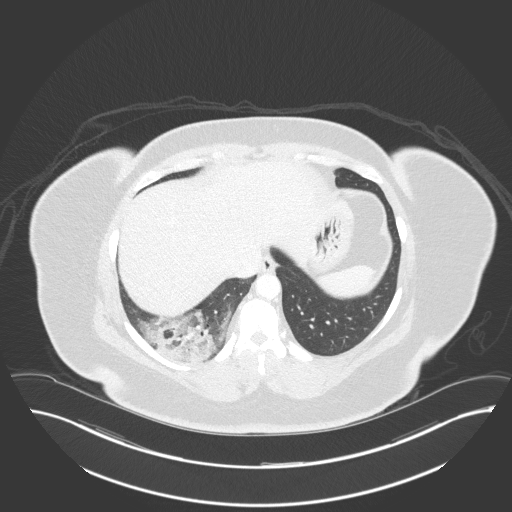
[im 56/151  mediastinal]
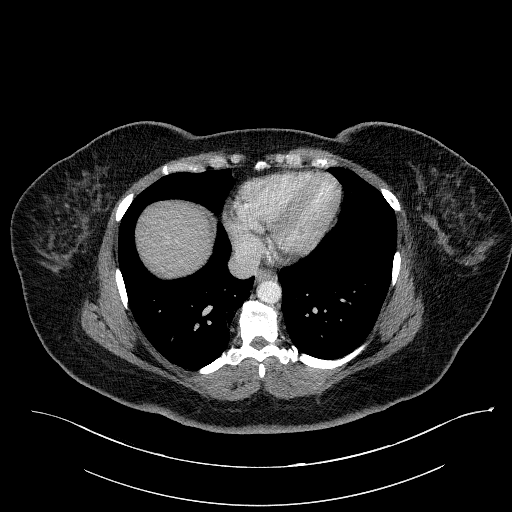
[im 56/151  lung]
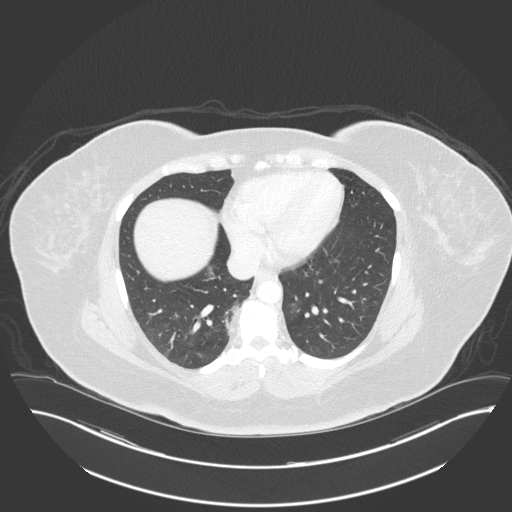
[im 67/151  lung]
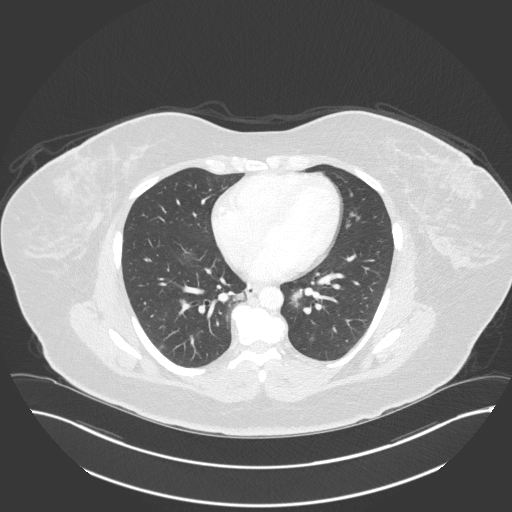
[im 84/151  lung]
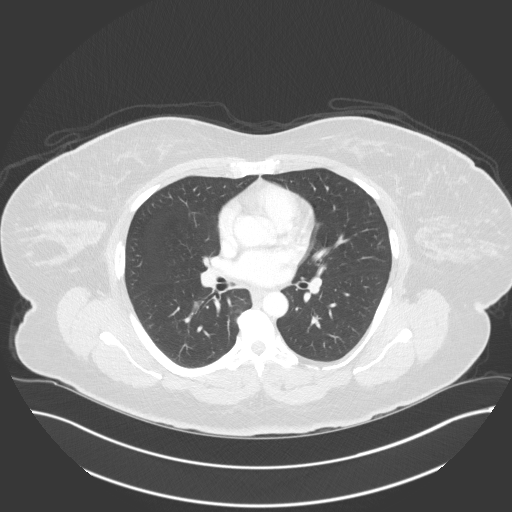
[im 95/151  lung]
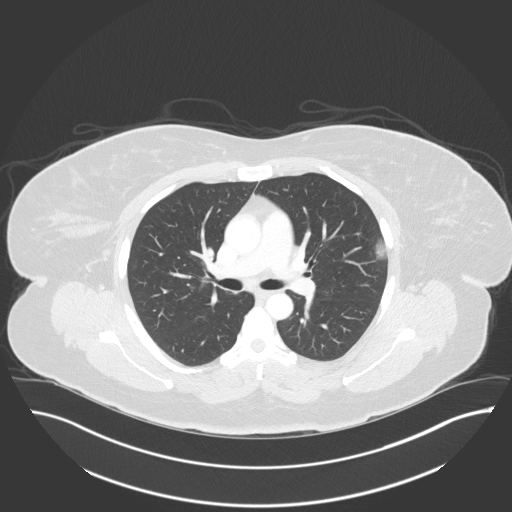
[im 106/151  mediastinal]
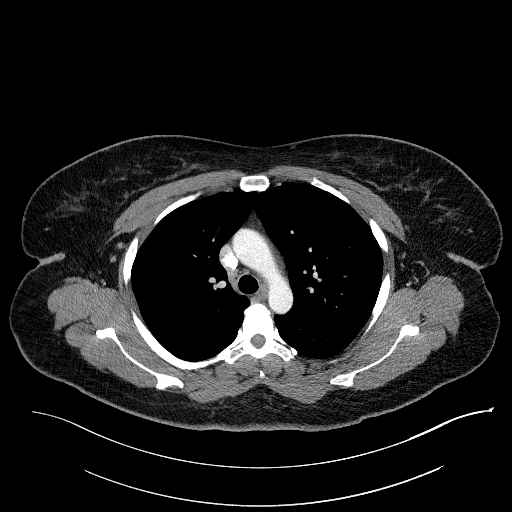
[im 106/151  lung]
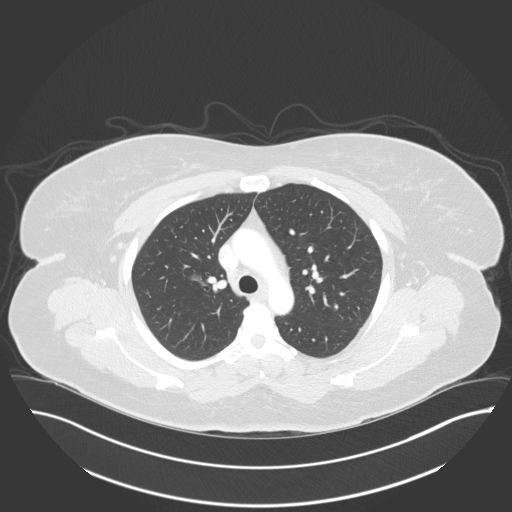
[im 117/151  lung]
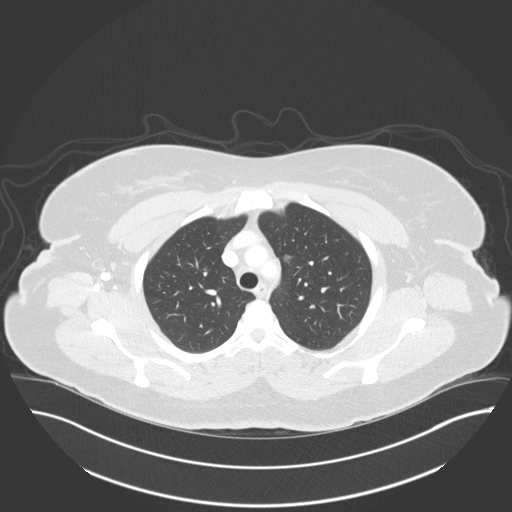
[im 128/151  lung]
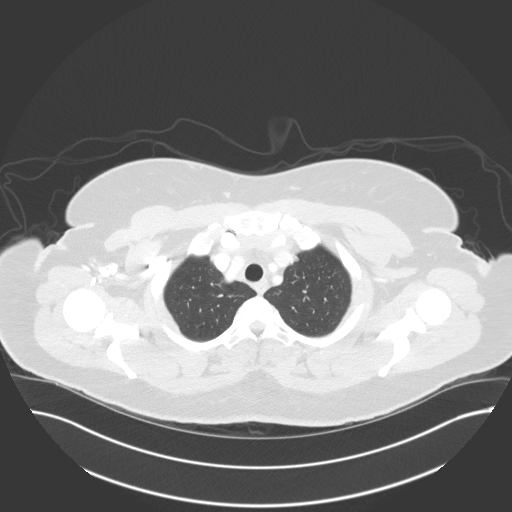
[im 139/151  lung]
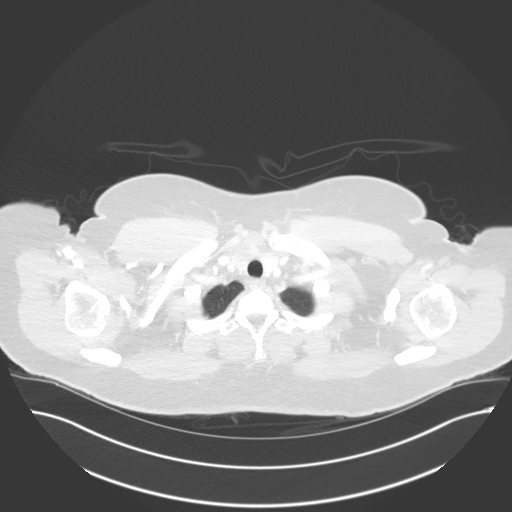

[Series 5: coronal · coronal · 0.61mm/px · 3 of 151 slices shown]
[im 31/151  lung]
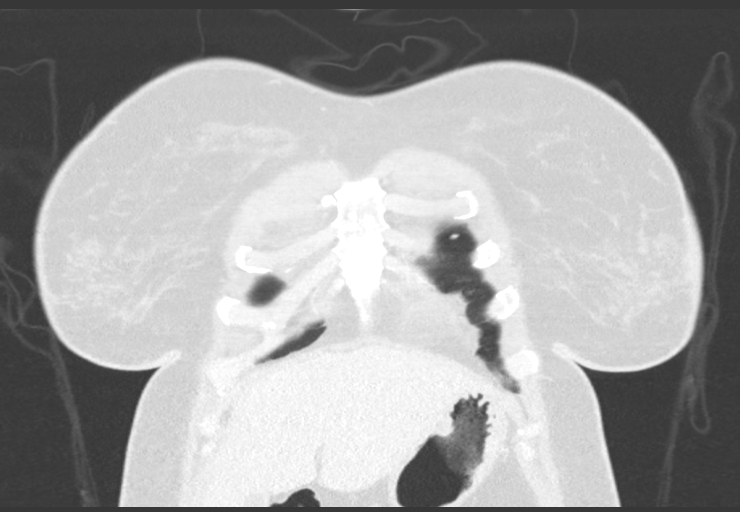
[im 61/151  lung]
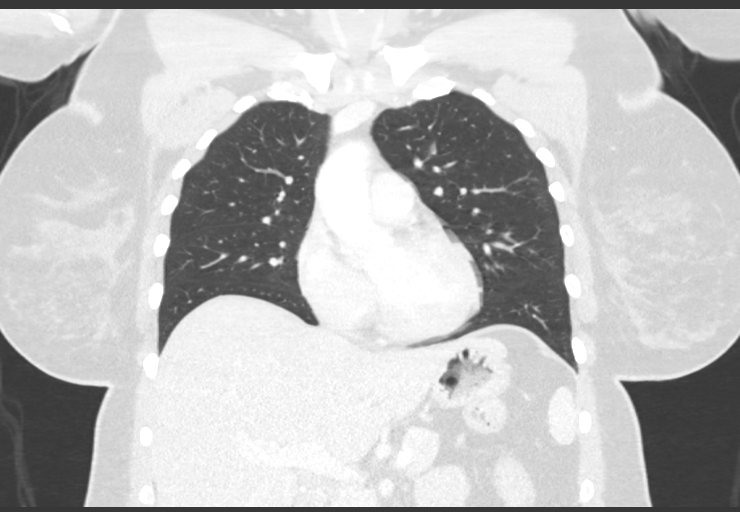
[im 91/151  lung]
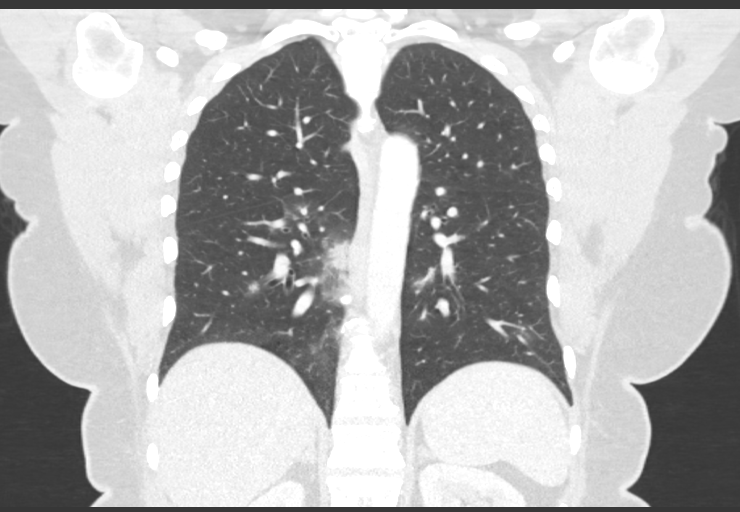

[15 of 36 positions shown; findings below may reference images not displayed]

FINDINGS: Cardiovascular: No significant vascular findings. Normal heart size.
No pericardial effusion.

Mediastinum/Nodes: There is a 1.3 cm thyroid nodule involving the
right hemi thyroid. There are no pathologically enlarged mediastinal
or hilar lymph nodes. No pathologically enlarged axillary or
supraclavicular lymph nodes.

Lungs/Pleura: There are multifocal airspace opacities bilaterally.
The largest area of consolidation is within the right lower lobe.
Trachea is unremarkable. There may be a few cavitary or cystic
components within the right lower lobe consolidation. There are
other subtle lucent areas within the remaining airspace opacities
bilaterally which could represent developing cavitation. There is no
pneumothorax. No large pleural effusion.

Upper Abdomen: No acute abnormality.

Musculoskeletal: No chest wall abnormality. No acute or significant
osseous findings.
IMPRESSION: 1. Multifocal airspace opacities bilaterally with a larger area of
consolidation involving the right lower lobe. Findings are
concerning for multifocal pneumonia (viral or bacterial). There are
a few subtle lucent areas within some of these airspace opacities
raising suspicion for septic emboli.
2. No large pleural effusion or pneumothorax.
3. A 1.3 cm thyroid nodule is visualized on the right. Follow-up
with nonemergent outpatient thyroid ultrasound is recommended for
further evaluation.

## 2021-04-22 IMAGING — DX PORTABLE CHEST - 1 VIEW
1 series · 1 of 1 positions shown · non-contrast
Comparison: 10/02/2018 CT, chest radiograph and prior studies

CLINICAL DATA: Cough, fever and shortness of breath for several
weeks.

EXAM:
PORTABLE CHEST 1 VIEW

[chest ap]
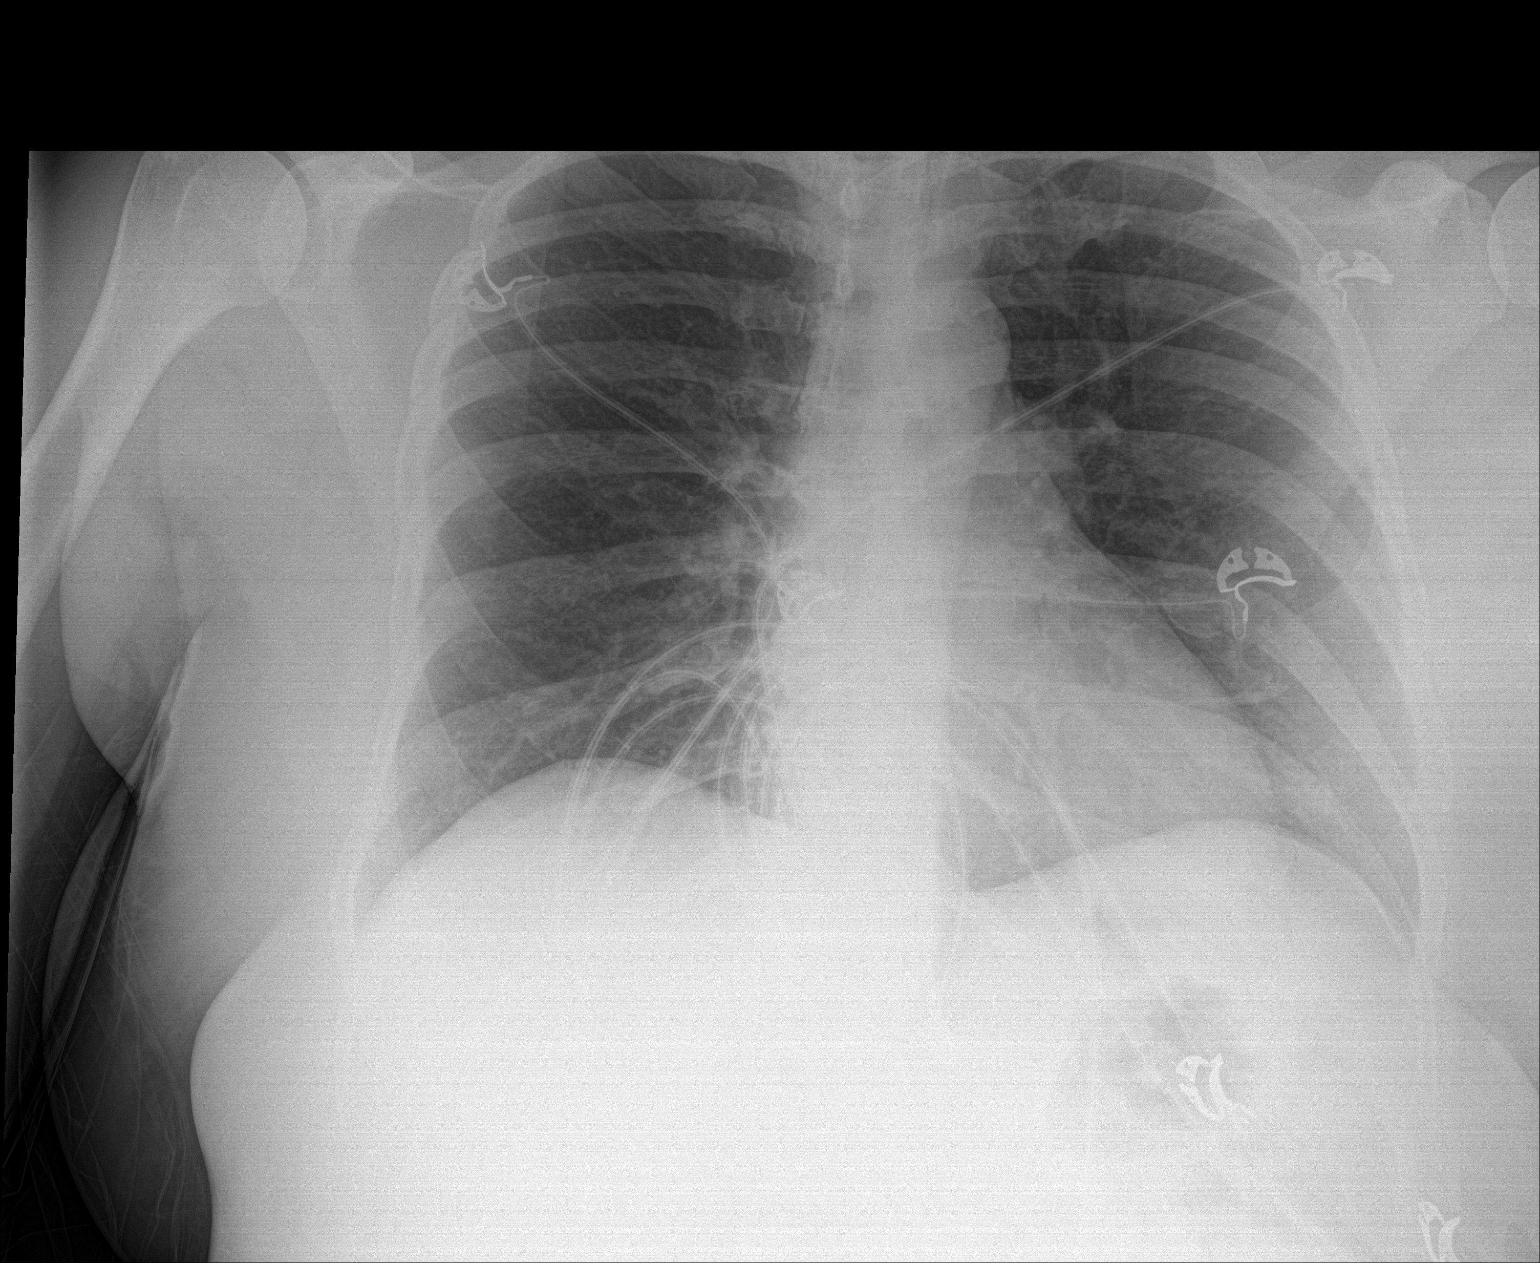

[1 of 1 positions shown; findings below may reference images not displayed]

FINDINGS: The cardiomediastinal silhouette is unremarkable.

There is no evidence of focal airspace disease, pulmonary edema,
suspicious pulmonary nodule/mass, pleural effusion, or pneumothorax.

No acute bony abnormalities are identified.
IMPRESSION: No active disease.

## 2022-07-22 ENCOUNTER — Other Ambulatory Visit: Payer: Self-pay | Admitting: Neurosurgery

## 2022-07-23 ENCOUNTER — Encounter (HOSPITAL_COMMUNITY): Payer: Self-pay | Admitting: Neurosurgery

## 2022-07-23 ENCOUNTER — Other Ambulatory Visit: Payer: Self-pay

## 2022-07-23 NOTE — Progress Notes (Signed)
[facility] - Preparing for Surgery  Before surgery, you can play an important role.  Because skin is not sterile, your skin needs to be as free of germs as possible.  You can reduce the number of germs on you skin by washing with CHG (chlorahexidine gluconate) soap before surgery.  CHG is an antiseptic cleaner which kills germs and bonds with the skin to continue killing germs even after washing.  Oral Hygiene is also important in reducing the risk of infection.  Remember to brush your teeth with your regular toothpaste the morning of surgery.  Please DO NOT use if you have an allergy to CHG or antibacterial soaps.  If your skin becomes reddened/irritated stop using the CHG and inform your nurse when you arrive at Short Stay.  Do not shave (including legs and underarms) for at least 48 hours prior to the first CHG shower.  You may shave your face.  Please follow these instructions carefully:   1.  Shower with CHG Soap the night before surgery and the morning of Surgery.  2.  If you choose to wash your hair, wash your hair first as usual with your normal shampoo.  3.  After you shampoo, rinse your hair and body thoroughly to remove the shampoo. 4.  Use CHG as you would any other liquid soap.  You can apply chg directly to the skin and wash gently with a      scrungie or washcloth.           5.  Apply the CHG Soap to your body ONLY FROM THE NECK DOWN.   Do not use on open wounds or open sores. Avoid contact with your eyes, ears, mouth and genitals (private parts).  Wash genitals (private parts) with your normal soap.  6.  Wash thoroughly, paying special attention to the area where your surgery will be performed.  7.  Thoroughly rinse your body with warm water from the neck down.  8.  DO NOT shower/wash with your normal soap after using and rinsing off the CHG Soap.  9.  Pat yourself dry with a clean towel.            10.  Wear clean pajamas.            11.  Place clean sheets on your bed the  night of your first shower and do not sleep with pets.  Day of Surgery  Shower as above.  Do not apply any powders,lotions, perfumes or deodorants the morning of surgery.   Please wear clean clothes to the hospital. Remember to brush your teeth with toothpaste.

## 2022-07-23 NOTE — Progress Notes (Signed)
Spoke with pt for pre-op call. Pt denies cardiac history. Pt is diabetic. Last A1C was 10.7 on 07/14/22. Instructed pt not take her Glipizide tonight and in the AM. Instructed her to take 70% of her regular dose of Insulin 70/30 tonight and not to take any in the AM. Instructed her to check her blood sugar when she wakes up and every 2 hours until she leaves for the hospital. If blood sugar is 70 or below, treat with 1/2 cup of clear juice (apple or cranberry) and recheck blood sugar 15 minutes after drinking juice. If blood sugar continues to be 70 or below, call the Short Stay department and ask to speak to a nurse.  Pt is coming to pick up the CHG soap and instructions.

## 2022-07-24 ENCOUNTER — Ambulatory Visit (HOSPITAL_COMMUNITY)
Admission: RE | Disposition: A | Discharge status: Home / Self Care | Payer: Self-pay | Source: Home / Self Care | Attending: Neurosurgery

## 2022-07-24 ENCOUNTER — Encounter (HOSPITAL_COMMUNITY): Payer: Self-pay | Admitting: Neurosurgery

## 2022-07-24 ENCOUNTER — Ambulatory Visit (HOSPITAL_COMMUNITY): Payer: BLUE CROSS/BLUE SHIELD | Admitting: Physician Assistant

## 2022-07-24 ENCOUNTER — Ambulatory Visit (HOSPITAL_COMMUNITY): Payer: BLUE CROSS/BLUE SHIELD

## 2022-07-24 ENCOUNTER — Ambulatory Visit (HOSPITAL_COMMUNITY)
Admission: RE | Admit: 2022-07-24 | Discharge: 2022-07-25 | Disposition: A | Discharge status: Home / Self Care | Payer: BLUE CROSS/BLUE SHIELD | Attending: Neurosurgery | Admitting: Neurosurgery

## 2022-07-24 DIAGNOSIS — M48061 Spinal stenosis, lumbar region without neurogenic claudication: Secondary | ICD-10-CM | POA: Diagnosis not present

## 2022-07-24 DIAGNOSIS — K219 Gastro-esophageal reflux disease without esophagitis: Secondary | ICD-10-CM | POA: Insufficient documentation

## 2022-07-24 DIAGNOSIS — I10 Essential (primary) hypertension: Secondary | ICD-10-CM | POA: Diagnosis not present

## 2022-07-24 DIAGNOSIS — Z79899 Other long term (current) drug therapy: Secondary | ICD-10-CM | POA: Insufficient documentation

## 2022-07-24 DIAGNOSIS — Z7984 Long term (current) use of oral hypoglycemic drugs: Secondary | ICD-10-CM | POA: Diagnosis not present

## 2022-07-24 DIAGNOSIS — Z794 Long term (current) use of insulin: Secondary | ICD-10-CM | POA: Insufficient documentation

## 2022-07-24 DIAGNOSIS — E119 Type 2 diabetes mellitus without complications: Secondary | ICD-10-CM

## 2022-07-24 DIAGNOSIS — Z01818 Encounter for other preprocedural examination: Secondary | ICD-10-CM

## 2022-07-24 HISTORY — DX: Anemia, unspecified: D64.9

## 2022-07-24 HISTORY — PX: LUMBAR LAMINECTOMY/DECOMPRESSION MICRODISCECTOMY: SHX5026

## 2022-07-24 HISTORY — DX: Pneumonia, unspecified organism: J18.9

## 2022-07-24 HISTORY — DX: Gastro-esophageal reflux disease without esophagitis: K21.9

## 2022-07-24 HISTORY — DX: Unspecified osteoarthritis, unspecified site: M19.90

## 2022-07-24 LAB — BASIC METABOLIC PANEL
Anion gap: 13 (ref 5–15)
BUN: 14 mg/dL (ref 6–20)
CO2: 26 mmol/L (ref 22–32)
Calcium: 9.3 mg/dL (ref 8.9–10.3)
Chloride: 94 mmol/L — ABNORMAL LOW (ref 98–111)
Creatinine, Ser: 0.87 mg/dL (ref 0.44–1.00)
GFR, Estimated: >60 mL/min (ref >60)
Glucose, Bld: 331 mg/dL — ABNORMAL HIGH (ref 70–99)
Potassium: 3.7 mmol/L (ref 3.5–5.1)
Sodium: 133 mmol/L — ABNORMAL LOW (ref 135–145)

## 2022-07-24 LAB — CBC
HCT: 33.1 % — ABNORMAL LOW (ref 36.0–46.0)
Hemoglobin: 10.1 g/dL — ABNORMAL LOW (ref 12.0–15.0)
MCH: 19.7 pg — ABNORMAL LOW (ref 26.0–34.0)
MCHC: 30.5 g/dL (ref 30.0–36.0)
MCV: 64.5 fL — ABNORMAL LOW (ref 80.0–100.0)
Platelets: 248 10*3/uL (ref 150–400)
RBC: 5.13 MIL/uL — ABNORMAL HIGH (ref 3.87–5.11)
RDW: 18.7 % — ABNORMAL HIGH (ref 11.5–15.5)
WBC: 4.8 10*3/uL (ref 4.0–10.5)
nRBC: 0 % (ref 0.0–0.2)

## 2022-07-24 LAB — SURGICAL PCR SCREEN
MRSA, PCR: NEGATIVE
Staphylococcus aureus: NEGATIVE

## 2022-07-24 LAB — GLUCOSE, CAPILLARY
Glucose-Capillary: 311 mg/dL — ABNORMAL HIGH (ref 70–99)
Glucose-Capillary: 237 mg/dL — ABNORMAL HIGH (ref 70–99)
Glucose-Capillary: 196 mg/dL — ABNORMAL HIGH (ref 70–99)
Glucose-Capillary: 313 mg/dL — ABNORMAL HIGH (ref 70–99)
Glucose-Capillary: 280 mg/dL — ABNORMAL HIGH (ref 70–99)

## 2022-07-24 LAB — HEMOGLOBIN A1C
Hgb A1c MFr Bld: 11 % — ABNORMAL HIGH (ref 4.8–5.6)
Mean Plasma Glucose: 269 mg/dL

## 2022-07-24 LAB — POCT PREGNANCY, URINE: Preg Test, Ur: NEGATIVE

## 2022-07-24 SURGERY — LUMBAR LAMINECTOMY/DECOMPRESSION MICRODISCECTOMY 2 LEVELS
Anesthesia: General | Site: Back | Laterality: Left

## 2022-07-24 MED ORDER — SODIUM CHLORIDE 0.9 % IV SOLN: 250.0000 mL | INTRAVENOUS | Status: DC

## 2022-07-24 MED ORDER — HYDROMORPHONE HCL 1 MG/ML IJ SOLN: 0.5000 mg | INTRAMUSCULAR | Status: DC | PRN

## 2022-07-24 MED ORDER — CEFAZOLIN SODIUM-DEXTROSE 2-3 GM-%(50ML) IV SOLR
INTRAVENOUS | Status: DC | PRN
  Administered 2022-07-24: 2 g via INTRAVENOUS

## 2022-07-24 MED ORDER — CYCLOBENZAPRINE HCL 10 MG PO TABS
10.0000 mg | ORAL_TABLET | Freq: Three times a day (TID) | ORAL | Status: DC | PRN
  Administered 2022-07-24 – 2022-07-25 (×3): 10 mg via ORAL
  Filled 2022-07-24 (×3): qty 1

## 2022-07-24 MED ORDER — ALBUMIN HUMAN 5 % IV SOLN: INTRAVENOUS | Status: DC | PRN

## 2022-07-24 MED ORDER — LACTATED RINGERS IV SOLN: INTRAVENOUS | Status: DC

## 2022-07-24 MED ORDER — ONDANSETRON HCL 4 MG/2ML IJ SOLN: 4.0000 mg | Freq: Once | INTRAMUSCULAR | Status: AC

## 2022-07-24 MED ORDER — AMLODIPINE BESYLATE 10 MG PO TABS
10.0000 mg | ORAL_TABLET | Freq: Every morning | ORAL | Status: DC
  Administered 2022-07-25: 10 mg via ORAL
  Filled 2022-07-24: qty 1

## 2022-07-24 MED ORDER — SODIUM CHLORIDE 0.9% FLUSH
3.0000 mL | Freq: Two times a day (BID) | INTRAVENOUS | Status: DC
  Administered 2022-07-25: 3 mL via INTRAVENOUS

## 2022-07-24 MED ORDER — AMISULPRIDE (ANTIEMETIC) 5 MG/2ML IV SOLN: 10.0000 mg | Freq: Once | INTRAVENOUS | Status: DC | PRN

## 2022-07-24 MED ORDER — VITAMIN B-12 1000 MCG PO TABS
1000.0000 ug | ORAL_TABLET | Freq: Every morning | ORAL | Status: DC
  Administered 2022-07-25: 1000 ug via ORAL
  Filled 2022-07-24: qty 1

## 2022-07-24 MED ORDER — BUPIVACAINE HCL (PF) 0.25 % IJ SOLN
INTRAMUSCULAR | Status: DC | PRN
  Administered 2022-07-24: 10 mL

## 2022-07-24 MED ORDER — ACETAMINOPHEN 500 MG PO TABS: 1000.0000 mg | ORAL_TABLET | Freq: Once | ORAL | Status: AC

## 2022-07-24 MED ORDER — MEDROXYPROGESTERONE ACETATE 10 MG PO TABS
10.0000 mg | ORAL_TABLET | Freq: Every morning | ORAL | Status: DC
  Administered 2022-07-25: 10 mg via ORAL
  Filled 2022-07-24: qty 1

## 2022-07-24 MED ORDER — PROPOFOL 10 MG/ML IV BOLUS
INTRAVENOUS | Status: DC | PRN
  Administered 2022-07-24: 170 mg via INTRAVENOUS

## 2022-07-24 MED ORDER — SCOPOLAMINE 1 MG/3DAYS TD PT72
MEDICATED_PATCH | TRANSDERMAL | Status: AC
  Administered 2022-07-24: 1.5 mg via TRANSDERMAL
  Filled 2022-07-24: qty 1

## 2022-07-24 MED ORDER — SODIUM CHLORIDE 0.9% FLUSH: 3.0000 mL | INTRAVENOUS | Status: DC | PRN

## 2022-07-24 MED ORDER — 0.9 % SODIUM CHLORIDE (POUR BTL) OPTIME
TOPICAL | Status: DC | PRN
  Administered 2022-07-24: 1000 mL

## 2022-07-24 MED ORDER — PHENYLEPHRINE HCL-NACL 20-0.9 MG/250ML-% IV SOLN
INTRAVENOUS | Status: DC | PRN
  Administered 2022-07-24: 30 ug/min via INTRAVENOUS

## 2022-07-24 MED ORDER — PHENOL 1.4 % MT LIQD: 1.0000 | OROMUCOSAL | Status: DC | PRN

## 2022-07-24 MED ORDER — INSULIN ASPART 100 UNIT/ML IJ SOLN
0.0000 [IU] | Freq: Three times a day (TID) | INTRAMUSCULAR | Status: DC
  Administered 2022-07-24: 11 [IU] via SUBCUTANEOUS

## 2022-07-24 MED ORDER — INSULIN ASPART PROT & ASPART (70-30 MIX) 100 UNIT/ML ~~LOC~~ SUSP
35.0000 [IU] | Freq: Every day | SUBCUTANEOUS | Status: DC
  Administered 2022-07-25: 35 [IU] via SUBCUTANEOUS
  Filled 2022-07-24: qty 10

## 2022-07-24 MED ORDER — CEFAZOLIN SODIUM 1 G IJ SOLR
INTRAMUSCULAR | Status: AC
  Filled 2022-07-24: qty 20

## 2022-07-24 MED ORDER — LIDOCAINE 2% (20 MG/ML) 5 ML SYRINGE
INTRAMUSCULAR | Status: DC | PRN
  Administered 2022-07-24: 100 mg via INTRAVENOUS

## 2022-07-24 MED ORDER — ONDANSETRON HCL 4 MG PO TABS: 4.0000 mg | ORAL_TABLET | Freq: Four times a day (QID) | ORAL | Status: DC | PRN

## 2022-07-24 MED ORDER — ACETAMINOPHEN 325 MG PO TABS: 650.0000 mg | ORAL_TABLET | ORAL | Status: DC | PRN

## 2022-07-24 MED ORDER — CHLORHEXIDINE GLUCONATE 0.12 % MT SOLN
15.0000 mL | Freq: Once | OROMUCOSAL | Status: AC
  Administered 2022-07-24: 15 mL via OROMUCOSAL
  Filled 2022-07-24: qty 15

## 2022-07-24 MED ORDER — LIDOCAINE-EPINEPHRINE 1 %-1:100000 IJ SOLN
INTRAMUSCULAR | Status: AC
  Filled 2022-07-24: qty 1

## 2022-07-24 MED ORDER — HYDROCHLOROTHIAZIDE 12.5 MG PO TABS
12.5000 mg | ORAL_TABLET | Freq: Every day | ORAL | Status: DC
  Administered 2022-07-25: 12.5 mg via ORAL
  Filled 2022-07-24: qty 1

## 2022-07-24 MED ORDER — GLIPIZIDE 5 MG PO TABS
10.0000 mg | ORAL_TABLET | Freq: Two times a day (BID) | ORAL | Status: DC
  Administered 2022-07-24: 10 mg via ORAL
  Filled 2022-07-24: qty 2

## 2022-07-24 MED ORDER — FENTANYL CITRATE (PF) 100 MCG/2ML IJ SOLN
25.0000 ug | INTRAMUSCULAR | Status: DC | PRN
  Administered 2022-07-24: 50 ug via INTRAVENOUS

## 2022-07-24 MED ORDER — PROPOFOL 10 MG/ML IV BOLUS
INTRAVENOUS | Status: AC
  Filled 2022-07-24: qty 20

## 2022-07-24 MED ORDER — BUPIVACAINE HCL (PF) 0.25 % IJ SOLN
INTRAMUSCULAR | Status: AC
  Filled 2022-07-24: qty 30

## 2022-07-24 MED ORDER — PANTOPRAZOLE SODIUM 40 MG IV SOLR: 40.0000 mg | Freq: Every day | INTRAVENOUS | Status: DC

## 2022-07-24 MED ORDER — LOSARTAN POTASSIUM-HCTZ 100-12.5 MG PO TABS: 1.0000 | ORAL_TABLET | Freq: Every morning | ORAL | Status: DC

## 2022-07-24 MED ORDER — SCOPOLAMINE 1 MG/3DAYS TD PT72: 1.0000 | MEDICATED_PATCH | TRANSDERMAL | Status: DC

## 2022-07-24 MED ORDER — DEXAMETHASONE SODIUM PHOSPHATE 10 MG/ML IJ SOLN
INTRAMUSCULAR | Status: DC | PRN
  Administered 2022-07-24: 10 mg via INTRAVENOUS

## 2022-07-24 MED ORDER — FENTANYL CITRATE (PF) 250 MCG/5ML IJ SOLN
INTRAMUSCULAR | Status: DC | PRN
  Administered 2022-07-24: 100 ug via INTRAVENOUS

## 2022-07-24 MED ORDER — SUGAMMADEX SODIUM 200 MG/2ML IV SOLN
INTRAVENOUS | Status: DC | PRN
  Administered 2022-07-24: 200 mg via INTRAVENOUS

## 2022-07-24 MED ORDER — KETOROLAC TROMETHAMINE 10 MG PO TABS: 10.0000 mg | ORAL_TABLET | Freq: Four times a day (QID) | ORAL | Status: DC | PRN

## 2022-07-24 MED ORDER — LIDOCAINE-EPINEPHRINE 1 %-1:100000 IJ SOLN
INTRAMUSCULAR | Status: DC | PRN
  Administered 2022-07-24: 10 mL

## 2022-07-24 MED ORDER — MENTHOL 3 MG MT LOZG: 1.0000 | LOZENGE | OROMUCOSAL | Status: DC | PRN

## 2022-07-24 MED ORDER — INSULIN ASPART PROT & ASPART (70-30 MIX) 100 UNIT/ML ~~LOC~~ SUSP
25.0000 [IU] | Freq: Every day | SUBCUTANEOUS | Status: DC
  Administered 2022-07-24: 25 [IU] via SUBCUTANEOUS
  Filled 2022-07-24 (×2): qty 10

## 2022-07-24 MED ORDER — ONDANSETRON HCL 4 MG/2ML IJ SOLN
INTRAMUSCULAR | Status: DC | PRN
  Administered 2022-07-24: 4 mg via INTRAVENOUS

## 2022-07-24 MED ORDER — GLIPIZIDE 5 MG PO TABS
10.0000 mg | ORAL_TABLET | Freq: Two times a day (BID) | ORAL | Status: DC
  Administered 2022-07-25: 10 mg via ORAL
  Filled 2022-07-24: qty 2

## 2022-07-24 MED ORDER — PROMETHAZINE HCL 25 MG/ML IJ SOLN: 6.2500 mg | INTRAMUSCULAR | Status: DC | PRN

## 2022-07-24 MED ORDER — ACETAMINOPHEN 650 MG RE SUPP: 650.0000 mg | RECTAL | Status: DC | PRN

## 2022-07-24 MED ORDER — ROCURONIUM BROMIDE 10 MG/ML (PF) SYRINGE
PREFILLED_SYRINGE | INTRAVENOUS | Status: DC | PRN
  Administered 2022-07-24: 80 mg via INTRAVENOUS

## 2022-07-24 MED ORDER — INSULIN ASPART 100 UNIT/ML IJ SOLN
0.0000 [IU] | INTRAMUSCULAR | Status: AC | PRN
  Administered 2022-07-24: 10 [IU] via SUBCUTANEOUS
  Administered 2022-07-24: 6 [IU] via SUBCUTANEOUS
  Filled 2022-07-24: qty 1

## 2022-07-24 MED ORDER — FENTANYL CITRATE (PF) 100 MCG/2ML IJ SOLN
INTRAMUSCULAR | Status: AC
  Filled 2022-07-24: qty 2

## 2022-07-24 MED ORDER — PHENYLEPHRINE 80 MCG/ML (10ML) SYRINGE FOR IV PUSH (FOR BLOOD PRESSURE SUPPORT)
PREFILLED_SYRINGE | INTRAVENOUS | Status: DC | PRN
  Administered 2022-07-24 (×3): 160 ug via INTRAVENOUS

## 2022-07-24 MED ORDER — ALUM & MAG HYDROXIDE-SIMETH 200-200-20 MG/5ML PO SUSP: 30.0000 mL | Freq: Four times a day (QID) | ORAL | Status: DC | PRN

## 2022-07-24 MED ORDER — LOSARTAN POTASSIUM 50 MG PO TABS
100.0000 mg | ORAL_TABLET | Freq: Every day | ORAL | Status: DC
  Administered 2022-07-25: 100 mg via ORAL
  Filled 2022-07-24: qty 2

## 2022-07-24 MED ORDER — HYDROCODONE-ACETAMINOPHEN 5-325 MG PO TABS
2.0000 | ORAL_TABLET | ORAL | Status: DC | PRN
  Administered 2022-07-24 – 2022-07-25 (×5): 2 via ORAL
  Filled 2022-07-24 (×5): qty 2

## 2022-07-24 MED ORDER — THROMBIN 20000 UNITS EX KIT: PACK | CUTANEOUS | Status: DC | PRN

## 2022-07-24 MED ORDER — ONDANSETRON HCL 4 MG/2ML IJ SOLN
INTRAMUSCULAR | Status: AC
  Administered 2022-07-24: 4 mg via INTRAVENOUS
  Filled 2022-07-24: qty 2

## 2022-07-24 MED ORDER — MIDAZOLAM HCL 2 MG/2ML IJ SOLN
INTRAMUSCULAR | Status: DC | PRN
  Administered 2022-07-24: 2 mg via INTRAVENOUS

## 2022-07-24 MED ORDER — THROMBIN 5000 UNITS EX SOLR
CUTANEOUS | Status: AC
  Filled 2022-07-24: qty 10000

## 2022-07-24 MED ORDER — ACETAMINOPHEN 500 MG PO TABS
ORAL_TABLET | ORAL | Status: AC
  Administered 2022-07-24: 1000 mg via ORAL
  Filled 2022-07-24: qty 2

## 2022-07-24 MED ORDER — FENTANYL CITRATE (PF) 250 MCG/5ML IJ SOLN
INTRAMUSCULAR | Status: AC
  Filled 2022-07-24: qty 5

## 2022-07-24 MED ORDER — ORAL CARE MOUTH RINSE: 15.0000 mL | Freq: Once | OROMUCOSAL | Status: AC

## 2022-07-24 MED ORDER — ONDANSETRON HCL 4 MG/2ML IJ SOLN: 4.0000 mg | Freq: Four times a day (QID) | INTRAMUSCULAR | Status: DC | PRN

## 2022-07-24 MED ORDER — CEFAZOLIN SODIUM-DEXTROSE 2-4 GM/100ML-% IV SOLN
2.0000 g | Freq: Three times a day (TID) | INTRAVENOUS | Status: AC
  Administered 2022-07-24 – 2022-07-25 (×2): 2 g via INTRAVENOUS
  Filled 2022-07-24 (×2): qty 100

## 2022-07-24 MED ORDER — PANTOPRAZOLE SODIUM 40 MG PO TBEC: 40.0000 mg | DELAYED_RELEASE_TABLET | Freq: Every day | ORAL | Status: DC

## 2022-07-24 MED ORDER — INSULIN ASPART 100 UNIT/ML IJ SOLN: 0.0000 [IU] | Freq: Three times a day (TID) | INTRAMUSCULAR | Status: DC

## 2022-07-24 MED ORDER — INSULIN ISOPHANE & REGULAR (HUMAN 70-30)100 UNIT/ML KWIKPEN: 25.0000 [IU] | PEN_INJECTOR | SUBCUTANEOUS | Status: DC

## 2022-07-24 MED ORDER — MIDAZOLAM HCL 2 MG/2ML IJ SOLN
INTRAMUSCULAR | Status: AC
  Filled 2022-07-24: qty 2

## 2022-07-24 SURGICAL SUPPLY — 54 items

## 2022-07-24 NOTE — Anesthesia Preprocedure Evaluation (Addendum)
Anesthesia Evaluation  Patient identified by MRN, date of birth, ID band Patient awake    Reviewed: Allergy & Precautions, H&P , NPO status , Patient's Chart, lab work & pertinent test results  History of Anesthesia Complications Negative for: history of anesthetic complications  Airway Mallampati: II  TM Distance: >3 FB Neck ROM: Full    Dental no notable dental hx. (+) Dental Advisory Given   Pulmonary neg pulmonary ROS   Pulmonary exam normal        Cardiovascular hypertension, Pt. on medications negative cardio ROS Normal cardiovascular exam     Neuro/Psych negative neurological ROS  negative psych ROS   GI/Hepatic Neg liver ROS,GERD  Medicated,,  Endo/Other  diabetes    Renal/GU negative Renal ROS  negative genitourinary   Musculoskeletal negative musculoskeletal ROS (+)    Abdominal   Peds negative pediatric ROS (+)  Hematology negative hematology ROS (+)   Anesthesia Other Findings   Reproductive/Obstetrics negative OB ROS                             Anesthesia Physical Anesthesia Plan  ASA: 2  Anesthesia Plan: General   Post-op Pain Management: Tylenol PO (pre-op)* and Toradol IV (intra-op)*   Induction: Intravenous  PONV Risk Score and Plan: 4 or greater and Ondansetron, Dexamethasone, Scopolamine patch - Pre-op and Midazolam  Airway Management Planned: Oral ETT  Additional Equipment:   Intra-op Plan:   Post-operative Plan: Extubation in OR  Informed Consent: I have reviewed the patients History and Physical, chart, labs and discussed the procedure including the risks, benefits and alternatives for the proposed anesthesia with the patient or authorized representative who has indicated his/her understanding and acceptance.     Dental advisory given  Plan Discussed with: CRNA and Anesthesiologist  Anesthesia Plan Comments:        Anesthesia Quick  Evaluation

## 2022-07-24 NOTE — Op Note (Signed)
Preoperative diagnosis: Back pain left leg pain consistent with L4-L5 distribution from severe spinal stenosis L3-4 L4-5 left.  Postoperative diagnosis: Same.  Procedure: Decompressive lumbar laminectomies from the left at L3-4 L4-5 with foraminotomies of the L3, L4, and L5 nerve roots with microscopic foraminotomies and microscopic decompression with partial medial facetectomies.  Surgeon: Kary Kos.  Assistant: Nash Shearer.  Anesthesia: General.  EBL: Minimal.  HPI: 52 year old female with back and left hip and leg pain rating down L3-L4-L5 nerve root pattern in the left leg workup revealed severe spinal stenosis L3-4 L4-5 with foraminal stenosis the L3-L4 and L5 nerve roots.  Due to patient's progression of clinical syndrome imaging findings of a conservative treatment I recommended decompressive laminotomies from the left at L3-4 and L4-5.  I extensively reviewed the risks and benefits of the operation with her as well as perioperative course expectations of outcome and alternatives of surgery and she understands and agrees to proceed forward.  Operative procedure: Patient was brought into the OR was induced under general anesthesia positioned prone the Wilson frame her back was prepped and draped in routine sterile fashion utilizing anatomical landmarks an incision was drawn out and infiltrated with 10 cc lidocaine with epi.  Bovie that cautery was used to take down the subcutaneous tissue and subperiosteal dissection was carried lamina of L3, L4 and L5 on the left.  Interoperative x-ray confirmed identification appropriate level so utilizing a high-speed drill the superior aspect of lamina of L5 all the lamina of L4 and inferior aspect of L3 was drilled down laminotomy was begun with a 2 and 3 Miller Kerrison punch.  Ligament flavum was removed in piecemeal fashion exposing the thecal sac.  There was severe hourglass compression of thecal sac at both the disc base levels L3-4 and L4-5 so the  operating microscope was draped and brought into the field and under microscopic lamination under biting of the medial facet decompress the gutter and the lateral left side of the spinal canal I then performed foraminotomies of the L3-L4 and L5 nerve roots under microscopic illumination.  Then explore the disc base pillar space was felt to be spondylitic but not significantly compressive so I left the disc bases alone explore the thecal sac both on the patient's right side as well as the foramina and left-sided canal at L3-4 and L4-5 to confirm patency and no further stenosis.  The wound was then copiously irrigated meticulous hemostasis was maintained Gelfoam was awaiting top of the dura the muscle fascia approximate layers with opted Vicryl skin was closed running 4 subcuticular Dermabond benzoin Steri-Strips and a sterile dressing was applied patient recovery in stable condition.  At the end the case all needle counts and sponge counts were correct.

## 2022-07-24 NOTE — H&P (Signed)
Jill Holt is an 52 y.o. female.   Chief Complaint: Back and left leg pain HPI: 52 year old female with back and left leg pain and some proximal pain limited weakness workup is revealed severe spinal stenosis at L3-4 and L4-5.  Symptomatology was entirely on the left so I recommended decompressive laminectomy on the left at L3-4 and L4-5 due to her failure of conservative treatment imaging findings and progression of clinical syndrome.  Also explained risks and benefits perioperative course expectations of outcome and alternatives and she understands and agrees to proceed forward.  Past Medical History:  Diagnosis Date   Anemia    Arthritis    COVID 09/2018   had pneumonia   Diabetes mellitus without complication (HCC)    GERD (gastroesophageal reflux disease)    Hypertension    Pneumonia     Past Surgical History:  Procedure Laterality Date   CARPAL TUNNEL RELEASE     FEMUR FRACTURE SURGERY     ganglion cyst removal     TUBAL LIGATION      History reviewed. No pertinent family history. Social History:  reports that she has never smoked. She has never been exposed to tobacco smoke. She has never used smokeless tobacco. She reports current alcohol use. She reports that she does not use drugs.  Allergies:  Allergies  Allergen Reactions   Lisinopril Cough    Medications Prior to Admission  Medication Sig Dispense Refill   amLODipine (NORVASC) 10 MG tablet Take 10 mg by mouth in the morning.     cyanocobalamin (VITAMIN B12) 1000 MCG tablet Take 1,000 mcg by mouth in the morning.     glipiZIDE (GLUCOTROL) 10 MG tablet Take 10 mg by mouth in the morning and at bedtime.     insulin isophane & regular human KwikPen (HUMULIN 70/30 KWIKPEN) (70-30) 100 UNIT/ML KwikPen Inject 25-35 Units into the skin See admin instructions. Inject 35 units subcutaneously in the morning & inject 25 units subcutaneously at night     ketorolac (TORADOL) 10 MG tablet Take 10 mg by mouth every 6 (six)  hours as needed (pain.).     losartan-hydrochlorothiazide (HYZAAR) 100-12.5 MG tablet Take 1 tablet by mouth in the morning.     medroxyPROGESTERone (PROVERA) 10 MG tablet Take 10 mg by mouth in the morning.     omeprazole (PRILOSEC) 40 MG capsule Take 40 mg by mouth daily before breakfast.      Results for orders placed or performed during the hospital encounter of 07/24/22 (from the past 48 hour(s))  Glucose, capillary     Status: Abnormal   Collection Time: 07/24/22  8:22 AM  Result Value Ref Range   Glucose-Capillary 311 (H) 70 - 99 mg/dL    Comment: Glucose reference range applies only to samples taken after fasting for at least 8 hours.  Pregnancy, urine POC     Status: None   Collection Time: 07/24/22  9:03 AM  Result Value Ref Range   Preg Test, Ur NEGATIVE NEGATIVE    Comment:        THE SENSITIVITY OF THIS METHODOLOGY IS >24 mIU/mL   CBC per protocol     Status: Abnormal   Collection Time: 07/24/22  9:06 AM  Result Value Ref Range   WBC 4.8 4.0 - 10.5 K/uL   RBC 5.13 (H) 3.87 - 5.11 MIL/uL   Hemoglobin 10.1 (L) 12.0 - 15.0 g/dL   HCT 33.1 (L) 36.0 - 46.0 %   MCV 64.5 (L) 80.0 - 100.0  fL   MCH 19.7 (L) 26.0 - 34.0 pg   MCHC 30.5 30.0 - 36.0 g/dL   RDW 18.7 (H) 11.5 - 15.5 %   Platelets 248 150 - 400 K/uL    Comment: REPEATED TO VERIFY   nRBC 0.0 0.0 - 0.2 %    Comment: Performed at [facility], [place] [address] [place], [place] [id number]  Basic metabolic panel per protocol     Status: Abnormal   Collection Time: 07/24/22  9:06 AM  Result Value Ref Range   Sodium 133 (L) 135 - 145 mmol/L   Potassium 3.7 3.5 - 5.1 mmol/L   Chloride 94 (L) 98 - 111 mmol/L   CO2 26 22 - 32 mmol/L   Glucose, Bld 331 (H) 70 - 99 mg/dL    Comment: Glucose reference range applies only to samples taken after fasting for at least 8 hours.   BUN 14 6 - 20 mg/dL   Creatinine, Ser 0.87 0.44 - 1.00 mg/dL   Calcium 9.3 8.9 - 10.3 mg/dL   GFR, Estimated >60 >60 mL/min    Comment:  (NOTE) Calculated using the CKD-EPI Creatinine Equation (2021)    Anion gap 13 5 - 15    Comment: Performed at [facility] [address], [place], [place] [address]   No results found.  Review of Systems  Musculoskeletal:  Positive for back pain.  Neurological:  Positive for numbness.    Blood pressure (!) 150/101, pulse (!) 102, temperature 98.9 F (37.2 C), temperature source Oral, resp. rate 16, height 5\' 4"  (1.626 m), weight 91.6 kg, last menstrual period 06/10/2022, SpO2 100 %. Physical Exam HENT:     Head: Normocephalic.     Right Ear: Tympanic membrane normal.     Nose: Nose normal.     Mouth/Throat:     Mouth: Mucous membranes are moist.  Cardiovascular:     Rate and Rhythm: Normal rate.  Pulmonary:     Effort: Pulmonary effort is normal.  Abdominal:     General: Abdomen is flat.  Musculoskeletal:        General: Normal range of motion.  Skin:    General: Skin is warm.  Neurological:     Mental Status: She is alert.     Comments: Patient is awake and alert she is some pain limited weakness left lower extremity 4 to 4+ out of 5 hip flexor and quad strength.  Otherwise distally 5 out of 5 bilaterally and right lower extremity strength is 5 out of 5      Assessment/Plan 52 year old presents for left-sided decompressive laminectomy L3-4 L4-5  Elaina Hoops, MD 07/24/2022, 9:50 AM

## 2022-07-24 NOTE — Anesthesia Postprocedure Evaluation (Signed)
Anesthesia Post Note  Patient: Jill Holt  Procedure(s) Performed: Sublaminar decompression - left - Lumbar Three-Lumbar Four - Lumbar Four-Lumbar Five (Left: Back)     Patient location during evaluation: PACU Anesthesia Type: General Level of consciousness: sedated Pain management: pain level controlled Vital Signs Assessment: post-procedure vital signs reviewed and stable Respiratory status: spontaneous breathing and respiratory function stable Cardiovascular status: stable Postop Assessment: no apparent nausea or vomiting Anesthetic complications: no  No notable events documented.  Last Vitals:  Vitals:   07/24/22 1315 07/24/22 1337  BP: (!) 142/99 (!) 159/99  Pulse: 90 88  Resp: 12 18  Temp: 36.8 C 36.8 C  SpO2: 98% 99%    Last Pain:  Vitals:   07/24/22 1337  TempSrc: Oral  PainSc:                  Shaleen Talamantez DANIEL

## 2022-07-24 NOTE — Anesthesia Procedure Notes (Signed)
Procedure Name: Intubation Date/Time: 07/24/2022 10:26 AM  Performed by: Darletta Moll, CRNAPre-anesthesia Checklist: Patient identified, Emergency Drugs available, Suction available and Patient being monitored Patient Re-evaluated:Patient Re-evaluated prior to induction Oxygen Delivery Method: Circle system utilized Preoxygenation: Pre-oxygenation with 100% oxygen Induction Type: IV induction Ventilation: Mask ventilation without difficulty Laryngoscope Size: Mac and 3 Grade View: Grade I Tube type: Oral Tube size: 7.0 mm Number of attempts: 1 Airway Equipment and Method: Stylet and Oral airway Placement Confirmation: ETT inserted through vocal cords under direct vision, positive ETCO2 and breath sounds checked- equal and bilateral Secured at: 21 cm Tube secured with: Tape Dental Injury: Teeth and Oropharynx as per pre-operative assessment

## 2022-07-24 NOTE — Inpatient Diabetes Management (Signed)
Inpatient Diabetes Program Recommendations  AACE/ADA: New Consensus Statement on Inpatient Glycemic Control (2015)  Target Ranges:  Prepandial:   less than 140 mg/dL      Peak postprandial:   less than 180 mg/dL (1-2 hours)      Critically ill patients:  140 - 180 mg/dL   Lab Results  Component Value Date   GLUCAP 196 (H) 07/24/2022    Review of Glycemic Control  Diabetes history: DM2 Outpatient Diabetes medications: 70/30 insulin 35 units am, 25 units pm, Glucotrol 10 units bid Current orders for Inpatient glycemic control: 70/30 25-35 units ac bid  Inpatient Diabetes Program Recommendations:   Received Decadron 10 mg @ 1029 am. Please consider: -D/C glucotrol -Change 70/30 bid to 35 units ac breakfast, 25 units ac dinner  Thank you, Bethena Roys E. Stark Aguinaga, RN, MSN, CDE  Diabetes Coordinator Inpatient Glycemic Control Team Team Pager [phone] (8am-5pm) 07/24/2022 1:44 PM

## 2022-07-24 NOTE — Transfer of Care (Signed)
Immediate Anesthesia Transfer of Care Note  Patient: Jill Holt  Procedure(s) Performed: Sublaminar decompression - left - Lumbar Three-Lumbar Four - Lumbar Four-Lumbar Five (Left: Back)  Patient Location: PACU  Anesthesia Type:General  Level of Consciousness: drowsy and patient cooperative  Airway & Oxygen Therapy: Patient Spontanous Breathing  Post-op Assessment: Report given to RN, Post -op Vital signs reviewed and stable, and Patient moving all extremities X 4  Post vital signs: Reviewed and stable  Last Vitals:  Vitals Value Taken Time  BP 143/92 07/24/22 1209  Temp    Pulse 93 07/24/22 1213  Resp 15 07/24/22 1213  SpO2 99 % 07/24/22 1213  Vitals shown include unvalidated device data.  Last Pain:  Vitals:   07/24/22 0834  TempSrc:   PainSc: 3       Patients Stated Pain Goal: 0 (0000000 [id number])  Complications: No notable events documented.

## 2022-07-25 ENCOUNTER — Encounter (HOSPITAL_COMMUNITY): Payer: Self-pay | Admitting: Neurosurgery

## 2022-07-25 DIAGNOSIS — M48061 Spinal stenosis, lumbar region without neurogenic claudication: Secondary | ICD-10-CM | POA: Diagnosis not present

## 2022-07-25 LAB — GLUCOSE, CAPILLARY: Glucose-Capillary: 126 mg/dL — ABNORMAL HIGH (ref 70–99)

## 2022-07-25 MED ORDER — HYDROCODONE-ACETAMINOPHEN 5-325 MG PO TABS: 1.0000 | ORAL_TABLET | ORAL | 0 refills | Status: DC | PRN

## 2022-07-25 MED ORDER — CYCLOBENZAPRINE HCL 10 MG PO TABS: 10.0000 mg | ORAL_TABLET | Freq: Three times a day (TID) | ORAL | 0 refills | Status: DC | PRN

## 2022-07-25 MED FILL — Thrombin For Soln 5000 Unit: CUTANEOUS | Qty: 4 | Status: AC

## 2022-07-25 NOTE — Plan of Care (Signed)
  Problem: Education: Goal: Ability to verbalize activity precautions or restrictions will improve Outcome: Completed/Met Goal: Knowledge of the prescribed therapeutic regimen will improve Outcome: Completed/Met Goal: Understanding of discharge needs will improve Outcome: Completed/Met   Problem: Activity: Goal: Ability to avoid complications of mobility impairment will improve Outcome: Completed/Met Goal: Ability to tolerate increased activity will improve Outcome: Completed/Met Goal: Will remain free from falls Outcome: Completed/Met   Problem: Bowel/Gastric: Goal: Gastrointestinal status for postoperative course will improve Outcome: Completed/Met   Problem: Clinical Measurements: Goal: Ability to maintain clinical measurements within normal limits will improve Outcome: Completed/Met Goal: Postoperative complications will be avoided or minimized Outcome: Completed/Met Goal: Diagnostic test results will improve Outcome: Completed/Met   Problem: Pain Management: Goal: Pain level will decrease Outcome: Completed/Met   Problem: Skin Integrity: Goal: Will show signs of wound healing Outcome: Completed/Met   Problem: Health Behavior/Discharge Planning: Goal: Identification of resources available to assist in meeting health care needs will improve Outcome: Completed/Met   Problem: Bladder/Genitourinary: Goal: Urinary functional status for postoperative course will improve Outcome: Completed/Met Patient alert and oriented, void, ambulate. D/c instructions explain and given. Surgical site clean and dry. Pt. D/c home per order.

## 2022-07-25 NOTE — Discharge Summary (Signed)
Physician Discharge Summary  Patient ID: Genavive Melikian MRN: [id number] DOB/AGE: 50/07/1970 50 y.o.  Admit date: 07/24/2022 Discharge date: 07/25/2022  Admission Diagnoses:  Back pain left leg pain consistent with L4-L5 distribution from severe spinal stenosis L3-4 L4-5 left.    Discharge Diagnoses: same   Discharged Condition: good  Hospital Course: The patient was admitted on 07/24/2022 and taken to the operating room where the patient underwent decompression L3-4, L4-5. The patient tolerated the procedure well and was taken to the recovery room and then to the floor in stable condition. The hospital course was routine. There were no complications. The wound remained clean dry and intact. Pt had appropriate back soreness. No complaints of leg pain or new N/T/W. The patient remained afebrile with stable vital signs, and tolerated a regular diet. The patient continued to increase activities, and pain was well controlled with oral pain medications.   Consults: None  Significant Diagnostic Studies:  Results for orders placed or performed during the hospital encounter of 07/24/22  Surgical pcr screen   Specimen: Nasal Mucosa; Nasal Swab  Result Value Ref Range   MRSA, PCR NEGATIVE NEGATIVE   Staphylococcus aureus NEGATIVE NEGATIVE  Glucose, capillary  Result Value Ref Range   Glucose-Capillary 311 (H) 70 - 99 mg/dL  CBC per protocol  Result Value Ref Range   WBC 4.8 4.0 - 10.5 K/uL   RBC 5.13 (H) 3.87 - 5.11 MIL/uL   Hemoglobin 10.1 (L) 12.0 - 15.0 g/dL   HCT 33.1 (L) 36.0 - 46.0 %   MCV 64.5 (L) 80.0 - 100.0 fL   MCH 19.7 (L) 26.0 - 34.0 pg   MCHC 30.5 30.0 - 36.0 g/dL   RDW 18.7 (H) 11.5 - 15.5 %   Platelets 248 150 - 400 K/uL   nRBC 0.0 0.0 - 0.2 %  Basic metabolic panel per protocol  Result Value Ref Range   Sodium 133 (L) 135 - 145 mmol/L   Potassium 3.7 3.5 - 5.1 mmol/L   Chloride 94 (L) 98 - 111 mmol/L   CO2 26 22 - 32 mmol/L   Glucose, Bld 331 (H) 70 - 99 mg/dL   BUN  14 6 - 20 mg/dL   Creatinine, Ser 0.87 0.44 - 1.00 mg/dL   Calcium 9.3 8.9 - 10.3 mg/dL   GFR, Estimated >60 >60 mL/min   Anion gap 13 5 - 15  Glucose, capillary  Result Value Ref Range   Glucose-Capillary 237 (H) 70 - 99 mg/dL  Glucose, capillary  Result Value Ref Range   Glucose-Capillary 196 (H) 70 - 99 mg/dL  Hemoglobin A1c  Result Value Ref Range   Hgb A1c MFr Bld 11.0 (H) 4.8 - 5.6 %   Mean Plasma Glucose 269 mg/dL  Glucose, capillary  Result Value Ref Range   Glucose-Capillary 313 (H) 70 - 99 mg/dL   Comment 1 Notify RN    Comment 2 Document in Chart   Glucose, capillary  Result Value Ref Range   Glucose-Capillary 280 (H) 70 - 99 mg/dL   Comment 1 Notify RN    Comment 2 Document in Chart   Glucose, capillary  Result Value Ref Range   Glucose-Capillary 126 (H) 70 - 99 mg/dL   Comment 1 Notify RN    Comment 2 Document in Chart   Pregnancy, urine POC  Result Value Ref Range   Preg Test, Ur NEGATIVE NEGATIVE    DG Lumbar Spine 1 View  Result Date: 07/24/2022 CLINICAL DATA:  [id number] Surgery, elective  [id number] EXAM: LUMBAR SPINE - 1 VIEW COMPARISON:  Radiograph 12/03/2015 FINDINGS: Intraoperative lateral radiograph of the lumbar spine for localization. Tissue retractors and instrument probe are in place at the level of L4. IMPRESSION: Intraoperative localization at L4. Electronically Signed   By: Maurine Simmering M.D.   On: 07/24/2022 14:05    Antibiotics:  Anti-infectives (From admission, onward)    Start     Dose/Rate Route Frequency Ordered Stop   07/24/22 1800  ceFAZolin (ANCEF) IVPB 2g/100 mL premix        2 g 200 mL/hr over 30 Minutes Intravenous Every 8 hours 07/24/22 1331 07/25/22 0207       Discharge Exam: Blood pressure (!) 144/87, pulse 93, temperature 98.9 F (37.2 C), temperature source Oral, resp. rate 18, height 5\' 4"  (1.626 m), weight 91.6 kg, last menstrual period 06/10/2022, SpO2 96 %. Neurologic: Grossly normal Ambulating and voiding well incision cdi    Discharge Medications:   Allergies as of 07/25/2022       Reactions   Lisinopril Cough        Medication List     TAKE these medications    amLODipine 10 MG tablet Commonly known as: NORVASC Take 10 mg by mouth in the morning.   cyanocobalamin 1000 MCG tablet Commonly known as: VITAMIN B12 Take 1,000 mcg by mouth in the morning.   cyclobenzaprine 10 MG tablet Commonly known as: FLEXERIL Take 1 tablet (10 mg total) by mouth 3 (three) times daily as needed for muscle spasms.   glipiZIDE 10 MG tablet Commonly known as: GLUCOTROL Take 10 mg by mouth in the morning and at bedtime.   HumuLIN 70/30 KwikPen (70-30) 100 UNIT/ML KwikPen Generic drug: insulin isophane & regular human KwikPen Inject 25-35 Units into the skin See admin instructions. Inject 35 units subcutaneously in the morning & inject 25 units subcutaneously at night   HYDROcodone-acetaminophen 5-325 MG tablet Commonly known as: NORCO/VICODIN Take 1 tablet by mouth every 4 (four) hours as needed for severe pain ((score 7 to 10)).   ketorolac 10 MG tablet Commonly known as: TORADOL Take 10 mg by mouth every 6 (six) hours as needed (pain.).   losartan-hydrochlorothiazide 100-12.5 MG tablet Commonly known as: HYZAAR Take 1 tablet by mouth in the morning.   medroxyPROGESTERone 10 MG tablet Commonly known as: PROVERA Take 10 mg by mouth in the morning.   omeprazole 40 MG capsule Commonly known as: PRILOSEC Take 40 mg by mouth daily before breakfast.        Disposition: home   Final Dx: lumbar laminectomy L3-4, L4-5  Discharge Instructions      Remove dressing in 72 hours   Complete by: As directed    Call MD for:  difficulty breathing, headache or visual disturbances   Complete by: As directed    Call MD for:  hives   Complete by: As directed    Call MD for:  persistant dizziness or light-headedness   Complete by: As directed    Call MD for:  persistant nausea and vomiting   Complete by: As  directed    Call MD for:  redness, tenderness, or signs of infection (pain, swelling, redness, odor or green/yellow discharge around incision site)   Complete by: As directed    Call MD for:  severe uncontrolled pain   Complete by: As directed    Call MD for:  temperature >100.4   Complete by: As directed    Diet - low sodium heart healthy  Complete by: As directed    Driving Restrictions   Complete by: As directed    No driving for 2 weeks, no riding in the car for 1 week   Increase activity slowly   Complete by: As directed    Lifting restrictions   Complete by: As directed    No lifting more than 8 lbs          Signed: Ocie Cornfield Keamber Macfadden 07/25/2022, 7:43 AM

## 2022-07-25 NOTE — Evaluation (Signed)
Occupational Therapy Evaluation Patient Details Name: Jill Holt MRN: [id number] DOB: [birth year] Today's Date: 07/25/2022   History of Present Illness 52 year old s/p  left-sided decompressive laminectomy L3-4 L4-5.   Clinical Impression   Patient evaluated by Occupational Therapy with no further acute OT needs identified. All education has been completed and the patient has no further questions. Prior to surgery, pt reports living alone in a condo independent with all ADL tasks and functional mobility. Daughter present during evaluation and was provided education along with pt regarding back precautions, DME recommendations, and compensatory technique for daily tasks. Recommended PT referral due to pt's LLE weakness and difficulty with functional mobility. See below for any follow-up Occupational Therapy or equipment needs. OT is signing off. Thank you for this referral.       Recommendations for follow up therapy are one component of a multi-disciplinary discharge planning process, led by the attending physician.  Recommendations may be updated based on patient status, additional functional criteria and insurance authorization.   Assistance Recommended at Discharge Intermittent Supervision/Assistance  Patient can return home with the following A little help with walking and/or transfers;A little help with bathing/dressing/bathroom;Assistance with cooking/housework;Assist for transportation;Help with stairs or ramp for entrance    Functional Status Assessment  Patient has had a recent decline in their functional status and demonstrates the ability to make significant improvements in function in a reasonable and predictable amount of time.  Equipment Recommendations  BSC/3in1    Recommendations for Other Services PT consult     Precautions / Restrictions Precautions Precautions: Back Precaution Booklet Issued: Yes (comment) Precaution Comments: provided handout along with verbal  instructions. No brace needed. Restrictions Weight Bearing Restrictions: No      Mobility Bed Mobility Overal bed mobility: Needs Assistance Bed Mobility: Rolling, Sidelying to Sit, Sit to Sidelying Rolling: Min guard Sidelying to sit: HOB elevated, Min guard (HOB elevated 7*)     Sit to sidelying: Min assist General bed mobility comments: Assisted provided to bring BLE up onto bed. (Pt plans to sleep in recliner at home.) VC provided for log roll technique with use of bed rail to assist. Patient Response: Cooperative  Transfers Overall transfer level: Needs assistance Equipment used: Straight cane Transfers: Sit to/from Stand Sit to Stand: Min assist, Supervision, From elevated surface           General transfer comment: Initial sit to stand transition required Min A to power up and increased time needed to achieve a full upright standing position. VC provided for hand placement and appropriate muscle activation.  At end of session, pt completed sit<>stand once more and was able to complete with SBA with increased time.      Balance Overall balance assessment: Mild deficits observed, not formally tested       ADL either performed or assessed with clinical judgement   ADL Overall ADL's : Needs assistance/impaired         Upper Body Bathing: Minimal assistance;Sitting   Lower Body Bathing: Maximal assistance;Sit to/from stand;Adhering to back precautions   Upper Body Dressing : Minimal assistance;Sitting   Lower Body Dressing: Total assistance;Sit to/from stand   Toilet Transfer: Minimal Teacher, English as a foreign language [facility])         Vision Baseline Vision/History: 1 Wears glasses (at night for driving) Ability to See in Adequate Light: 0 Adequate Patient Visual Report: No change from baseline Vision Assessment?: No apparent visual deficits            Pertinent Vitals/Pain Pain Assessment Pain Assessment:  0-10 Pain Score: 4  Pain Location: low back Pain  Descriptors / Indicators: Aching, Sore, Operative site guarding Pain Intervention(s): Limited activity within patient's tolerance, Monitored during session, Repositioned     Hand Dominance Right   Extremity/Trunk Assessment Upper Extremity Assessment Upper Extremity Assessment: Overall WFL for tasks assessed   Lower Extremity Assessment Lower Extremity Assessment: LLE deficits/detail LLE Deficits / Details: Grossly assessed seated on EOB. Hip flexion and knee extension limited due to weakness.   Cervical / Trunk Assessment Cervical / Trunk Assessment: Back Surgery   Communication Communication Communication: No difficulties   Cognition Arousal/Alertness: Awake/alert Behavior During Therapy: WFL for tasks assessed/performed Overall Cognitive Status: Within Functional Limits for tasks assessed           General Comments  Attempted 1 step with use of hand rail and SPC. Pt unable to step up using RLE due to pain and stairs assessment was deferred.            Home Living Family/patient expects to be discharged to:: Private residence Living Arrangements: Alone Available Help at Discharge: Family;Available PRN/intermittently (Daughter) Type of Home: Other(Comment) (Condo)       Home Layout: Two level;1/2 bath on main level;Other (Comment) (Able to have recliner brought down to main level to sleep for now.) Alternate Level Stairs-Number of Steps: flight   Bathroom Shower/Tub: Teacher, early years/pre: Standard (Handicap height toilet upstairs)     Home Equipment: None   Additional Comments: Bed is very high. Pt reports that she has to jump up to get in. Able to have someone bring the recliner down to main level for her to sleep in.      Prior Functioning/Environment Prior Level of Function : Independent/Modified Independent;Driving       Mobility Comments: Reports recent LLE weakness with difficulty to step with extremity when walking. Started shortly  before surgery.          OT Problem List: Decreased strength      OT Treatment/Interventions:   Self-care management   OT Goals(Current goals can be found in the care plan section) Acute Rehab OT Goals Patient Stated Goal: to go home  OT Frequency:  1X visit       AM-PAC OT "6 Clicks" Daily Activity     Outcome Measure Help from another person eating meals?: None Help from another person taking care of personal grooming?: None Help from another person toileting, which includes using toliet, bedpan, or urinal?: A Lot Help from another person bathing (including washing, rinsing, drying)?: A Lot Help from another person to put on and taking off regular upper body clothing?: A Little Help from another person to put on and taking off regular lower body clothing?: Total 6 Click Score: 16   End of Session Equipment Utilized During Treatment: Gait belt;Other (comment) [facility]) Nurse Communication: Mobility status;Patient requests pain meds;Other (comment) (DME needs)  Activity Tolerance: Patient tolerated treatment well;Patient limited by pain Patient left: in bed;with call bell/phone within reach;with family/visitor present  OT Visit Diagnosis: Unsteadiness on feet (R26.81);Muscle weakness (generalized) (M62.81);Pain Pain - Right/Left:  (mid lower) Pain - part of body:  (back)                Time: [id number] OT Time Calculation (min): 25 min Charges:  OT General Charges $OT Visit: 1 Visit OT Evaluation $OT Eval Moderate Complexity: 1 Mod OT Treatments $Self Care/Home Management : 8-22 mins  Ailene Ravel, OTR/L,CBIS  Supplemental OT - [facility] and [facility] Secure  Chat Preferred    Mustaf Antonacci, Clarene Duke 07/25/2022, 10:20 AM

## 2022-07-25 NOTE — Evaluation (Signed)
Physical Therapy Evaluation  Patient Details Name: Jill Holt MRN: [id number] DOB: [birth year] Today's Date: 07/25/2022  History of Present Illness  Pt is a 52 year old F who presents s/p  left-sided decompressive laminectomy L3-4 L4-5 on 07/24/2022. PMH significant for DM, HTN, carpal tunnel release, femur fracture surgery.   Clinical Impression  Pt admitted with above diagnosis. At the time of PT eval, pt was able to demonstrate transfers and ambulation with gross min guard assist and RW for support. Pt was unable to attempt stair training at this time due to pain. Pt was educated on precautions, positioning recommendations, appropriate activity progression, and car transfer. Pt currently with functional limitations due to the deficits listed below (see PT Problem List). Pt will benefit from skilled PT to increase their independence and safety with mobility to allow discharge to the venue listed below.         Recommendations for follow up therapy are one component of a multi-disciplinary discharge planning process, led by the attending physician.  Recommendations may be updated based on patient status, additional functional criteria and insurance authorization.  Follow Up Recommendations       Assistance Recommended at Discharge Frequent or constant Supervision/Assistance  Patient can return home with the following  A little help with walking and/or transfers;A little help with bathing/dressing/bathroom;Assistance with cooking/housework;Assist for transportation;Help with stairs or ramp for entrance    Equipment Recommendations Rolling walker (2 wheels);BSC/3in1  Recommendations for Other Services       Functional Status Assessment Patient has had a recent decline in their functional status and demonstrates the ability to make significant improvements in function in a reasonable and predictable amount of time.     Precautions / Restrictions Precautions Precautions:  Back Precaution Booklet Issued: Yes (comment) Precaution Comments: provided handout along with verbal instructions. No brace needed. Restrictions Weight Bearing Restrictions: No      Mobility  Bed Mobility Overal bed mobility: Needs Assistance Bed Mobility: Rolling, Sidelying to Sit, Sit to Sidelying Rolling: Min guard Sidelying to sit: HOB elevated, Min guard     Sit to sidelying: Min assist General bed mobility comments: Assisted provided to bring BLE up onto bed. (Pt plans to sleep in recliner at home.) VC provided for log roll technique with use of bed rail to assist.    Transfers Overall transfer level: Needs assistance Equipment used: Rolling walker (2 wheels) Transfers: Sit to/from Stand Sit to Stand: From elevated surface, Min guard           General transfer comment: Close guard to power up to full stand. No assist required but increased time taken to achieve full hip/knee extension.    Ambulation/Gait Ambulation/Gait assistance: Min guard Gait Distance (Feet): 250 Feet Assistive device: Rolling walker (2 wheels) Gait Pattern/deviations: Step-through pattern, Decreased stride length, Trunk flexed Gait velocity: Decreased Gait velocity interpretation: <1.31 ft/sec, indicative of household ambulator   General Gait Details: VC's for sequencing and general safety. No assist required but close guard provided for safety. Pt fatigued quickly requiring standing rest breaks.  Stairs Stairs: Yes       General stair comments: Attempted but pt unable to power up to next step when leading with the R. Pt unable to lift L foot up to place on step at all. Pt states family can bring her recliner downstairs for her to sleep in for now.  Wheelchair Mobility    Modified Rankin (Stroke Patients Only)       Balance Overall balance assessment: Mild deficits observed,  not formally tested                                           Pertinent Vitals/Pain  Pain Assessment Pain Assessment: Faces Faces Pain Scale: Hurts even more Pain Location: low back Pain Descriptors / Indicators: Aching, Sore, Operative site guarding Pain Intervention(s): Limited activity within patient's tolerance, Monitored during session, Repositioned    Home Living Family/patient expects to be discharged to:: Private residence Living Arrangements: Alone Available Help at Discharge: Family;Available PRN/intermittently (Daughter) Type of Home: Other(Comment) (Condo)       Alternate Level Stairs-Number of Steps: flight Home Layout: Two level;1/2 bath on main level;Other (Comment) (Able to have recliner brought down to main level to sleep for now.) Home Equipment: None Additional Comments: Bed is very high. Pt reports that she has to jump up to get in. Able to have someone bring the recliner down to main level for her to sleep in.    Prior Function Prior Level of Function : Independent/Modified Independent;Driving             Mobility Comments: Reports recent LLE weakness with difficulty to step with extremity when walking. Started shortly before surgery.       Hand Dominance   Dominant Hand: Right    Extremity/Trunk Assessment   Upper Extremity Assessment Upper Extremity Assessment: Defer to OT evaluation    Lower Extremity Assessment Lower Extremity Assessment: LLE deficits/detail LLE Deficits / Details: Decreased strength and muscular endurance consistent with pre-op diagnosis    Cervical / Trunk Assessment Cervical / Trunk Assessment: Back Surgery  Communication   Communication: No difficulties  Cognition Arousal/Alertness: Awake/alert Behavior During Therapy: WFL for tasks assessed/performed Overall Cognitive Status: Within Functional Limits for tasks assessed                                          General Comments General comments (skin integrity, edema, etc.): Attempted 1 step with use of hand rail and SPC. Pt unable  to step up using RLE due to pain and stairs assessment was deferred.    Exercises     Assessment/Plan    PT Assessment Patient needs continued PT services  PT Problem List Decreased strength;Decreased activity tolerance;Decreased balance;Decreased mobility;Decreased knowledge of use of DME;Decreased safety awareness;Decreased knowledge of precautions;Pain       PT Treatment Interventions DME instruction;Gait training;Stair training;Functional mobility training;Therapeutic activities;Therapeutic exercise;Balance training;Patient/family education    PT Goals (Current goals can be found in the Care Plan section)  Acute Rehab PT Goals Patient Stated Goal: Home today, decrease pain PT Goal Formulation: With patient Time For Goal Achievement: 08/01/22 Potential to Achieve Goals: Good    Frequency Min 5X/week     Co-evaluation               AM-PAC PT "6 Clicks" Mobility  Outcome Measure Help needed turning from your back to your side while in a flat bed without using bedrails?: A Little Help needed moving from lying on your back to sitting on the side of a flat bed without using bedrails?: A Little Help needed moving to and from a bed to a chair (including a wheelchair)?: A Little Help needed standing up from a chair using your arms (e.g., wheelchair or bedside chair)?: A Little Help  needed to walk in hospital room?: A Little Help needed climbing 3-5 steps with a railing? : Total 6 Click Score: 16    End of Session Equipment Utilized During Treatment: Gait belt Activity Tolerance: Patient limited by pain Patient left: in bed;with call bell/phone within reach;with family/visitor present Nurse Communication: Mobility status PT Visit Diagnosis: Unsteadiness on feet (R26.81);Pain Pain - part of body:  (back)    Time: 1042-1100 PT Time Calculation (min) (ACUTE ONLY): 18 min   Charges:   PT Evaluation $PT Eval Low Complexity: [place], PT,  DPT Acute Rehabilitation Services Secure Chat Preferred Office: [phone]   Thelma Comp 07/25/2022, 1:11 PM

## 2022-07-28 ENCOUNTER — Other Ambulatory Visit: Payer: Self-pay

## 2022-07-28 ENCOUNTER — Emergency Department (HOSPITAL_COMMUNITY)
Admission: EM | Admit: 2022-07-28 | Discharge: 2022-07-28 | Disposition: A | Discharge status: Home / Self Care | Payer: BLUE CROSS/BLUE SHIELD | Attending: Emergency Medicine | Admitting: Emergency Medicine

## 2022-07-28 ENCOUNTER — Emergency Department (HOSPITAL_COMMUNITY): Payer: BLUE CROSS/BLUE SHIELD

## 2022-07-28 ENCOUNTER — Encounter (HOSPITAL_COMMUNITY): Payer: Self-pay | Admitting: Emergency Medicine

## 2022-07-28 DIAGNOSIS — R140 Abdominal distension (gaseous): Secondary | ICD-10-CM | POA: Diagnosis not present

## 2022-07-28 DIAGNOSIS — R7309 Other abnormal glucose: Secondary | ICD-10-CM | POA: Diagnosis not present

## 2022-07-28 DIAGNOSIS — K5900 Constipation, unspecified: Secondary | ICD-10-CM

## 2022-07-28 DIAGNOSIS — R1084 Generalized abdominal pain: Secondary | ICD-10-CM | POA: Diagnosis not present

## 2022-07-28 DIAGNOSIS — R109 Unspecified abdominal pain: Secondary | ICD-10-CM | POA: Diagnosis present

## 2022-07-28 LAB — URINALYSIS, ROUTINE W REFLEX MICROSCOPIC
Bilirubin Urine: NEGATIVE
Glucose, UA: >=500 mg/dL — AB
Hgb urine dipstick: NEGATIVE
Ketones, ur: NEGATIVE mg/dL
Leukocytes,Ua: NEGATIVE
Nitrite: NEGATIVE
Protein, ur: 30 mg/dL — AB
Specific Gravity, Urine: 1.021 (ref 1.005–1.030)
pH: 5 (ref 5.0–8.0)

## 2022-07-28 LAB — I-STAT BETA HCG BLOOD, ED (MC, WL, AP ONLY): I-stat hCG, quantitative: 10.6 m[IU]/mL — ABNORMAL HIGH (ref <5)

## 2022-07-28 LAB — BASIC METABOLIC PANEL
Anion gap: 11 (ref 5–15)
BUN: 23 mg/dL — ABNORMAL HIGH (ref 6–20)
CO2: 26 mmol/L (ref 22–32)
Calcium: 9.3 mg/dL (ref 8.9–10.3)
Chloride: 93 mmol/L — ABNORMAL LOW (ref 98–111)
Creatinine, Ser: 1.03 mg/dL — ABNORMAL HIGH (ref 0.44–1.00)
GFR, Estimated: >60 mL/min (ref >60)
Glucose, Bld: 303 mg/dL — ABNORMAL HIGH (ref 70–99)
Potassium: 4.2 mmol/L (ref 3.5–5.1)
Sodium: 130 mmol/L — ABNORMAL LOW (ref 135–145)

## 2022-07-28 LAB — CBC WITH DIFFERENTIAL/PLATELET
Abs Immature Granulocytes: 0.04 10*3/uL (ref 0.00–0.07)
Basophils Absolute: 0.1 10*3/uL (ref 0.0–0.1)
Basophils Relative: 1 %
Eosinophils Absolute: 0.2 10*3/uL (ref 0.0–0.5)
Eosinophils Relative: 2 %
HCT: 30.9 % — ABNORMAL LOW (ref 36.0–46.0)
Hemoglobin: 9.1 g/dL — ABNORMAL LOW (ref 12.0–15.0)
Immature Granulocytes: 0 %
Lymphocytes Relative: 7 %
Lymphs Abs: 0.7 10*3/uL (ref 0.7–4.0)
MCH: 19.7 pg — ABNORMAL LOW (ref 26.0–34.0)
MCHC: 29.4 g/dL — ABNORMAL LOW (ref 30.0–36.0)
MCV: 66.7 fL — ABNORMAL LOW (ref 80.0–100.0)
Monocytes Absolute: 0.8 10*3/uL (ref 0.1–1.0)
Monocytes Relative: 9 %
Neutro Abs: 7.3 10*3/uL (ref 1.7–7.7)
Neutrophils Relative %: 81 %
Platelets: 290 10*3/uL (ref 150–400)
RBC: 4.63 MIL/uL (ref 3.87–5.11)
RDW: 19.6 % — ABNORMAL HIGH (ref 11.5–15.5)
WBC: 9.1 10*3/uL (ref 4.0–10.5)
nRBC: 0 % (ref 0.0–0.2)

## 2022-07-28 LAB — LIPASE, BLOOD: Lipase: 28 U/L (ref 11–51)

## 2022-07-28 LAB — D-DIMER, QUANTITATIVE: D-Dimer, Quant: 1.46 ug/mL-FEU — ABNORMAL HIGH (ref 0.00–0.50)

## 2022-07-28 LAB — HCG, SERUM, QUALITATIVE: Preg, Serum: NEGATIVE

## 2022-07-28 LAB — CBG MONITORING, ED: Glucose-Capillary: 295 mg/dL — ABNORMAL HIGH (ref 70–99)

## 2022-07-28 MED ORDER — SODIUM CHLORIDE 0.9 % IV BOLUS
500.0000 mL | Freq: Once | INTRAVENOUS | Status: AC
  Administered 2022-07-28: 500 mL via INTRAVENOUS

## 2022-07-28 MED ORDER — MORPHINE SULFATE (PF) 4 MG/ML IV SOLN
4.0000 mg | Freq: Once | INTRAVENOUS | Status: AC
  Administered 2022-07-28: 4 mg via INTRAVENOUS
  Filled 2022-07-28: qty 1

## 2022-07-28 MED ORDER — MAGNESIUM CITRATE PO SOLN: 1.0000 | Freq: Once | ORAL | 0 refills | Status: AC

## 2022-07-28 MED ORDER — POLYETHYLENE GLYCOL 3350 17 G PO PACK: 17.0000 g | PACK | Freq: Every day | ORAL | 0 refills | Status: DC

## 2022-07-28 MED ORDER — IOHEXOL 350 MG/ML SOLN
75.0000 mL | Freq: Once | INTRAVENOUS | Status: AC | PRN
  Administered 2022-07-28: 75 mL via INTRAVENOUS

## 2022-07-28 MED ORDER — IOPAMIDOL (ISOVUE-370) INJECTION 76%
75.0000 mL | Freq: Once | INTRAVENOUS | Status: AC | PRN
  Administered 2022-07-28: 75 mL via INTRAVENOUS

## 2022-07-28 MED ORDER — ONDANSETRON HCL 4 MG/2ML IJ SOLN
4.0000 mg | Freq: Once | INTRAMUSCULAR | Status: AC
  Administered 2022-07-28: 4 mg via INTRAVENOUS
  Filled 2022-07-28: qty 2

## 2022-07-28 MED ORDER — LACTATED RINGERS IV BOLUS
1000.0000 mL | Freq: Once | INTRAVENOUS | Status: AC
  Administered 2022-07-28: 1000 mL via INTRAVENOUS

## 2022-07-28 MED ORDER — KETOROLAC TROMETHAMINE 15 MG/ML IJ SOLN
15.0000 mg | Freq: Once | INTRAMUSCULAR | Status: DC
  Filled 2022-07-28 (×2): qty 1

## 2022-07-28 NOTE — ED Triage Notes (Signed)
Pt had recent back surgery. Taking narcotics and no BM since Wedns. She took some natural Senna tea gummy with no relief. Belly is distended and she is nausea. No hx of add surgeries.

## 2022-07-28 NOTE — ED Provider Notes (Signed)
Care of patient assumed from Dr. Waverly Ferrari.  This patient presents for abdominal pain and distention.  She is postop day 4 from back surgery.  She has not had a bowel movement in 6 days.  She is currently awaiting CT scan. Physical Exam  BP (!) 163/84   Pulse (!) 117   Temp 98.6 F (37 C) (Oral)   Resp 19   Ht 5\' 4"  (1.626 m)   Wt 91.6 kg   LMP 06/10/2022 (Within Weeks) Comment: states she is pre-menopausal  SpO2 92%   BMI 34.67 kg/m   Physical Exam Vitals and nursing note reviewed.  Constitutional:      General: She is not in acute distress.    Appearance: She is well-developed. She is not ill-appearing, toxic-appearing or diaphoretic.  HENT:     Head: Normocephalic and atraumatic.     Mouth/Throat:     Mouth: Mucous membranes are moist.  Eyes:     Conjunctiva/sclera: Conjunctivae normal.  Cardiovascular:     Rate and Rhythm: Normal rate and regular rhythm.  Pulmonary:     Effort: Pulmonary effort is normal. No respiratory distress.  Abdominal:     General: There is distension.     Palpations: Abdomen is soft.     Tenderness: There is abdominal tenderness. There is no guarding or rebound.  Musculoskeletal:        General: No swelling.     Cervical back: Neck supple.  Skin:    General: Skin is warm and dry.     Capillary Refill: Capillary refill takes less than 2 seconds.     Coloration: Skin is not cyanotic or jaundiced.  Neurological:     General: No focal deficit present.     Mental Status: She is alert and oriented to person, place, and time.  Psychiatric:        Mood and Affect: Mood normal.        Behavior: Behavior normal.     Procedures  Procedures  ED Course / MDM    Medical Decision Making Amount and/or Complexity of Data Reviewed Labs: ordered. Radiology: ordered.  Risk OTC drugs. Prescription drug management.   On CT imaging, patient does have stool buildup in ascending for mild gaseous distention of the transverse and descending colon.  On  assessment, patient resting in supine position.  She endorses improved but ongoing abdominal discomfort.  She was informed of CT results.  She reports that she is still passing gas.  Patient was given additional IV fluids for her tachycardia.  Heart rate remained elevated in the 110s.  She underwent a CTA to assess for possible PE following her recent surgery.  CTA was negative.  Her blood sugar today is elevated and she states that it does stay in the 200s at home.  She may be dehydrated from ongoing hyperglycemia.  While in the ED, patient had further episodes of flatus.  She would experience mild relief of her discomfort with these.  She feels that her heart rate is likely due to her discomfort.  She does request to go home and treat her constipation.  She was advised to return at any time for any worsening of symptoms.  She was discharged in stable condition.       Godfrey Pick, MD 07/28/22 [phone]

## 2022-07-28 NOTE — Discharge Instructions (Addendum)
Prescriptions for magnesium citrate and MiraLAX were sent to your pharmacy.  These can help with constipation.  Take 6-8 doses of MiraLAX initially for bowel cleanout.  After that, reduce dosing to maintain daily soft stools.  Drink plenty of water to stay hydrated.  When your blood sugar is elevated, it can be easy to become dehydrated.  Hydration will help with moving your bowels.  Take Tylenol and ibuprofen for discomfort.  Avoid narcotic medication if you can, as this will slow your bowels down.  Return to the emergency department at any time for any worsening of symptoms.

## 2022-07-28 NOTE — ED Provider Notes (Addendum)
[facility] Provider Note   CSN: [id number] Arrival date & time: 07/28/22  [id number]     History  Chief Complaint  Patient presents with   Constipation   Abdominal Pain    Desra Elich is a 52 y.o. female.  Patient presents to the emergency department for evaluation of abdominal pain and distention.  Patient reports that she had back surgery 4 days ago.  She reports that she has not had a bowel movement since a couple of days before the surgery.  She has been taking pain medication for the back.  Patient reports nausea and progressive distention of her abdomen that began last night.       Home Medications Prior to Admission medications   Medication Sig Start Date End Date Taking? Authorizing Provider  amLODipine (NORVASC) 10 MG tablet Take 10 mg by mouth in the morning.    [provider]  cyanocobalamin (VITAMIN B12) 1000 MCG tablet Take 1,000 mcg by mouth in the morning.    [provider]  cyclobenzaprine (FLEXERIL) 10 MG tablet Take 1 tablet (10 mg total) by mouth 3 (three) times daily as needed for muscle spasms. 07/25/22   Meyran, Ocie Cornfield, NP  glipiZIDE (GLUCOTROL) 10 MG tablet Take 10 mg by mouth in the morning and at bedtime. 02/05/22   [provider]  HYDROcodone-acetaminophen (NORCO/VICODIN) 5-325 MG tablet Take 1 tablet by mouth every 4 (four) hours as needed for severe pain ((score 7 to 10)). 07/25/22   Meyran, Ocie Cornfield, NP  insulin isophane & regular human KwikPen (HUMULIN 70/30 KWIKPEN) (70-30) 100 UNIT/ML KwikPen Inject 25-35 Units into the skin See admin instructions. Inject 35 units subcutaneously in the morning & inject 25 units subcutaneously at night 06/05/22   [provider]  losartan-hydrochlorothiazide (HYZAAR) 100-12.5 MG tablet Take 1 tablet by mouth in the morning. 03/04/22   [provider]  medroxyPROGESTERone (PROVERA) 10 MG tablet Take 10 mg by mouth in  the morning. 04/09/22   [provider]  omeprazole (PRILOSEC) 40 MG capsule Take 40 mg by mouth daily before breakfast. 02/05/18   [provider]      Allergies    Lisinopril    Review of Systems   Review of Systems  Physical Exam Updated Vital Signs BP (!) 179/105   Pulse (!) 114   Temp 98.5 F (36.9 C) (Oral)   Resp 16   Ht 5\' 4"  (1.626 m)   Wt 91.6 kg   LMP 06/10/2022 (Within Weeks) Comment: states she is pre-menopausal  SpO2 97%   BMI 34.67 kg/m  Physical Exam Vitals and nursing note reviewed.  Constitutional:      General: She is not in acute distress.    Appearance: She is well-developed.  HENT:     Head: Normocephalic and atraumatic.     Mouth/Throat:     Mouth: Mucous membranes are moist.  Eyes:     General: Vision grossly intact. Gaze aligned appropriately.     Extraocular Movements: Extraocular movements intact.     Conjunctiva/sclera: Conjunctivae normal.  Cardiovascular:     Rate and Rhythm: Normal rate and regular rhythm.     Pulses: Normal pulses.     Heart sounds: Normal heart sounds, S1 normal and S2 normal. No murmur heard.    No friction rub. No gallop.  Pulmonary:     Effort: Pulmonary effort is normal. No respiratory distress.     Breath sounds: Normal breath sounds.  Abdominal:     General: Bowel sounds are decreased. There is distension.     Palpations: Abdomen is soft.     Tenderness: There is generalized abdominal tenderness. There is no guarding or rebound.     Hernia: No hernia is present.  Musculoskeletal:        General: No swelling.     Cervical back: Full passive range of motion without pain, normal range of motion and neck supple. No spinous process tenderness or muscular tenderness. Normal range of motion.     Right lower leg: No edema.     Left lower leg: No edema.  Skin:    General: Skin is warm and dry.     Capillary Refill: Capillary refill takes less than 2 seconds.     Findings: No ecchymosis, erythema,  rash or wound.     Comments: Surgical site intact - no erythema, bleeding or drainage  Neurological:     General: No focal deficit present.     Mental Status: She is alert and oriented to person, place, and time.     GCS: GCS eye subscore is 4. GCS verbal subscore is 5. GCS motor subscore is 6.     Cranial Nerves: Cranial nerves 2-12 are intact.     Sensory: Sensation is intact.     Motor: Motor function is intact.     Coordination: Coordination is intact.  Psychiatric:        Attention and Perception: Attention normal.        Mood and Affect: Mood normal.        Speech: Speech normal.        Behavior: Behavior normal.     ED Results / Procedures / Treatments   Labs (all labs ordered are listed, but only abnormal results are displayed) Labs Reviewed  URINALYSIS, ROUTINE W REFLEX MICROSCOPIC - Abnormal; Notable for the following components:      Result Value   APPearance HAZY (*)    Glucose, UA >=500 (*)    Protein, ur 30 (*)    Bacteria, UA RARE (*)    All other components within normal limits  CBC WITH DIFFERENTIAL/PLATELET - Abnormal; Notable for the following components:   Hemoglobin 9.1 (*)    HCT 30.9 (*)    MCV 66.7 (*)    MCH 19.7 (*)    MCHC 29.4 (*)    RDW 19.6 (*)    All other components within normal limits  BASIC METABOLIC PANEL - Abnormal; Notable for the following components:   Sodium 130 (*)    Chloride 93 (*)    Glucose, Bld 303 (*)    BUN 23 (*)    Creatinine, Ser 1.03 (*)    All other components within normal limits  I-STAT BETA HCG BLOOD, ED ([facility], [facility], [facility] ONLY) - Abnormal; Notable for the following components:   I-stat hCG, quantitative 10.6 (*)    All other components within normal limits  LIPASE, BLOOD  HCG, SERUM, QUALITATIVE    EKG None  Radiology No results found.  Procedures Procedures    Medications Ordered in ED Medications  sodium chloride 0.9 % bolus 500 mL (has no administration in time range)  ondansetron (ZOFRAN) injection  4 mg (has no administration in time range)  morphine (PF) 4 MG/ML injection 4 mg (has no administration in time range)  sodium chloride 0.9 % bolus 500 mL (0 mLs Intravenous Stopped 07/28/22 [id number])    ED Course/ Medical Decision Making/ A&P  Medical Decision Making Amount and/or Complexity of Data Reviewed Labs: ordered. Decision-making details documented in ED Course. Radiology: ordered.   Differential Diagnosis considered includes, but not limited to: Cholelithiasis; cholecystitis; cholangitis; bowel obstruction; esophagitis; gastritis; pancreatitis.   Presents to the emergency department with concerns over progressively worsening abdominal distention and lack of bowel movement.  Patient reports that she underwent surgery earlier this week.  She had not had a bowel movement for 2 days at time of surgery and still has not had one.  She has been taking narcotic analgesia.  Small bowel obstruction, ileus secondary to surgery is possible.  Additional possibility will be constipation secondary to narcotic analgesia.  Less likely would be complications of surgery including mass effect on spinal cord.  Initial lab work is unremarkable.  Will sign out to oncoming ER physician with CT scan pending.        Final Clinical Impression(s) / ED Diagnoses Final diagnoses:  Generalized abdominal pain    Rx / DC Orders ED Discharge Orders     None         Karmella Bouvier, Gwenyth Allegra, MD 07/28/22 [id number]    Orpah Greek, MD 07/28/22 [phone]

## 2022-08-03 ENCOUNTER — Emergency Department (HOSPITAL_COMMUNITY)
Admission: EM | Admit: 2022-08-03 | Discharge: 2022-08-03 | Disposition: A | Discharge status: Home / Self Care | Payer: Managed Care, Other (non HMO) | Attending: Emergency Medicine | Admitting: Emergency Medicine

## 2022-08-03 ENCOUNTER — Other Ambulatory Visit: Payer: Self-pay

## 2022-08-03 ENCOUNTER — Emergency Department (HOSPITAL_COMMUNITY): Payer: Managed Care, Other (non HMO)

## 2022-08-03 DIAGNOSIS — K5903 Drug induced constipation: Secondary | ICD-10-CM | POA: Diagnosis present

## 2022-08-03 DIAGNOSIS — R0602 Shortness of breath: Secondary | ICD-10-CM | POA: Insufficient documentation

## 2022-08-03 DIAGNOSIS — R110 Nausea: Secondary | ICD-10-CM | POA: Insufficient documentation

## 2022-08-03 DIAGNOSIS — R739 Hyperglycemia, unspecified: Secondary | ICD-10-CM | POA: Insufficient documentation

## 2022-08-03 DIAGNOSIS — Z794 Long term (current) use of insulin: Secondary | ICD-10-CM | POA: Insufficient documentation

## 2022-08-03 LAB — URINALYSIS, ROUTINE W REFLEX MICROSCOPIC
Bilirubin Urine: NEGATIVE
Glucose, UA: >=500 mg/dL — AB
Hgb urine dipstick: NEGATIVE
Ketones, ur: NEGATIVE mg/dL
Nitrite: NEGATIVE
Protein, ur: 100 mg/dL — AB
Specific Gravity, Urine: 1.031 — ABNORMAL HIGH (ref 1.005–1.030)
pH: 5 (ref 5.0–8.0)

## 2022-08-03 LAB — COMPREHENSIVE METABOLIC PANEL
ALT: 9 U/L (ref 0–44)
AST: 14 U/L — ABNORMAL LOW (ref 15–41)
Albumin: 3.2 g/dL — ABNORMAL LOW (ref 3.5–5.0)
Alkaline Phosphatase: 68 U/L (ref 38–126)
Anion gap: 16 — ABNORMAL HIGH (ref 5–15)
BUN: 10 mg/dL (ref 6–20)
CO2: 26 mmol/L (ref 22–32)
Calcium: 9.4 mg/dL (ref 8.9–10.3)
Chloride: 92 mmol/L — ABNORMAL LOW (ref 98–111)
Creatinine, Ser: 0.85 mg/dL (ref 0.44–1.00)
GFR, Estimated: >60 mL/min (ref >60)
Glucose, Bld: 265 mg/dL — ABNORMAL HIGH (ref 70–99)
Potassium: 3.5 mmol/L (ref 3.5–5.1)
Sodium: 134 mmol/L — ABNORMAL LOW (ref 135–145)
Total Bilirubin: 0.2 mg/dL — ABNORMAL LOW (ref 0.3–1.2)
Total Protein: 7.5 g/dL (ref 6.5–8.1)

## 2022-08-03 LAB — CBG MONITORING, ED: Glucose-Capillary: 252 mg/dL — ABNORMAL HIGH (ref 70–99)

## 2022-08-03 LAB — CBC
HCT: 33.8 % — ABNORMAL LOW (ref 36.0–46.0)
Hemoglobin: 9.9 g/dL — ABNORMAL LOW (ref 12.0–15.0)
MCH: 19.7 pg — ABNORMAL LOW (ref 26.0–34.0)
MCHC: 29.3 g/dL — ABNORMAL LOW (ref 30.0–36.0)
MCV: 67.2 fL — ABNORMAL LOW (ref 80.0–100.0)
Platelets: 421 10*3/uL — ABNORMAL HIGH (ref 150–400)
RBC: 5.03 MIL/uL (ref 3.87–5.11)
RDW: 19.5 % — ABNORMAL HIGH (ref 11.5–15.5)
WBC: 7.5 10*3/uL (ref 4.0–10.5)
nRBC: 0 % (ref 0.0–0.2)

## 2022-08-03 LAB — PREGNANCY, URINE: Preg Test, Ur: NEGATIVE

## 2022-08-03 MED ORDER — ONDANSETRON 4 MG PO TBDP: ORAL_TABLET | ORAL | 0 refills | Status: DC

## 2022-08-03 MED ORDER — MORPHINE SULFATE (PF) 4 MG/ML IV SOLN
4.0000 mg | Freq: Once | INTRAVENOUS | Status: AC
  Administered 2022-08-03: 4 mg via INTRAVENOUS
  Filled 2022-08-03: qty 1

## 2022-08-03 MED ORDER — SODIUM CHLORIDE 0.9 % IV BOLUS
1000.0000 mL | Freq: Once | INTRAVENOUS | Status: AC
  Administered 2022-08-03: 1000 mL via INTRAVENOUS

## 2022-08-03 NOTE — Discharge Instructions (Addendum)
Please return to the ED with any new or worsening signs or symptoms Please follow-up with your neurosurgeon, Dr. Wynetta Emery, for further evaluation on Tuesday as we discussed Please see the attached guide concerning constipation.  Please when you arrive home take 8 capfuls of MiraLAX mixed with Gatorade.  If this does not provide desired results, please purchase enema from drugstore Please continue taking blood pressure medication, diabetic medication as prescribed.

## 2022-08-03 NOTE — ED Triage Notes (Signed)
Pt had lumbar spine surgery x 1 week ago; since then, c/o HTN, hyperglycemia, L leg pain, and sob; had not diabetes meds x  3 days, taking BP meds as prescribed; also c/o constipation x 4 days

## 2022-08-03 NOTE — ED Provider Notes (Signed)
[facility]   CSN: [id number] Arrival date & time: 08/03/22  1149     History No chief complaint on file.   Jill Holt is a 52 y.o. female who was recently discharged from the hospital on 3/28.  Patient was admitted on 3/27 for decompression of L3-4, L4-5 by neurosurgery Dr. Wynetta Emeryram.  Patient was additionally seen on 3/31 for generalized abdominal pain.  During this visit, the patient was noted to be tachycardic and she had unremarkable CTA to rule out blood clots.  Patient also had CT abdomen pelvis done at this time which did show moderate colonic stool burden which the patient attributes to her narcotic use.  Patient reports that she has been suffering from constipation ever since having her initial surgery on 3/27.  Patient is here today complaining of hyperglycemia, shortness of breath, generalized pain from her surgical site.  The patient denies chest pain, fevers, dysuria, flank pain, urinary or bladder incontinence, leg weakness, groin numbness.  Patient reports that she has not been able to take her blood pressure medication for the last couple of days because she feels as if every time she swallows something she feels sick on her stomach.  Patient reports she has been compliant on her blood pressure medication.  Patient was seen by home health this morning and advised to come to ED for evaluation due to her shortness of breath, constipation.   HPI     Home Medications Prior to Admission medications   Medication Sig Start Date End Date Taking? Authorizing Provider  amLODipine (NORVASC) 10 MG tablet Take 10 mg by mouth in the morning.    [provider]  cyanocobalamin (VITAMIN B12) 1000 MCG tablet Take 1,000 mcg by mouth in the morning.    [provider]  cyclobenzaprine (FLEXERIL) 10 MG tablet Take 1 tablet (10 mg total) by mouth 3 (three) times daily as needed for muscle spasms. 07/25/22   Meyran, Tiana LoftKimberly  Hannah, NP  glipiZIDE (GLUCOTROL) 10 MG tablet Take 10 mg by mouth in the morning and at bedtime. 02/05/22   [provider]  HYDROcodone-acetaminophen (NORCO/VICODIN) 5-325 MG tablet Take 1 tablet by mouth every 4 (four) hours as needed for severe pain ((score 7 to 10)). 07/25/22   Meyran, Tiana LoftKimberly Hannah, NP  insulin isophane & regular human KwikPen (HUMULIN 70/30 KWIKPEN) (70-30) 100 UNIT/ML KwikPen Inject 25-35 Units into the skin See admin instructions. Inject 35 units subcutaneously in the morning & inject 25 units subcutaneously at night 06/05/22   [provider]  losartan-hydrochlorothiazide (HYZAAR) 100-12.5 MG tablet Take 1 tablet by mouth in the morning. 03/04/22   [provider]  medroxyPROGESTERone (PROVERA) 10 MG tablet Take 10 mg by mouth in the morning. 04/09/22   [provider]  omeprazole (PRILOSEC) 40 MG capsule Take 40 mg by mouth daily before breakfast. 02/05/18   [provider]  polyethylene glycol (MIRALAX) 17 g packet Take 17 g by mouth daily. Take additional doses initially for bowel cleanout. 07/28/22   Gloris Manchesterixon, Ryan, MD      Allergies    Lisinopril    Review of Systems   Review of Systems  Gastrointestinal:  Positive for constipation and nausea. Negative for vomiting.  All other systems reviewed and are negative.   Physical Exam Updated Vital Signs BP (!) 161/100   Pulse 97   Temp 99 F (37.2 C) (Oral)   Resp 15   LMP 06/10/2022 (Within Weeks) Comment:  states she is pre-menopausal  SpO2 100%  Physical Exam Vitals and nursing note reviewed.  Constitutional:      General: She is not in acute distress.    Appearance: She is well-developed.  HENT:     Head: Normocephalic and atraumatic.     Mouth/Throat:     Mouth: Mucous membranes are moist.     Pharynx: Oropharynx is clear.  Eyes:     Conjunctiva/sclera: Conjunctivae normal.  Cardiovascular:     Rate and Rhythm: Normal rate and regular rhythm.     Heart  sounds: No murmur heard. Pulmonary:     Effort: Pulmonary effort is normal. No respiratory distress.     Breath sounds: Normal breath sounds.  Abdominal:     Palpations: Abdomen is soft.     Tenderness: There is no abdominal tenderness.  Musculoskeletal:        General: No swelling.     Cervical back: Neck supple.  Skin:    General: Skin is warm and dry.     Capillary Refill: Capillary refill takes less than 2 seconds.       Neurological:     General: No focal deficit present.     Mental Status: She is alert and oriented to person, place, and time.     GCS: GCS eye subscore is 4. GCS verbal subscore is 5. GCS motor subscore is 6.     Cranial Nerves: Cranial nerves 2-12 are intact. No cranial nerve deficit.     Sensory: Sensation is intact. No sensory deficit.     Motor: Motor function is intact. No weakness.     Coordination: Coordination is intact. [facility] normal.  Psychiatric:        Mood and Affect: Mood normal.     ED Results / Procedures / Treatments   Labs (all labs ordered are listed, but only abnormal results are displayed) Labs Reviewed  CBC - Abnormal; Notable for the following components:      Result Value   Hemoglobin 9.9 (*)    HCT 33.8 (*)    MCV 67.2 (*)    MCH 19.7 (*)    MCHC 29.3 (*)    RDW 19.5 (*)    Platelets 421 (*)    All other components within normal limits  URINALYSIS, ROUTINE W REFLEX MICROSCOPIC - Abnormal; Notable for the following components:   APPearance CLOUDY (*)    Specific Gravity, Urine 1.031 (*)    Glucose, UA >=500 (*)    Protein, ur 100 (*)    Leukocytes,Ua SMALL (*)    Bacteria, UA RARE (*)    All other components within normal limits  COMPREHENSIVE METABOLIC PANEL - Abnormal; Notable for the following components:   Sodium 134 (*)    Chloride 92 (*)    Glucose, Bld 265 (*)    Albumin 3.2 (*)    AST 14 (*)    Total Bilirubin 0.2 (*)    Anion gap 16 (*)    All other components within normal limits  CBG  MONITORING, ED - Abnormal; Notable for the following components:   Glucose-Capillary 252 (*)    All other components within normal limits  PREGNANCY, URINE    EKG EKG Interpretation  Date/Time:  Saturday August 03 2022 12:03:41 EDT Ventricular Rate:  104 PR Interval:  132 QRS Duration: 66 QT Interval:  347 QTC Calculation: 457 R Axis:   14 Text Interpretation: Sinus tachycardia Abnormal R-wave progression, early transition Borderline T abnormalities,  inferior leads No significant change since last tracing Confirmed by Melene Plan [phone]) on 08/03/2022 2:49:14 PM  Radiology DG Abdomen 1 View  Result Date: 08/03/2022 CLINICAL DATA:  Shortness of breath and constipation EXAM: CHEST - 2 VIEW; ABDOMEN - 1 VIEW COMPARISON:  None Available. FINDINGS: Cardiac and mediastinal contours are within normal limits. Both lungs are clear. The visualized skeletal structures are unremarkable. Nonobstructive bowel-gas pattern. Moderate stool burden. Partially visualized intramedullary rod of the left femur. IMPRESSION: 1. No acute cardiopulmonary process. 2. Nonobstructive bowel-gas pattern. Moderate stool burden. Electronically Signed   By: Allegra Lai M.D.   On: 08/03/2022 13:19   DG Chest 2 View  Result Date: 08/03/2022 CLINICAL DATA:  Shortness of breath and constipation EXAM: CHEST - 2 VIEW; ABDOMEN - 1 VIEW COMPARISON:  None Available. FINDINGS: Cardiac and mediastinal contours are within normal limits. Both lungs are clear. The visualized skeletal structures are unremarkable. Nonobstructive bowel-gas pattern. Moderate stool burden. Partially visualized intramedullary rod of the left femur. IMPRESSION: 1. No acute cardiopulmonary process. 2. Nonobstructive bowel-gas pattern. Moderate stool burden. Electronically Signed   By: Allegra Lai M.D.   On: 08/03/2022 13:19    Procedures Procedures   Medications Ordered in ED Medications  sodium chloride 0.9 % bolus 1,000 mL (1,000 mLs Intravenous New  Bag/Given 08/03/22 1247)  morphine (PF) 4 MG/ML injection 4 mg (4 mg Intravenous Given 08/03/22 1247)    ED Course/ Medical Decision Making/ A&P   Medical Decision Making Amount and/or Complexity of Data Reviewed Labs: ordered. Radiology: ordered.  Risk Prescription drug management.   52 year old female presents to ED for evaluation.  Please see HPI for further details.  On examination patient is afebrile and nontachycardic.  Lung sounds are clear bilaterally, she is not hypoxic.  Her abdomen is soft and compressible.  Neurological examination without focal neurodeficits.  CBC with no leukocytosis, baseline hemoglobin.  CMP with sodium 134, anion gap 16.  Patient treated with 1 L normal saline.  Urinalysis unremarkable.  Patient denies dysuria.  Patient CBG at 252.  Patient plain film imaging of chest shows no consolidations or effusions.  Patient plan for imaging of abdomen shows nonobstructive bowel gas pattern with moderate colonic stool burden.  Patient given 4 mg morphine for pain control.  Patient reports he feels better after this was given.  At this time patient will be discharged home.  Patient will discharge home with Zofran for her nausea.  The patient will be advised to follow-up with her neurosurgeon for further management/reevaluation of her spinal incision.  The patient was given return precautions to include fevers, lower extremity weakness, bodyaches or chills.  Patient voiced understanding.  Patient discharged in stable condition.   Final Clinical Impression(s) / ED Diagnoses Final diagnoses:  Drug-induced constipation    Rx / DC Orders ED Discharge Orders     None         Al Decant, PA-C 08/03/22 1509    Melene Plan, DO 08/03/22 1524

## 2023-02-17 ENCOUNTER — Other Ambulatory Visit: Payer: Self-pay | Admitting: Neurosurgery

## 2023-02-25 NOTE — Pre-Procedure Instructions (Signed)
Surgical Instructions   Your procedure is scheduled on March 03, 2023. Report to [facility] Main Entrance "A" at 9:40 A.M., then check in with the Admitting office. Any questions or running late day of surgery: call [phone]  Questions prior to your surgery date: call [phone], Monday-Friday, 8am-4pm. If you experience any cold or flu symptoms such as cough, fever, chills, shortness of breath, etc. between now and your scheduled surgery, please notify us at the above number.     Remember:  Do not eat or drink after midnight the night before your surgery    Take these medicines the morning of surgery with A SIP OF WATER: amLODipine (NORVASC)  atorvastatin (LIPITOR)  gabapentin (NEURONTIN)  metoprolol succinate (TOPROL-XL)  omeprazole (PRILOSEC)    May take these medicines IF NEEDED: cyclobenzaprine (FLEXERIL)  ondansetron (ZOFRAN-ODT)    One week prior to surgery, STOP taking any Aspirin (unless otherwise instructed by your surgeon) Aleve, Naproxen, Ibuprofen, Motrin, Advil, Goody's, BC's, all herbal medications, fish oil, and non-prescription vitamins.   WHAT DO I DO ABOUT MY DIABETES MEDICATION?   Do not take glipiZIDE (GLUCOTROL XL) the evening before surgery or the morning of surgery.   THE NIGHT BEFORE SURGERY, take 17.5 units of insulin isophane & regular human KwikPen (HUMULIN 70/30 KWIKPEN).      DO NOT take any insulin isophane & regular human KwikPen (HUMULIN 70/30 KWIKPEN) (70-30) the morning of surgery.    HOW TO MANAGE YOUR DIABETES BEFORE AND AFTER SURGERY  Why is it important to control my blood sugar before and after surgery? Improving blood sugar levels before and after surgery helps healing and can limit problems. A way of improving blood sugar control is eating a healthy diet by:  Eating less sugar and carbohydrates  Increasing activity/exercise  Talking with your doctor about reaching your blood sugar goals High blood sugars (greater  than 180 mg/dL) can raise your risk of infections and slow your recovery, so you will need to focus on controlling your diabetes during the weeks before surgery. Make sure that the doctor who takes care of your diabetes knows about your planned surgery including the date and location.  How do I manage my blood sugar before surgery? Check your blood sugar at least 4 times a day, starting 2 days before surgery, to make sure that the level is not too high or low.  Check your blood sugar the morning of your surgery when you wake up and every 2 hours until you get to the Short Stay unit.  If your blood sugar is less than 70 mg/dL, you will need to treat for low blood sugar: Do not take insulin. Treat a low blood sugar (less than 70 mg/dL) with  cup of clear juice (cranberry or apple), 4 glucose tablets, OR glucose gel. Recheck blood sugar in 15 minutes after treatment (to make sure it is greater than 70 mg/dL). If your blood sugar is not greater than 70 mg/dL on recheck, call [phone] for further instructions. Report your blood sugar to the short stay nurse when you get to Short Stay.  If you are admitted to the hospital after surgery: Your blood sugar will be checked by the staff and you will probably be given insulin after surgery (instead of oral diabetes medicines) to make sure you have good blood sugar levels. The goal for blood sugar control after surgery is 80-180 mg/dL.  Do NOT Smoke (Tobacco/Vaping) for 24 hours prior to your procedure.  If you use a CPAP at night, you may bring your mask/headgear for your overnight stay.   You will be asked to remove any contacts, glasses, piercing's, hearing aid's, dentures/partials prior to surgery. Please bring cases for these items if needed.    Patients discharged the day of surgery will not be allowed to drive home, and someone needs to stay with them for 24 hours.  SURGICAL WAITING ROOM VISITATION Patients may have no  more than 2 support people in the waiting area - these visitors may rotate.   Pre-op nurse will coordinate an appropriate time for 1 ADULT support person, who may not rotate, to accompany patient in pre-op.  Children under the age of 30 must have an adult with them who is not the patient and must remain in the main waiting area with an adult.  If the patient needs to stay at the hospital during part of their recovery, the visitor guidelines for inpatient rooms apply.  Please refer to the [facility] website for the visitor guidelines for any additional information.   If you received a COVID test during your pre-op visit  it is requested that you wear a mask when out in public, stay away from anyone that may not be feeling well and notify your surgeon if you develop symptoms. If you have been in contact with anyone that has tested positive in the last 10 days please notify you surgeon.      Pre-operative 5 CHG Bathing Instructions   You can play a key role in reducing the risk of infection after surgery. Your skin needs to be as free of germs as possible. You can reduce the number of germs on your skin by washing with CHG (chlorhexidine gluconate) soap before surgery. CHG is an antiseptic soap that kills germs and continues to kill germs even after washing.   DO NOT use if you have an allergy to chlorhexidine/CHG or antibacterial soaps. If your skin becomes reddened or irritated, stop using the CHG and notify one of our RNs at [phone].   Please shower with the CHG soap starting 4 days before surgery using the following schedule:     Please keep in mind the following:  DO NOT shave, including legs and underarms, starting the day of your first shower.   You may shave your face at any point before/day of surgery.  Place clean sheets on your bed the day you start using CHG soap. Use a clean washcloth (not used since being washed) for each shower. DO NOT sleep with pets once you start  using the CHG.   CHG Shower Instructions:  Wash your face and private area with normal soap. If you choose to wash your hair, wash first with your normal shampoo.  After you use shampoo/soap, rinse your hair and body thoroughly to remove shampoo/soap residue.  Turn the water OFF and apply about 3 tablespoons (45 ml) of CHG soap to a CLEAN washcloth.  Apply CHG soap ONLY FROM YOUR NECK DOWN TO YOUR TOES (washing for 3-5 minutes)  DO NOT use CHG soap on face, private areas, open wounds, or sores.  Pay special attention to the area where your surgery is being performed.  If you are having back surgery, having someone wash your back for you may be helpful. Wait 2 minutes after CHG soap is applied, then you may rinse off the CHG soap.  Pat dry with a  clean towel  Put on clean clothes/pajamas   If you choose to wear lotion, please use ONLY the CHG-compatible lotions on the back of this paper.   Additional instructions for the day of surgery: DO NOT APPLY any lotions, deodorants, cologne, or perfumes.   Do not bring valuables to the hospital. [facility] is not responsible for any belongings/valuables. Do not wear nail polish, gel polish, artificial nails, or any other type of covering on natural nails (fingers and toes) Do not wear jewelry or makeup Put on clean/comfortable clothes.  Please brush your teeth.  Ask your nurse before applying any prescription medications to the skin.     CHG Compatible Lotions   Aveeno Moisturizing lotion  Cetaphil Moisturizing Cream  Cetaphil Moisturizing Lotion  Clairol Herbal Essence Moisturizing Lotion, Dry Skin  Clairol Herbal Essence Moisturizing Lotion, Extra Dry Skin  Clairol Herbal Essence Moisturizing Lotion, Normal Skin  Curel Age Defying Therapeutic Moisturizing Lotion with Alpha Hydroxy  Curel Extreme Care Body Lotion  Curel Soothing Hands Moisturizing Hand Lotion  Curel Therapeutic Moisturizing Cream, Fragrance-Free  Curel Therapeutic  Moisturizing Lotion, Fragrance-Free  Curel Therapeutic Moisturizing Lotion, Original Formula  Eucerin Daily Replenishing Lotion  Eucerin Dry Skin Therapy Plus Alpha Hydroxy Crme  Eucerin Dry Skin Therapy Plus Alpha Hydroxy Lotion  Eucerin Original Crme  Eucerin Original Lotion  Eucerin Plus Crme Eucerin Plus Lotion  Eucerin TriLipid Replenishing Lotion  Keri Anti-Bacterial Hand Lotion  Keri Deep Conditioning Original Lotion Dry Skin Formula Softly Scented  Keri Deep Conditioning Original Lotion, Fragrance Free Sensitive Skin Formula  Keri Lotion Fast Absorbing Fragrance Free Sensitive Skin Formula  Keri Lotion Fast Absorbing Softly Scented Dry Skin Formula  Keri Original Lotion  Keri Skin Renewal Lotion Keri Silky Smooth Lotion  Keri Silky Smooth Sensitive Skin Lotion  Nivea Body Creamy Conditioning Oil  Nivea Body Extra Enriched Lotion  Nivea Body Original Lotion  Nivea Body Sheer Moisturizing Lotion Nivea Crme  Nivea Skin Firming Lotion  NutraDerm 30 Skin Lotion  NutraDerm Skin Lotion  NutraDerm Therapeutic Skin Cream  NutraDerm Therapeutic Skin Lotion  ProShield Protective Hand Cream  Provon moisturizing lotion  Please read over the following fact sheets that you were given.

## 2023-02-26 ENCOUNTER — Inpatient Hospital Stay (HOSPITAL_COMMUNITY)
Admission: RE | Admit: 2023-02-26 | Discharge: 2023-02-26 | Disposition: A | Discharge status: Home / Self Care | Payer: Medicaid Other | Source: Ambulatory Visit | Attending: Neurosurgery

## 2023-02-26 ENCOUNTER — Encounter (HOSPITAL_COMMUNITY): Payer: Self-pay

## 2023-02-26 ENCOUNTER — Other Ambulatory Visit: Payer: Self-pay

## 2023-02-26 VITALS — BP 154/88 | HR 96 | Temp 98.3°F | Resp 17 | Ht 64.0 in | Wt 192.4 lb

## 2023-02-26 DIAGNOSIS — Z01812 Encounter for preprocedural laboratory examination: Secondary | ICD-10-CM | POA: Diagnosis present

## 2023-02-26 DIAGNOSIS — Z01818 Encounter for other preprocedural examination: Secondary | ICD-10-CM

## 2023-02-26 DIAGNOSIS — E119 Type 2 diabetes mellitus without complications: Secondary | ICD-10-CM | POA: Insufficient documentation

## 2023-02-26 DIAGNOSIS — Z794 Long term (current) use of insulin: Secondary | ICD-10-CM | POA: Diagnosis not present

## 2023-02-26 LAB — BASIC METABOLIC PANEL
Anion gap: 9 (ref 5–15)
BUN: 7 mg/dL (ref 6–20)
CO2: 31 mmol/L (ref 22–32)
Calcium: 9.6 mg/dL (ref 8.9–10.3)
Chloride: 98 mmol/L (ref 98–111)
Creatinine, Ser: 0.69 mg/dL (ref 0.44–1.00)
GFR, Estimated: >60 mL/min (ref >60)
Glucose, Bld: 85 mg/dL (ref 70–99)
Potassium: 3.6 mmol/L (ref 3.5–5.1)
Sodium: 138 mmol/L (ref 135–145)

## 2023-02-26 LAB — CBC
HCT: 39.1 % (ref 36.0–46.0)
Hemoglobin: 12.2 g/dL (ref 12.0–15.0)
MCH: 23.9 pg — ABNORMAL LOW (ref 26.0–34.0)
MCHC: 31.2 g/dL (ref 30.0–36.0)
MCV: 76.7 fL — ABNORMAL LOW (ref 80.0–100.0)
Platelets: 194 10*3/uL (ref 150–400)
RBC: 5.1 MIL/uL (ref 3.87–5.11)
RDW: 15.9 % — ABNORMAL HIGH (ref 11.5–15.5)
WBC: 4.3 10*3/uL (ref 4.0–10.5)
nRBC: 0 % (ref 0.0–0.2)

## 2023-02-26 LAB — TYPE AND SCREEN
ABO/RH(D): B POS
Antibody Screen: NEGATIVE

## 2023-02-26 LAB — SURGICAL PCR SCREEN
MRSA, PCR: NEGATIVE
Staphylococcus aureus: NEGATIVE

## 2023-02-26 LAB — HEMOGLOBIN A1C
Hgb A1c MFr Bld: 7.8 % — ABNORMAL HIGH (ref 4.8–5.6)
Mean Plasma Glucose: 177.16 mg/dL

## 2023-02-26 LAB — GLUCOSE, CAPILLARY: Glucose-Capillary: 127 mg/dL — ABNORMAL HIGH (ref 70–99)

## 2023-02-26 NOTE — Progress Notes (Signed)
PCP - Dr. Rudy Jew Podraza Cardiologist - Denies Endocrinologist - Dr. Nile Dear  PPM/ICD - Denies Device Orders - n/a Rep Notified - n/a  Chest x-ray - 11/02/2022 EKG - 08/03/2022 Stress Test - Per pt, had one 7+ years ago at [facility]. Result normal. ECHO - Denies Cardiac Cath - Denies  Sleep Study - Denies CPAP - n/a  Pt is DM2. She has a Dexcom on left arm. Normal fasting glucose is 160s. CBG at pre-op 127. A1c result pending.  Last dose of GLP1 agonist-  n/a GLP1 instructions: n/a   Blood Thinner Instructions: n/a Aspirin Instructions: n/a  NPO after midnight  COVID TEST- n/a   Anesthesia review: No. Pt does endorse bilateral swelling in lower legs. This started over the summer with taking Gabapentin. No additional symptoms and swelling is stable. Discussed with Antionette Poles, PA-C.   Patient denies shortness of breath, fever, cough and chest pain at PAT appointment. Pt denies any respiratory illness/infection in the last two months.    All instructions explained to the patient, with a verbal understanding of the material. Patient agrees to go over the instructions while at home for a better understanding. Patient also instructed to self quarantine after being tested for COVID-19. The opportunity to ask questions was provided.

## 2023-03-03 ENCOUNTER — Inpatient Hospital Stay (HOSPITAL_COMMUNITY): Payer: Medicaid Other

## 2023-03-03 ENCOUNTER — Other Ambulatory Visit: Payer: Self-pay

## 2023-03-03 ENCOUNTER — Inpatient Hospital Stay (HOSPITAL_COMMUNITY)
Admission: RE | Admit: 2023-03-03 | Discharge: 2023-03-07 | DRG: 428 | Disposition: A | Discharge status: Rehabilitation Facility | Payer: Medicaid Other | Source: Ambulatory Visit | Attending: Neurosurgery | Admitting: Neurosurgery

## 2023-03-03 ENCOUNTER — Encounter (HOSPITAL_COMMUNITY)
Admission: RE | Disposition: A | Discharge status: Rehabilitation Facility | Payer: Self-pay | Source: Ambulatory Visit | Attending: Neurosurgery

## 2023-03-03 ENCOUNTER — Encounter (HOSPITAL_COMMUNITY): Payer: Self-pay | Admitting: Neurosurgery

## 2023-03-03 DIAGNOSIS — G8220 Paraplegia, unspecified: Secondary | ICD-10-CM | POA: Diagnosis not present

## 2023-03-03 DIAGNOSIS — M5416 Radiculopathy, lumbar region: Secondary | ICD-10-CM

## 2023-03-03 DIAGNOSIS — M21371 Foot drop, right foot: Secondary | ICD-10-CM | POA: Diagnosis present

## 2023-03-03 DIAGNOSIS — Z79899 Other long term (current) drug therapy: Secondary | ICD-10-CM

## 2023-03-03 DIAGNOSIS — Z888 Allergy status to other drugs, medicaments and biological substances status: Secondary | ICD-10-CM | POA: Diagnosis not present

## 2023-03-03 DIAGNOSIS — K5901 Slow transit constipation: Secondary | ICD-10-CM | POA: Diagnosis not present

## 2023-03-03 DIAGNOSIS — I10 Essential (primary) hypertension: Secondary | ICD-10-CM | POA: Diagnosis present

## 2023-03-03 DIAGNOSIS — Z8616 Personal history of COVID-19: Secondary | ICD-10-CM | POA: Diagnosis not present

## 2023-03-03 DIAGNOSIS — M21372 Foot drop, left foot: Secondary | ICD-10-CM | POA: Diagnosis present

## 2023-03-03 DIAGNOSIS — K219 Gastro-esophageal reflux disease without esophagitis: Secondary | ICD-10-CM | POA: Diagnosis present

## 2023-03-03 DIAGNOSIS — G8929 Other chronic pain: Secondary | ICD-10-CM | POA: Diagnosis not present

## 2023-03-03 DIAGNOSIS — Z794 Long term (current) use of insulin: Secondary | ICD-10-CM

## 2023-03-03 DIAGNOSIS — M5116 Intervertebral disc disorders with radiculopathy, lumbar region: Principal | ICD-10-CM | POA: Diagnosis present

## 2023-03-03 DIAGNOSIS — M48061 Spinal stenosis, lumbar region without neurogenic claudication: Secondary | ICD-10-CM | POA: Diagnosis present

## 2023-03-03 DIAGNOSIS — Z01818 Encounter for other preprocedural examination: Secondary | ICD-10-CM

## 2023-03-03 DIAGNOSIS — D62 Acute posthemorrhagic anemia: Secondary | ICD-10-CM | POA: Diagnosis not present

## 2023-03-03 DIAGNOSIS — E119 Type 2 diabetes mellitus without complications: Secondary | ICD-10-CM | POA: Diagnosis present

## 2023-03-03 DIAGNOSIS — G8918 Other acute postprocedural pain: Secondary | ICD-10-CM | POA: Diagnosis not present

## 2023-03-03 DIAGNOSIS — Z7984 Long term (current) use of oral hypoglycemic drugs: Secondary | ICD-10-CM | POA: Diagnosis not present

## 2023-03-03 DIAGNOSIS — E785 Hyperlipidemia, unspecified: Secondary | ICD-10-CM | POA: Diagnosis present

## 2023-03-03 HISTORY — DX: Other retention of urine: R33.8

## 2023-03-03 HISTORY — DX: Constipation, unspecified: K59.00

## 2023-03-03 LAB — GLUCOSE, CAPILLARY
Glucose-Capillary: 107 mg/dL — ABNORMAL HIGH (ref 70–99)
Glucose-Capillary: 94 mg/dL (ref 70–99)
Glucose-Capillary: 94 mg/dL (ref 70–99)
Glucose-Capillary: 149 mg/dL — ABNORMAL HIGH (ref 70–99)
Glucose-Capillary: 167 mg/dL — ABNORMAL HIGH (ref 70–99)
Glucose-Capillary: 324 mg/dL — ABNORMAL HIGH (ref 70–99)

## 2023-03-03 LAB — ABO/RH: ABO/RH(D): B POS

## 2023-03-03 LAB — POCT PREGNANCY, URINE: Preg Test, Ur: NEGATIVE

## 2023-03-03 SURGERY — POSTERIOR LUMBAR FUSION 2 LEVEL
Anesthesia: General | Site: Spine Lumbar

## 2023-03-03 MED ORDER — LIDOCAINE-EPINEPHRINE 1 %-1:100000 IJ SOLN
INTRAMUSCULAR | Status: AC
Start: 2023-03-03 — End: ?
  Filled 2023-03-03: qty 1

## 2023-03-03 MED ORDER — CEFAZOLIN SODIUM-DEXTROSE 2-4 GM/100ML-% IV SOLN
2.0000 g | INTRAVENOUS | Status: AC
Start: 2023-03-03 — End: 2023-03-03
  Administered 2023-03-03: 2 g via INTRAVENOUS
  Filled 2023-03-03: qty 100

## 2023-03-03 MED ORDER — MENTHOL 3 MG MT LOZG: 1.0000 | LOZENGE | OROMUCOSAL | Status: DC | PRN

## 2023-03-03 MED ORDER — CYCLOBENZAPRINE HCL 10 MG PO TABS
ORAL_TABLET | ORAL | Status: AC
  Filled 2023-03-03: qty 1

## 2023-03-03 MED ORDER — DEXAMETHASONE SODIUM PHOSPHATE 10 MG/ML IJ SOLN
INTRAMUSCULAR | Status: DC | PRN
  Administered 2023-03-03: 8 mg via INTRAVENOUS

## 2023-03-03 MED ORDER — INSULIN ASPART PROT & ASPART (70-30 MIX) 100 UNIT/ML ~~LOC~~ SUSP
35.0000 [IU] | Freq: Every day | SUBCUTANEOUS | Status: DC
  Administered 2023-03-04 – 2023-03-07 (×4): 35 [IU] via SUBCUTANEOUS

## 2023-03-03 MED ORDER — ARTIFICIAL TEARS OPHTHALMIC OINT
TOPICAL_OINTMENT | OPHTHALMIC | Status: AC
  Filled 2023-03-03: qty 3.5

## 2023-03-03 MED ORDER — FENTANYL CITRATE (PF) 250 MCG/5ML IJ SOLN
INTRAMUSCULAR | Status: DC | PRN
  Administered 2023-03-03: 100 ug via INTRAVENOUS
  Administered 2023-03-03 (×2): 50 ug via INTRAVENOUS

## 2023-03-03 MED ORDER — BISACODYL 5 MG PO TBEC
5.0000 mg | DELAYED_RELEASE_TABLET | Freq: Two times a day (BID) | ORAL | Status: DC | PRN
  Administered 2023-03-04: 10 mg via ORAL
  Administered 2023-03-04: 5 mg via ORAL
  Filled 2023-03-03: qty 2
  Filled 2023-03-03: qty 1

## 2023-03-03 MED ORDER — ONDANSETRON HCL 4 MG PO TABS
4.0000 mg | ORAL_TABLET | Freq: Four times a day (QID) | ORAL | Status: DC | PRN
Start: 2023-03-03 — End: 2023-03-07

## 2023-03-03 MED ORDER — CEFAZOLIN SODIUM-DEXTROSE 2-4 GM/100ML-% IV SOLN
2.0000 g | Freq: Three times a day (TID) | INTRAVENOUS | Status: DC
  Administered 2023-03-03 – 2023-03-04 (×2): 2 g via INTRAVENOUS
  Filled 2023-03-03 (×2): qty 100

## 2023-03-03 MED ORDER — OXYCODONE HCL 5 MG/5ML PO SOLN: 5.0000 mg | Freq: Once | ORAL | Status: DC | PRN

## 2023-03-03 MED ORDER — LACTATED RINGERS IV SOLN
INTRAVENOUS | Status: DC
Start: 2023-03-03 — End: 2023-03-03

## 2023-03-03 MED ORDER — FENTANYL CITRATE (PF) 100 MCG/2ML IJ SOLN
INTRAMUSCULAR | Status: AC
  Administered 2023-03-03: 25 ug via INTRAVENOUS
  Filled 2023-03-03: qty 2

## 2023-03-03 MED ORDER — ROCURONIUM BROMIDE 10 MG/ML (PF) SYRINGE
PREFILLED_SYRINGE | INTRAVENOUS | Status: AC
  Filled 2023-03-03: qty 10

## 2023-03-03 MED ORDER — HYDROMORPHONE HCL 1 MG/ML IJ SOLN: 0.5000 mg | INTRAMUSCULAR | Status: DC | PRN

## 2023-03-03 MED ORDER — SODIUM CHLORIDE 0.9 % IV SOLN
250.0000 mL | INTRAVENOUS | Status: AC
  Administered 2023-03-03: 250 mL via INTRAVENOUS

## 2023-03-03 MED ORDER — BUPIVACAINE HCL (PF) 0.25 % IJ SOLN
INTRAMUSCULAR | Status: AC
Start: 2023-03-03 — End: ?
  Filled 2023-03-03: qty 30

## 2023-03-03 MED ORDER — HYDROCODONE-ACETAMINOPHEN 10-325 MG PO TABS
1.0000 | ORAL_TABLET | ORAL | Status: DC | PRN
  Administered 2023-03-03 – 2023-03-07 (×7): 1 via ORAL
  Filled 2023-03-03 (×7): qty 1

## 2023-03-03 MED ORDER — ROCURONIUM BROMIDE 10 MG/ML (PF) SYRINGE
PREFILLED_SYRINGE | INTRAVENOUS | Status: DC | PRN
  Administered 2023-03-03: 10 mg via INTRAVENOUS
  Administered 2023-03-03: 30 mg via INTRAVENOUS
  Administered 2023-03-03: 70 mg via INTRAVENOUS

## 2023-03-03 MED ORDER — MIDAZOLAM HCL 2 MG/2ML IJ SOLN
INTRAMUSCULAR | Status: DC | PRN
  Administered 2023-03-03: 2 mg via INTRAVENOUS

## 2023-03-03 MED ORDER — THROMBIN 20000 UNITS EX SOLR: CUTANEOUS | Status: DC | PRN

## 2023-03-03 MED ORDER — EPHEDRINE 5 MG/ML INJ
INTRAVENOUS | Status: AC
  Filled 2023-03-03: qty 5

## 2023-03-03 MED ORDER — ORAL CARE MOUTH RINSE: 15.0000 mL | Freq: Once | OROMUCOSAL | Status: AC

## 2023-03-03 MED ORDER — OXYCODONE HCL 5 MG PO TABS: 5.0000 mg | ORAL_TABLET | Freq: Once | ORAL | Status: DC | PRN

## 2023-03-03 MED ORDER — AMLODIPINE BESYLATE 10 MG PO TABS
10.0000 mg | ORAL_TABLET | Freq: Every morning | ORAL | Status: DC
  Filled 2023-03-03: qty 1

## 2023-03-03 MED ORDER — LOSARTAN POTASSIUM-HCTZ 100-12.5 MG PO TABS: 1.0000 | ORAL_TABLET | Freq: Every morning | ORAL | Status: DC

## 2023-03-03 MED ORDER — POLYETHYLENE GLYCOL 3350 17 G PO PACK: 17.0000 g | PACK | Freq: Every day | ORAL | Status: DC | PRN

## 2023-03-03 MED ORDER — DROPERIDOL 2.5 MG/ML IJ SOLN: 0.6250 mg | Freq: Once | INTRAMUSCULAR | Status: DC | PRN

## 2023-03-03 MED ORDER — DOCUSATE SODIUM 100 MG PO CAPS
100.0000 mg | ORAL_CAPSULE | Freq: Two times a day (BID) | ORAL | Status: DC
  Administered 2023-03-04 – 2023-03-07 (×8): 100 mg via ORAL
  Filled 2023-03-03 (×8): qty 1

## 2023-03-03 MED ORDER — ARTIFICIAL TEARS OPHTHALMIC OINT
TOPICAL_OINTMENT | OPHTHALMIC | Status: AC
Start: 2023-03-03 — End: ?
  Filled 2023-03-03: qty 3.5

## 2023-03-03 MED ORDER — CHLORHEXIDINE GLUCONATE CLOTH 2 % EX PADS
6.0000 | MEDICATED_PAD | Freq: Once | CUTANEOUS | Status: DC
Start: 2023-03-03 — End: 2023-03-03

## 2023-03-03 MED ORDER — LOSARTAN POTASSIUM 50 MG PO TABS
100.0000 mg | ORAL_TABLET | Freq: Every day | ORAL | Status: DC
  Administered 2023-03-06: 100 mg via ORAL
  Filled 2023-03-03 (×4): qty 2

## 2023-03-03 MED ORDER — CHLORHEXIDINE GLUCONATE 0.12 % MT SOLN
15.0000 mL | Freq: Once | OROMUCOSAL | Status: AC
  Administered 2023-03-03: 15 mL via OROMUCOSAL
  Filled 2023-03-03: qty 15

## 2023-03-03 MED ORDER — GLIPIZIDE ER 10 MG PO TB24: 10.0000 mg | ORAL_TABLET | Freq: Every day | ORAL | Status: DC

## 2023-03-03 MED ORDER — SUGAMMADEX SODIUM 200 MG/2ML IV SOLN
INTRAVENOUS | Status: DC | PRN
  Administered 2023-03-03: 200 mg via INTRAVENOUS

## 2023-03-03 MED ORDER — PANTOPRAZOLE SODIUM 40 MG PO TBEC
40.0000 mg | DELAYED_RELEASE_TABLET | Freq: Every day | ORAL | Status: DC
  Administered 2023-03-04 – 2023-03-07 (×4): 40 mg via ORAL
  Filled 2023-03-03 (×4): qty 1

## 2023-03-03 MED ORDER — PHENYLEPHRINE HCL (PRESSORS) 10 MG/ML IV SOLN
INTRAVENOUS | Status: AC
  Filled 2023-03-03: qty 1

## 2023-03-03 MED ORDER — POLYETHYLENE GLYCOL 3350 17 G PO PACK
17.0000 g | PACK | Freq: Every day | ORAL | Status: DC | PRN
  Administered 2023-03-05: 17 g via ORAL
  Filled 2023-03-03: qty 1

## 2023-03-03 MED ORDER — ONDANSETRON HCL 4 MG/2ML IJ SOLN
4.0000 mg | Freq: Four times a day (QID) | INTRAMUSCULAR | Status: DC | PRN
Start: 2023-03-03 — End: 2023-03-07

## 2023-03-03 MED ORDER — FENTANYL CITRATE (PF) 250 MCG/5ML IJ SOLN
INTRAMUSCULAR | Status: AC
  Filled 2023-03-03: qty 5

## 2023-03-03 MED ORDER — ATORVASTATIN CALCIUM 80 MG PO TABS
80.0000 mg | ORAL_TABLET | Freq: Every day | ORAL | Status: DC
  Administered 2023-03-03 – 2023-03-07 (×5): 80 mg via ORAL
  Filled 2023-03-03 (×6): qty 1

## 2023-03-03 MED ORDER — EPHEDRINE SULFATE-NACL 50-0.9 MG/10ML-% IV SOSY
PREFILLED_SYRINGE | INTRAVENOUS | Status: DC | PRN
  Administered 2023-03-03: 10 mg via INTRAVENOUS
  Administered 2023-03-03: 5 mg via INTRAVENOUS

## 2023-03-03 MED ORDER — ACETAMINOPHEN 650 MG RE SUPP: 650.0000 mg | RECTAL | Status: DC | PRN

## 2023-03-03 MED ORDER — FENTANYL CITRATE (PF) 100 MCG/2ML IJ SOLN: 25.0000 ug | INTRAMUSCULAR | Status: DC | PRN

## 2023-03-03 MED ORDER — HYDROCHLOROTHIAZIDE 12.5 MG PO TABS
12.5000 mg | ORAL_TABLET | Freq: Every day | ORAL | Status: DC
  Administered 2023-03-05 – 2023-03-07 (×3): 12.5 mg via ORAL
  Filled 2023-03-03 (×4): qty 1

## 2023-03-03 MED ORDER — INSULIN ASPART 100 UNIT/ML IJ SOLN: 0.0000 [IU] | INTRAMUSCULAR | Status: DC | PRN

## 2023-03-03 MED ORDER — OXYCODONE HCL 5 MG PO TABS
ORAL_TABLET | ORAL | Status: AC
  Filled 2023-03-03: qty 1

## 2023-03-03 MED ORDER — PHENYLEPHRINE HCL-NACL 20-0.9 MG/250ML-% IV SOLN
INTRAVENOUS | Status: DC | PRN
  Administered 2023-03-03: 40 ug/min via INTRAVENOUS
  Administered 2023-03-03: 160 ug via INTRAVENOUS

## 2023-03-03 MED ORDER — PROPOFOL 10 MG/ML IV BOLUS
INTRAVENOUS | Status: DC | PRN
  Administered 2023-03-03: 200 mg via INTRAVENOUS

## 2023-03-03 MED ORDER — ACETAMINOPHEN 10 MG/ML IV SOLN: 1000.0000 mg | Freq: Once | INTRAVENOUS | Status: DC | PRN

## 2023-03-03 MED ORDER — HYDROCODONE-ACETAMINOPHEN 10-325 MG PO TABS
2.0000 | ORAL_TABLET | ORAL | Status: DC | PRN
  Administered 2023-03-06: 1 via ORAL
  Filled 2023-03-03: qty 2

## 2023-03-03 MED ORDER — ACETAMINOPHEN 325 MG PO TABS: 650.0000 mg | ORAL_TABLET | ORAL | Status: DC | PRN

## 2023-03-03 MED ORDER — THROMBIN 20000 UNITS EX SOLR
CUTANEOUS | Status: AC
  Filled 2023-03-03: qty 20000

## 2023-03-03 MED ORDER — BUPIVACAINE LIPOSOME 1.3 % IJ SUSP
INTRAMUSCULAR | Status: DC | PRN
  Administered 2023-03-03: 20 mL

## 2023-03-03 MED ORDER — LIDOCAINE-EPINEPHRINE 1 %-1:100000 IJ SOLN
INTRAMUSCULAR | Status: DC | PRN
  Administered 2023-03-03: 10 mL

## 2023-03-03 MED ORDER — PHENOL 1.4 % MT LIQD: 1.0000 | OROMUCOSAL | Status: DC | PRN

## 2023-03-03 MED ORDER — INSULIN ISOPHANE & REGULAR (HUMAN 70-30)100 UNIT/ML KWIKPEN: 25.0000 [IU] | PEN_INJECTOR | SUBCUTANEOUS | Status: DC

## 2023-03-03 MED ORDER — 0.9 % SODIUM CHLORIDE (POUR BTL) OPTIME
TOPICAL | Status: DC | PRN
  Administered 2023-03-03: 1000 mL

## 2023-03-03 MED ORDER — CYCLOBENZAPRINE HCL 10 MG PO TABS
10.0000 mg | ORAL_TABLET | Freq: Three times a day (TID) | ORAL | Status: DC | PRN
  Administered 2023-03-03 – 2023-03-06 (×3): 10 mg via ORAL
  Filled 2023-03-03 (×3): qty 1

## 2023-03-03 MED ORDER — LIDOCAINE 2% (20 MG/ML) 5 ML SYRINGE
INTRAMUSCULAR | Status: DC | PRN
  Administered 2023-03-03: 100 mg via INTRAVENOUS

## 2023-03-03 MED ORDER — PANTOPRAZOLE SODIUM 40 MG IV SOLR: 40.0000 mg | Freq: Every day | INTRAVENOUS | Status: DC

## 2023-03-03 MED ORDER — SODIUM CHLORIDE 0.9% FLUSH
3.0000 mL | Freq: Two times a day (BID) | INTRAVENOUS | Status: DC
  Administered 2023-03-03: 3 mL via INTRAVENOUS

## 2023-03-03 MED ORDER — PHENYLEPHRINE 80 MCG/ML (10ML) SYRINGE FOR IV PUSH (FOR BLOOD PRESSURE SUPPORT)
PREFILLED_SYRINGE | INTRAVENOUS | Status: AC
  Filled 2023-03-03: qty 10

## 2023-03-03 MED ORDER — INSULIN ASPART PROT & ASPART (70-30 MIX) 100 UNIT/ML ~~LOC~~ SUSP
25.0000 [IU] | Freq: Every day | SUBCUTANEOUS | Status: DC
  Administered 2023-03-03 – 2023-03-05 (×2): 25 [IU] via SUBCUTANEOUS
  Filled 2023-03-03 (×2): qty 10

## 2023-03-03 MED ORDER — MAGNESIUM CITRATE PO SOLN
1.0000 | Freq: Once | ORAL | Status: AC | PRN
  Administered 2023-03-04: 1 via ORAL
  Filled 2023-03-03: qty 296

## 2023-03-03 MED ORDER — MIDAZOLAM HCL 2 MG/2ML IJ SOLN
INTRAMUSCULAR | Status: AC
  Filled 2023-03-03: qty 2

## 2023-03-03 MED ORDER — SODIUM CHLORIDE 0.9% FLUSH
3.0000 mL | INTRAVENOUS | Status: DC | PRN
Start: 2023-03-03 — End: 2023-03-07

## 2023-03-03 MED ORDER — ALUM & MAG HYDROXIDE-SIMETH 200-200-20 MG/5ML PO SUSP: 30.0000 mL | Freq: Four times a day (QID) | ORAL | Status: DC | PRN

## 2023-03-03 MED ORDER — LIDOCAINE 2% (20 MG/ML) 5 ML SYRINGE
INTRAMUSCULAR | Status: AC
Start: 2023-03-03 — End: ?
  Filled 2023-03-03: qty 5

## 2023-03-03 MED ORDER — GABAPENTIN 300 MG PO CAPS
600.0000 mg | ORAL_CAPSULE | Freq: Two times a day (BID) | ORAL | Status: DC
  Administered 2023-03-03 – 2023-03-07 (×8): 600 mg via ORAL
  Filled 2023-03-03 (×8): qty 2

## 2023-03-03 MED ORDER — BUPIVACAINE LIPOSOME 1.3 % IJ SUSP
INTRAMUSCULAR | Status: AC
  Filled 2023-03-03: qty 20

## 2023-03-03 MED ORDER — METOPROLOL SUCCINATE ER 25 MG PO TB24
25.0000 mg | ORAL_TABLET | Freq: Every day | ORAL | Status: DC
  Administered 2023-03-04 – 2023-03-07 (×4): 25 mg via ORAL
  Filled 2023-03-03 (×4): qty 1

## 2023-03-03 MED ORDER — ONDANSETRON HCL 4 MG/2ML IJ SOLN
INTRAMUSCULAR | Status: DC | PRN
  Administered 2023-03-03: 4 mg via INTRAVENOUS

## 2023-03-03 SURGICAL SUPPLY — 70 items
ADH SKN CLS APL DERMABOND .7 (GAUZE/BANDAGES/DRESSINGS) ×1
APL SKNCLS STERI-STRIP NONHPOA (GAUZE/BANDAGES/DRESSINGS) ×1
BAG COUNTER SPONGE SURGICOUNT (BAG) ×1 IMPLANT
BAG SPNG CNTER NS LX DISP (BAG) ×1
BASKET BONE COLLECTION (BASKET) ×1 IMPLANT
BENZOIN TINCTURE PRP APPL 2/3 (GAUZE/BANDAGES/DRESSINGS) ×1 IMPLANT
BLADE BONE MILL MEDIUM (MISCELLANEOUS) ×1 IMPLANT
BLADE CLIPPER SURG (BLADE) IMPLANT
BLADE SURG 11 STRL SS (BLADE) ×1 IMPLANT
BONE VIVIGEN FORMABLE 5.4CC (Bone Implant) ×1 IMPLANT
BUR CUTTER 7.0 ROUND (BURR) ×1 IMPLANT
BUR MATCHSTICK NEURO 3.0 LAGG (BURR) ×1 IMPLANT
CANISTER SUCT 3000ML PPV (MISCELLANEOUS) ×1 IMPLANT
CAP RELINE MOD TULIP RMM (Cap) IMPLANT
CNTNR URN SCR LID CUP LEK RST (MISCELLANEOUS) ×1 IMPLANT
CONT SPEC 4OZ STRL OR WHT (MISCELLANEOUS) ×1
COVER BACK TABLE 60X90IN (DRAPES) ×1 IMPLANT
DERMABOND ADVANCED .7 DNX12 (GAUZE/BANDAGES/DRESSINGS) ×1 IMPLANT
DRAPE C-ARM 42X72 X-RAY (DRAPES) ×1 IMPLANT
DRAPE C-ARMOR (DRAPES) IMPLANT
DRAPE HALF SHEET 40X57 (DRAPES) IMPLANT
DRAPE LAPAROTOMY 100X72X124 (DRAPES) ×1 IMPLANT
DRAPE SURG 17X23 STRL (DRAPES) ×1 IMPLANT
DRSG OPSITE 4X5.5 SM (GAUZE/BANDAGES/DRESSINGS) ×1 IMPLANT
DRSG OPSITE POSTOP 4X6 (GAUZE/BANDAGES/DRESSINGS) ×1 IMPLANT
DURAPREP 26ML APPLICATOR (WOUND CARE) ×1 IMPLANT
ELECT REM PT RETURN 9FT ADLT (ELECTROSURGICAL) ×1
ELECTRODE REM PT RTRN 9FT ADLT (ELECTROSURGICAL) ×1 IMPLANT
EVACUATOR 3/16 PVC DRAIN (DRAIN) ×1 IMPLANT
GAUZE 4X4 16PLY ~~LOC~~+RFID DBL (SPONGE) IMPLANT
GAUZE SPONGE 4X4 12PLY STRL (GAUZE/BANDAGES/DRESSINGS) ×1 IMPLANT
GLOVE BIO SURGEON STRL SZ7 (GLOVE) IMPLANT
GLOVE BIO SURGEON STRL SZ8 (GLOVE) ×2 IMPLANT
GLOVE BIOGEL PI IND STRL 7.0 (GLOVE) IMPLANT
GLOVE EXAM NITRILE XL STR (GLOVE) IMPLANT
GLOVE INDICATOR 8.5 STRL (GLOVE) ×2 IMPLANT
GOWN STRL REUS W/ TWL LRG LVL3 (GOWN DISPOSABLE) IMPLANT
GOWN STRL REUS W/ TWL XL LVL3 (GOWN DISPOSABLE) ×2 IMPLANT
GOWN STRL REUS W/TWL 2XL LVL3 (GOWN DISPOSABLE) IMPLANT
GOWN STRL REUS W/TWL LRG LVL3 (GOWN DISPOSABLE)
GOWN STRL REUS W/TWL XL LVL3 (GOWN DISPOSABLE) ×2
GRAFT BNE MATRIX VG FRMBL MD 5 (Bone Implant) IMPLANT
KIT BASIN OR (CUSTOM PROCEDURE TRAY) ×1 IMPLANT
KIT GRAFTMAG DEL NEURO DISP (NEUROSURGERY SUPPLIES) IMPLANT
KIT TURNOVER KIT B (KITS) ×1 IMPLANT
MILL BONE PREP (MISCELLANEOUS) ×1 IMPLANT
NDL HYPO 21X1.5 SAFETY (NEEDLE) ×1 IMPLANT
NDL HYPO 25X1 1.5 SAFETY (NEEDLE) ×1 IMPLANT
NEEDLE HYPO 21X1.5 SAFETY (NEEDLE) ×1 IMPLANT
NEEDLE HYPO 25X1 1.5 SAFETY (NEEDLE) ×1 IMPLANT
NS IRRIG 1000ML POUR BTL (IV SOLUTION) ×1 IMPLANT
PACK LAMINECTOMY NEURO (CUSTOM PROCEDURE TRAY) ×1 IMPLANT
PAD ARMBOARD 7.5X6 YLW CONV (MISCELLANEOUS) ×3 IMPLANT
ROD RELINE COCR LORD 5.0X70MM (Rod) IMPLANT
SCREW LOCK RSS 4.5/5.0MM (Screw) IMPLANT
SCREW SHANK RELINE MOD 5.0X35 (Screw) IMPLANT
SPACER ALTERA 10X26 9-13MM (Spacer) IMPLANT
SPIKE FLUID TRANSFER (MISCELLANEOUS) ×1 IMPLANT
SPONGE SURGIFOAM ABS GEL 100 (HEMOSTASIS) ×1 IMPLANT
SPONGE T-LAP 4X18 ~~LOC~~+RFID (SPONGE) IMPLANT
STRIP CLOSURE SKIN 1/2X4 (GAUZE/BANDAGES/DRESSINGS) ×2 IMPLANT
SUT VIC AB 0 CT1 18XCR BRD8 (SUTURE) ×1 IMPLANT
SUT VIC AB 0 CT1 8-18 (SUTURE) ×1
SUT VIC AB 2-0 CT1 18 (SUTURE) ×1 IMPLANT
SUT VIC AB 4-0 PS2 27 (SUTURE) ×1 IMPLANT
SYR 20ML LL LF (SYRINGE) ×1 IMPLANT
TOWEL GREEN STERILE (TOWEL DISPOSABLE) ×1 IMPLANT
TOWEL GREEN STERILE FF (TOWEL DISPOSABLE) ×1 IMPLANT
TRAY FOLEY MTR SLVR 16FR STAT (SET/KITS/TRAYS/PACK) ×1 IMPLANT
WATER STERILE IRR 1000ML POUR (IV SOLUTION) ×1 IMPLANT

## 2023-03-03 NOTE — Op Note (Signed)
Preoperative diagnosis: Lumbar spinal stenosis recurrent herniated exposes L3-4 L4-5 with mechanical back pain and instability.  Postop diagnosis: Same.  Procedure: #1 redo decompressive laminectomies L3-4 L4-5 with complete facetectomies and radical foraminotomies of the L3 and L4 and L5 nerve roots bilaterally  2.  Transforaminal lumbar interbody fusions utilizing globus Altera transforaminal cage packed with locally harvested autograft mixed with Vivigen L3-4 L4-5.  3.  Cortical screw fixation L3-4 L4-5 utilizing the NuVasive modular cortical screw set.  4.  Posterolateral arthrodesis L3-4 L4-5 utilizing locally harvested autograft mixed with Vivigen.  Surgeon: Donalee Citrin.  Assistant: Julien Girt.  Anesthesia: General.  EBL: 250.  HPI: 52 year old female longstanding back back and bilateral hip and leg pain underwent a left-sided laminotomy at 3 4 and 4 5 beginning of the year but then experienced progressive worsening back pain bilateral hip and leg pain developed bilateral foot drops workup revealed severe spinal stenosis with degenerative collapse and marked facet arthropathy at L3-4 and L4-5 with recurrent disc herniations.  And due to her progression of clinical syndrome imaging findings of a conservative treatment I recommended redo decompressive laminectomies at those 2 levels with interbody fusions.  I extensively reviewed the risks and benefits of the operation with the patient as well as perioperative course expectations of outcome and alternatives to surgery and she understood and agreed to proceed forward.  Operative procedure: Patient was brought into the OR was induced under general anesthesia positioned prone the Wilson frame her back was prepped and draped in routine sterile fashion.  Roll incision was opened up and extended slightly caudally and after infiltration of 10 cc of lidocaine with epi midline incision was made and Bovie electrocautery was used take down the scar  tissue in the subcutaneous tissue and subperiosteal dissection carried lamina at L3, L4, L5 dissected necrotic tissue of right way exposing the facet joints at those levels.  Interoperative x-ray confirmed application of the appropriate level.  Send the spinous process at L3 and L4 were removed central decompression was begun.  Scar tissue was freed up and working first on the left side free of all the scar tissue and performed complete medial facetectomies and foraminotomies of the L3-L4 and L5 nerve roots on the left there was marked hourglass compression of thecal sac there was fracturing of the facet joint and displacement medially causing severe stenosis primarily on the L4 nerve root this was all freed up and removed then in a similar fashion on the right side complete facetectomies were performed and radical foraminotomies of the 3 4 and 5 root on that side.  After the decompression there was no further stenosis wide foraminotomies wide central decompression the disc base was then incised on both sides with a distractor on the right side at L3-4 I prepared the endplates performed a complete discectomy packed and extensive mount of autograft mix anteriorly and laterally and inserted the transforaminal cage under fluoroscopy.  This similar fashion at L4-5 deploying the cage from the patient's right side.  After adequate cage position and been confirmed and all the recurrent disc and then removed especially at L4-5 then the cortical screws were placed all screws with excellent purchase I then aggressively decorticated the facet joints and lateral TPs after the wound was copiously irrigated packed and extensive mount of autograft mix posterior laterally at 3 4 and 4 5 along the lateral facets and TPs.  Then I reinspected the foramina to confirm patency and no migration of graft material but Gelfoam and top  of the dura assembled the rods and tightened everything down compressed L3 against L4 and then anchored  everything in place.  I then placed a medium Hemovac drain injected Exparel in the fascia and closed the wound in layers with interrupted Vicryl and a running 4 subcuticular.  Dermabond benzoin Steri-Strips and a sterile dressing was applied patient recovery in stable condition.  At the end the case all needle count sponge counts were correct.

## 2023-03-03 NOTE — Anesthesia Preprocedure Evaluation (Addendum)
Anesthesia Evaluation  Patient identified by MRN, date of birth, ID band Patient awake    Reviewed: Allergy & Precautions, H&P , NPO status , Patient's Chart, lab work & pertinent test results  Airway Mallampati: II  TM Distance: >3 FB Neck ROM: Full    Dental no notable dental hx.    Pulmonary neg pulmonary ROS   Pulmonary exam normal breath sounds clear to auscultation       Cardiovascular hypertension, Normal cardiovascular exam Rhythm:Regular Rate:Normal     Neuro/Psych negative neurological ROS  negative psych ROS   GI/Hepatic Neg liver ROS,GERD  ,,  Endo/Other  diabetes, Type 2    Renal/GU negative Renal ROS  negative genitourinary   Musculoskeletal  (+) Arthritis ,    Abdominal   Peds negative pediatric ROS (+)  Hematology  (+) Blood dyscrasia, anemia   Anesthesia Other Findings   Reproductive/Obstetrics negative OB ROS                             Anesthesia Physical Anesthesia Plan  ASA: 2  Anesthesia Plan: General   Post-op Pain Management:    Induction: Intravenous  PONV Risk Score and Plan: Ondansetron and Dexamethasone  Airway Management Planned: Oral ETT  Additional Equipment:   Intra-op Plan:   Post-operative Plan: Extubation in OR  Informed Consent: I have reviewed the patients History and Physical, chart, labs and discussed the procedure including the risks, benefits and alternatives for the proposed anesthesia with the patient or authorized representative who has indicated his/her understanding and acceptance.     Dental advisory given  Plan Discussed with: CRNA and Anesthesiologist  Anesthesia Plan Comments:         Anesthesia Quick Evaluation

## 2023-03-03 NOTE — Transfer of Care (Signed)
Immediate Anesthesia Transfer of Care Note  Patient: Sayaka Hoeppner  Procedure(s) Performed: Lumbar Three-Four, Lumbar Four-Five Posterior Lateral and Interbody Fusion (Spine Lumbar)  Patient Location: PACU  Anesthesia Type:General  Level of Consciousness: oriented, drowsy, and patient cooperative  Airway & Oxygen Therapy: Patient Spontanous Breathing and Patient connected to nasal cannula oxygen  Post-op Assessment: Report given to RN and Post -op Vital signs reviewed and stable  Post vital signs: Reviewed and stable  Last Vitals:  Vitals Value Taken Time  BP 120/80 03/03/23 1704  Temp    Pulse 94 03/03/23 1711  Resp 10 03/03/23 1711  SpO2 99 % 03/03/23 1711  Vitals shown include unfiled device data.  Last Pain:  Vitals:   03/03/23 0958  TempSrc:   PainSc: 0-No pain         Complications: No notable events documented.

## 2023-03-03 NOTE — Anesthesia Procedure Notes (Signed)
Procedure Name: Intubation Date/Time: 03/03/2023 12:55 PM  Performed by: Hilda Lias, CRNAPre-anesthesia Checklist: Patient identified, Emergency Drugs available, Suction available and Patient being monitored Patient Re-evaluated:Patient Re-evaluated prior to induction Oxygen Delivery Method: Circle System Utilized Preoxygenation: Pre-oxygenation with 100% oxygen Induction Type: IV induction Ventilation: Mask ventilation without difficulty Laryngoscope Size: Mac and 3 Grade View: Grade I Tube type: Oral Tube size: 8.0 mm Number of attempts: 1 Airway Equipment and Method: Stylet and Oral airway Placement Confirmation: ETT inserted through vocal cords under direct vision, positive ETCO2 and breath sounds checked- equal and bilateral Secured at: 22 cm Tube secured with: Tape Dental Injury: Teeth and Oropharynx as per pre-operative assessment

## 2023-03-03 NOTE — H&P (Addendum)
Jill Holt is an 52 y.o. female.   Chief Complaint: Back bilateral hip and leg pain HPI: 52 year old female previous undergone laminotomies on the left at L3-4 and L4-5 initially get better from some radicular pain but then started experiencing progressive worsening back and bilateral hip and leg pain.  Workup revealed progression of degenerative disc disease and spinal stenosis at L3-4 and L4-5.  And due to her predominance of back pain progression of clinical syndrome imaging findings previous surgery I recommended decompressive laminectomy and interbody fusions at L3-4 and L4-5.  I extensively reviewed the risks and benefits of the operation with her as well as perioperative course expectations of outcome and alternatives to surgery and she understood and agreed to proceed forward.  Past Medical History:  Diagnosis Date   Anemia    Iron Deficiency Anemia   Arthritis    COVID 09/2018   had pneumonia   Diabetes mellitus without complication (HCC)    GERD (gastroesophageal reflux disease)    Hypertension    Pneumonia     Past Surgical History:  Procedure Laterality Date   CARPAL TUNNEL RELEASE Bilateral    FEMUR FRACTURE SURGERY Left    ganglion cyst removal Right    Hand   LUMBAR LAMINECTOMY/DECOMPRESSION MICRODISCECTOMY Left 07/24/2022   Procedure: Sublaminar decompression - left - Lumbar Three-Lumbar Four - Lumbar Four-Lumbar Five;  Surgeon: Donalee Citrin, MD;  Location: [facility] OR;  Service: Neurosurgery;  Laterality: Left;  3C   TUBAL LIGATION     1990s    History reviewed. No pertinent family history. Social History:  reports that she has never smoked. She has never been exposed to tobacco smoke. She has never used smokeless tobacco. She reports current alcohol use. She reports that she does not use drugs.  Allergies:  Allergies  Allergen Reactions   Lisinopril Cough    Medications Prior to Admission  Medication Sig Dispense Refill   amLODipine (NORVASC) 10 MG tablet Take 10  mg by mouth in the morning.     atorvastatin (LIPITOR) 80 MG tablet Take 1 tablet by mouth daily.     cyclobenzaprine (FLEXERIL) 10 MG tablet Take 1 tablet (10 mg total) by mouth 3 (three) times daily as needed for muscle spasms. 30 tablet 0   gabapentin (NEURONTIN) 300 MG capsule Take 600 mg by mouth 2 (two) times daily.     glipiZIDE (GLUCOTROL XL) 10 MG 24 hr tablet Take 1 tablet by mouth daily.     insulin isophane & regular human KwikPen (HUMULIN 70/30 KWIKPEN) (70-30) 100 UNIT/ML KwikPen Inject 25-35 Units into the skin See admin instructions. Inject 35 units subcutaneously in the morning & inject 25 units subcutaneously at night     losartan-hydrochlorothiazide (HYZAAR) 100-12.5 MG tablet Take 1 tablet by mouth in the morning.     metoprolol succinate (TOPROL-XL) 25 MG 24 hr tablet Take 25 mg by mouth daily.     omeprazole (PRILOSEC) 40 MG capsule Take 40 mg by mouth daily before breakfast.     HYDROcodone-acetaminophen (NORCO/VICODIN) 5-325 MG tablet Take 1 tablet by mouth every 4 (four) hours as needed for severe pain ((score 7 to 10)). (Patient not taking: Reported on 02/18/2023) 30 tablet 0   ondansetron (ZOFRAN-ODT) 4 MG disintegrating tablet 4mg  ODT q4 hours prn nausea/vomit 20 tablet 0   polyethylene glycol (MIRALAX) 17 g packet Take 17 g by mouth daily. Take additional doses initially for bowel cleanout. (Patient taking differently: Take 17 g by mouth daily as needed for moderate  constipation.) 30 each 0    Results for orders placed or performed during the hospital encounter of 03/03/23 (from the past 48 hour(s))  Glucose, capillary     Status: Abnormal   Collection Time: 03/03/23  9:45 AM  Result Value Ref Range   Glucose-Capillary 107 (H) 70 - 99 mg/dL    Comment: Glucose reference range applies only to samples taken after fasting for at least 8 hours.   Comment 1 Notify RN   ABO/Rh     Status: None   Collection Time: 03/03/23  9:56 AM  Result Value Ref Range   ABO/RH(D)       B POS Performed at [facility], [address], [place], [place] [address]   Pregnancy, urine POC     Status: None   Collection Time: 03/03/23 10:06 AM  Result Value Ref Range   Preg Test, Ur NEGATIVE NEGATIVE    Comment:        THE SENSITIVITY OF THIS METHODOLOGY IS >24 mIU/mL    No results found.  Review of Systems  Musculoskeletal:  Positive for back pain.  Neurological:  Positive for numbness.    Blood pressure (!) 160/91, pulse 95, temperature 97.7 F (36.5 C), temperature source Oral, resp. rate 18, height 5\' 4"  (1.626 m), weight 88.5 kg, last menstrual period 05/31/2022, SpO2 100%. Physical Exam HENT:     Head: Normocephalic.     Right Ear: Tympanic membrane normal.     Nose: Nose normal.  Eyes:     Pupils: Pupils are equal, round, and reactive to light.  Cardiovascular:     Rate and Rhythm: Normal rate.  Pulmonary:     Effort: Pulmonary effort is normal.  Abdominal:     General: Abdomen is flat.  Musculoskeletal:        General: Normal range of motion.     Cervical back: Normal range of motion.  Skin:    General: Skin is warm.  Neurological:     General: No focal deficit present.     Mental Status: She is alert.     Comments: Patient has bilateral foot drops 1-2 on the left 2-3 on the right the left sides been chronic for her since before the last operation in February and March the right side has been getting worse over the last several weeks.  Right leg gave away and she fell earlier today watch her legs are 5-5 strength      Assessment/Plan 52 year old presents for redo decompressive laminectomies L3-4 L4-5 and transforaminal lumbar interbody fusions at those levels.  Mariam Dollar, MD 03/03/2023, 11:49 AM

## 2023-03-04 ENCOUNTER — Encounter (HOSPITAL_COMMUNITY): Payer: Self-pay | Admitting: Neurosurgery

## 2023-03-04 LAB — GLUCOSE, CAPILLARY
Glucose-Capillary: 144 mg/dL — ABNORMAL HIGH (ref 70–99)
Glucose-Capillary: 194 mg/dL — ABNORMAL HIGH (ref 70–99)
Glucose-Capillary: 160 mg/dL — ABNORMAL HIGH (ref 70–99)
Glucose-Capillary: 89 mg/dL (ref 70–99)
Glucose-Capillary: 126 mg/dL — ABNORMAL HIGH (ref 70–99)

## 2023-03-04 MED ORDER — GLIPIZIDE ER 10 MG PO TB24
10.0000 mg | ORAL_TABLET | Freq: Every day | ORAL | Status: DC
  Administered 2023-03-05 – 2023-03-07 (×3): 10 mg via ORAL
  Filled 2023-03-04 (×3): qty 1

## 2023-03-04 NOTE — TOC Initial Note (Addendum)
Transition of Care [facility]) - Initial/Assessment Note    Patient Details  Name: Jill Holt MRN: [id number] Date of Birth: Aug 12, 1970  Transition of Care [facility]) CM/SW Contact:    Kermit Balo, RN Phone Number: 03/04/2023, 10:55 AM  Clinical Narrative:                  Pt is s/p Lumbar Three-Four, Lumbar Four-Five Posterior Lateral and Interbody Fusion.  She lives at home with her daughter but her daughter is going back to work and has a new Development worker, international aid.  Pt is not able to enter and exit the home safely due to steps and feels she would fair better with some rehab. PT/OT in agreement.  Pt prefers: [facility] in [place] but willing to be faxed out to other facilities in [place] area. CM has faxed her information to [facility] after confirming they are in network with her insurance. Awaiting bed offers and then will need insurance approval.  TOC following.  1426: CM left voicemail for [facility] admissions to see if able to offer a bed.    Expected Discharge Plan: Skilled Nursing Facility Barriers to Discharge: Continued Medical Work up   Patient Goals and CMS Choice   CMS Medicare.gov Compare Post Acute Care list provided to:: Patient Choice offered to / list presented to : Patient      Expected Discharge Plan and Services In-house Referral: Clinical Social Work Discharge Planning Services: CM Consult Post Acute Care Choice: Skilled Nursing Facility Living arrangements for the past 2 months: Apartment                                      Prior Living Arrangements/Services Living arrangements for the past 2 months: Apartment Lives with:: Adult Children Patient language and need for interpreter reviewed:: Yes Do you feel safe going back to the place where you live?: Yes        Care giver support system in place?: No (comment)   Criminal Activity/Legal Involvement Pertinent to Current Situation/Hospitalization: No - Comment as needed  Activities of Daily  Living      Permission Sought/Granted                  Emotional Assessment Appearance:: Appears stated age Attitude/Demeanor/Rapport: Engaged Affect (typically observed): Accepting Orientation: : Oriented to Self, Oriented to Place, Oriented to  Time, Oriented to Situation   Psych Involvement: No (comment)  Admission diagnosis:  Spinal stenosis of lumbar region [M48.061] Patient Active Problem List   Diagnosis Date Noted   Spinal stenosis of lumbar region 07/24/2022   PCP:  Bailey Mech, PA-C Pharmacy:   [id number] [place], [place] - [address] AT [facility] [address] [place] [address] Phone: [phone] Fax: [phone]     Social Determinants of Health (SDOH) Social History: SDOH Screenings   Tobacco Use: Low Risk  (03/03/2023)   SDOH Interventions:     Readmission Risk Interventions     No data to display

## 2023-03-04 NOTE — Evaluation (Signed)
Occupational Therapy Evaluation Patient Details Name: Jill Holt MRN: [id number] DOB: 11-Aug-1970 Today's Date: 03/04/2023   History of Present Illness Pt is a 52 year old female who underwent a decompressive laminectomy and interbody fusions at L3-4 and L4-5 on 11/4. PMH/PSH:  previous laminotomies on the left at L3-4 and L4-5, DM, HTN, anemia   Clinical Impression   Jill Holt was evaluated s/p the above admission list. She has had recent falls and uses DME at baseline. Upon evaluation the pt was limited by pain, low BP, poor activity tolerance and spinal precautions. Overall she needed mod A for bed mobility and min A to stand with the RW. Pt limited in mobility due to report of dizziness and pain. Due to the deficits listed below the pt also needs up to mod A for LB ADLs and set up for UB ADLs in sitting. Pt will benefit from continued acute OT services and skilled inpatient follow up therapy, <3 hours/day.         If plan is discharge home, recommend the following: A little help with walking and/or transfers;A lot of help with bathing/dressing/bathroom;Direct supervision/assist for medications management;Assistance with cooking/housework;Direct supervision/assist for financial management;Assist for transportation;Help with stairs or ramp for entrance    Functional Status Assessment  Patient has had a recent decline in their functional status and demonstrates the ability to make significant improvements in function in a reasonable and predictable amount of time.  Equipment Recommendations  None recommended by OT       Precautions / Restrictions Precautions Precautions: Back Precaution Booklet Issued: Yes (comment) Precaution Comments: pt educated but unable to recall, also watch BP Required Braces or Orthoses:  (no order for brace however pt has a brace) Restrictions Weight Bearing Restrictions: No      Mobility Bed Mobility Overal bed mobility: Needs Assistance Bed Mobility:  Sit to Sidelying, Rolling Rolling: Contact guard assist       Sit to sidelying: Mod assist      Transfers Overall transfer level: Needs assistance Equipment used: Rolling walker (2 wheels) Transfers: Sit to/from Stand Sit to Stand: Min assist                  Balance Overall balance assessment: Needs assistance Sitting-balance support: Feet supported, Single extremity supported Sitting balance-Leahy Scale: Fair     Standing balance support: Bilateral upper extremity supported, Reliant on assistive device for balance, During functional activity Standing balance-Leahy Scale: Poor                             ADL either performed or assessed with clinical judgement   ADL Overall ADL's : Needs assistance/impaired Eating/Feeding: Independent;Sitting   Grooming: Set up;Sitting   Upper Body Bathing: Set up;Sitting   Lower Body Bathing: Moderate assistance;Sit to/from stand   Upper Body Dressing : Set up;Sitting   Lower Body Dressing: Moderate assistance;Sit to/from stand Lower Body Dressing Details (indicate cue type and reason): pt not attempting figure four despite cues Toilet Transfer: Ambulation;Rolling walker (2 wheels);Minimal assistance   Toileting- Clothing Manipulation and Hygiene: Supervision/safety;Sitting/lateral lean       Functional mobility during ADLs: Minimal assistance;Rolling walker (2 wheels) General ADL Comments: needed maximal verbal cues     Vision Baseline Vision/History: 0 No visual deficits Vision Assessment?: No apparent visual deficits     Perception Perception: Within Functional Limits       Praxis Praxis: WFL       Pertinent Vitals/Pain Pain Assessment  Pain Assessment: 0-10 Pain Score: 6  Pain Location: back Pain Descriptors / Indicators: Sharp Pain Intervention(s): Monitored during session     Extremity/Trunk Assessment Upper Extremity Assessment Upper Extremity Assessment: Overall WFL for tasks  assessed   Lower Extremity Assessment Lower Extremity Assessment: Defer to PT evaluation RLE Deficits / Details: minimal active DF, LAQ less than 1/2 the range RLE Sensation:  (numbness in foot) LLE Deficits / Details: minimal active DF otherwise grossly 3+/5 LLE Sensation:  (numbness in foot)   Cervical / Trunk Assessment Cervical / Trunk Assessment: Back Surgery   Communication Communication Communication: No apparent difficulties   Cognition Arousal: Alert Behavior During Therapy: Anxious Overall Cognitive Status: No family/caregiver present to determine baseline cognitive functioning                                 General Comments: self-distracted throughout, needed constant cues for redirection to task. recalled 1/3 back precautions. perseverating on limited assist at home.     General Comments  VSS            Home Living Family/patient expects to be discharged to:: Private residence Living Arrangements: Children Available Help at Discharge: Family;Available PRN/intermittently Type of Home: Other(Comment) Home Access: Stairs to enter Entrance Stairs-Number of Steps: 3 Entrance Stairs-Rails: Left Home Layout: One level     Bathroom Shower/Tub: Chief Strategy Officer: Standard Bathroom Accessibility: Yes   Home Equipment: Agricultural consultant (2 wheels);Rollator (4 wheels);BSC/3in1   Additional Comments: pt had bed taken off bed frame to sit lower; her dtr is currently on maternity leave      Prior Functioning/Environment Prior Level of Function : Needs assist             Mobility Comments: pt reports using a RW since march however had a fall and began using a rollator however the brakes don't work per pt report. Pt very dependent on UEs to get up the stairs (pt showed PT a video from her blink camera on how to goes up the stairs" ADLs Comments: doesn't get in the tub, was able to dress/bath self        OT Problem List: Decreased  strength;Decreased range of motion;Decreased activity tolerance;Impaired balance (sitting and/or standing);Decreased safety awareness;Decreased knowledge of use of DME or AE;Decreased knowledge of precautions;Pain      OT Treatment/Interventions: Self-care/ADL training;Therapeutic exercise;DME and/or AE instruction;Therapeutic activities;Balance training;Patient/family education    OT Goals(Current goals can be found in the care plan section) Acute Rehab OT Goals Patient Stated Goal: less pain OT Goal Formulation: With patient Time For Goal Achievement: 03/18/23 Potential to Achieve Goals: Good ADL Goals Pt Will Perform Grooming: with supervision;standing Pt Will Perform Lower Body Dressing: with supervision;sit to/from stand Pt Will Transfer to Toilet: with supervision;ambulating Additional ADL Goal #1: Pt will tolerate at least 8 minutes of OOB activity to demonstrate improved tolerance for safe discharge home  OT Frequency: Min 1X/week       AM-PAC OT "6 Clicks" Daily Activity     Outcome Measure Help from another person eating meals?: None Help from another person taking care of personal grooming?: A Little Help from another person toileting, which includes using toliet, bedpan, or urinal?: A Little Help from another person bathing (including washing, rinsing, drying)?: A Lot Help from another person to put on and taking off regular upper body clothing?: A Little Help from another person to put on and taking  off regular lower body clothing?: A Lot 6 Click Score: 17   End of Session Equipment Utilized During Treatment: Rolling walker (2 wheels) Nurse Communication: Mobility status  Activity Tolerance: Patient tolerated treatment well Patient left: in bed;with call bell/phone within reach  OT Visit Diagnosis: Unsteadiness on feet (R26.81);Other abnormalities of gait and mobility (R26.89);Muscle weakness (generalized) (M62.81)                Time: 0981-1914 OT Time Calculation  (min): 18 min Charges:  OT General Charges $OT Visit: 1 Visit OT Evaluation $OT Eval Moderate Complexity: 1 Mod  Derenda Mis, OTR/L Acute Rehabilitation Services Office [phone] Secure Chat Communication Preferred   Donia Pounds 03/04/2023, 11:03 AM

## 2023-03-04 NOTE — Evaluation (Signed)
Physical Therapy Evaluation Patient Details Name: Colena Ketterman MRN: [id number] DOB: Mar 09, 1971 Today's Date: 03/04/2023  History of Present Illness  Pt is a 52 year old female who underwent a decompressive laminectomy and interbody fusions at L3-4 and L4-5 on 11/4. PMH/PSH:  previous laminotomies on the left at L3-4 and L4-5, DM, HTN, anemia   Clinical Impression  Pt admitted for above surgery. Pt presenting with bilat LE weakness including bilat drop foot with R worse than L. R LE weaker than L LE overall as well. Pt very dependent on UE when ambulating due to bilat LE instability, numbness in feet, and bilat foot drop. Pt also with onset of dizziness/seeing spots during ambulation, BP 97/67. Pt unsafe to return home alone as pt at increased falls risk and requiring minA for safe transfers and ambulation. Pt also reports "I can't get up my stairs because I have to pull up on the railing and swing my leg up so I need transport to take me home." Unsure of what home care services are available to patient and daughter can not care for her as she just had a baby and will be returning to work on 11/10. At this time recommending inpatient rehab program < 3 hrs/day to allow for increased time to be able to safely negotiate stairs to enter home and achieve safe mod I level of function. Acute PT to cont to follow.        If plan is discharge home, recommend the following: A lot of help with walking and/or transfers;A lot of help with bathing/dressing/bathroom;Assist for transportation;Help with stairs or ramp for entrance   Can travel by private vehicle   No    Equipment Recommendations None recommended by PT (has RW and 3n1 commode, would benefit from a reacher)  Recommendations for Other Services       Functional Status Assessment Patient has had a recent decline in their functional status and/or demonstrates limited ability to make significant improvements in function in a reasonable and  predictable amount of time     Precautions / Restrictions Precautions Precautions: Back Precaution Booklet Issued: Yes (comment) Precaution Comments: pt educated but unable to recall, also watch BP Required Braces or Orthoses:  (no order for brace however pt has a brace) Restrictions Weight Bearing Restrictions: No      Mobility  Bed Mobility Overal bed mobility: Needs Assistance Bed Mobility: Rolling, Sidelying to Sit Rolling: Min assist Sidelying to sit: Mod assist       General bed mobility comments: increased time, verbal cues to use edge of mattress vs bed rail to mimic home, minA to achieve sidelying, modA for trunk elevation to achieve sitting EOB    Transfers Overall transfer level: Needs assistance Equipment used: Rolling walker (2 wheels) Transfers: Sit to/from Stand Sit to Stand: Min assist           General transfer comment: minA to power up, bed height raised, labored effort    Ambulation/Gait Ambulation/Gait assistance: Contact guard assist Gait Distance (Feet): 80 Feet Assistive device: Rolling walker (2 wheels) Gait Pattern/deviations: Step-to pattern, Decreased dorsiflexion - right, Decreased dorsiflexion - left, Decreased stride length, Trunk flexed Gait velocity: dec Gait velocity interpretation: <1.8 ft/sec, indicate of risk for recurrent falls   General Gait Details: pt very dependent on UEs, pt with increased difficulty clearing R foot compared to L however both LEs with decreased active DF. pt with onset of dizzienss/"seeing spots'. Pt returned to sitting EOB and BP taken at 97/67, RN present and  aware, frequent standing rest break  Stairs            Wheelchair Mobility     Tilt Bed    Modified Rankin (Stroke Patients Only)       Balance Overall balance assessment: Needs assistance Sitting-balance support: Feet supported, Single extremity supported Sitting balance-Leahy Scale: Fair     Standing balance support: Bilateral  upper extremity supported, Reliant on assistive device for balance, During functional activity Standing balance-Leahy Scale: Poor                               Pertinent Vitals/Pain Pain Assessment Pain Assessment: 0-10 Pain Score: 6  Pain Location: back Pain Descriptors / Indicators: Sharp Pain Intervention(s): Monitored during session    Home Living Family/patient expects to be discharged to:: Private residence Living Arrangements: Children Available Help at Discharge: Family;Available PRN/intermittently Type of Home: Other(Comment) (condo) Home Access: Stairs to enter Entrance Stairs-Rails: Left Entrance Stairs-Number of Steps: 3   Home Layout: One level Home Equipment: Agricultural consultant (2 wheels);Rollator (4 wheels);BSC/3in1 Additional Comments: pt had bed taken off bed frame to sit lower    Prior Function Prior Level of Function : Needs assist             Mobility Comments: pt reports using a RW since march however had a fall and began using a rollator however the brakes don't work per pt report. Pt very dependent on UEs to get up the stairs (pt showed PT a video from her blink camera on how to goes up the stairs" ADLs Comments: doesn't get in the tub, was able to dress/bath self     Extremity/Trunk Assessment   Upper Extremity Assessment Upper Extremity Assessment: Overall WFL for tasks assessed    Lower Extremity Assessment Lower Extremity Assessment: RLE deficits/detail;LLE deficits/detail RLE Deficits / Details: minimal active DF, LAQ less than 1/2 the range RLE Sensation:  (numbness in foot) LLE Deficits / Details: minimal active DF otherwise grossly 3+/5 LLE Sensation:  (numbness in foot)    Cervical / Trunk Assessment Cervical / Trunk Assessment: Back Surgery  Communication   Communication Communication: No apparent difficulties  Cognition Arousal: Alert Behavior During Therapy: Anxious Overall Cognitive Status: No family/caregiver  present to determine baseline cognitive functioning                                 General Comments: pt mildly confused on the date stating "I fell this morning" referring to yesterday morning before surgery however required significant explanation on todays date and sequence of events. Pt able to follow one step commands. pt focused on getting [facility] services via medicaid upon d/c home        General Comments General comments (skin integrity, edema, etc.): BP soft 97/67 and symptomatic during ambulation    Exercises     Assessment/Plan    PT Assessment Patient needs continued PT services  PT Problem List Decreased activity tolerance;Decreased strength;Decreased range of motion;Decreased balance;Decreased mobility;Decreased coordination;Decreased knowledge of use of DME;Pain       PT Treatment Interventions DME instruction;Gait training;Stair training;Functional mobility training;Therapeutic activities;Therapeutic exercise;Balance training;Neuromuscular re-education    PT Goals (Current goals can be found in the Care Plan section)  Acute Rehab PT Goals Patient Stated Goal: get better PT Goal Formulation: With patient Time For Goal Achievement: 03/18/23 Potential to Achieve Goals: Good  Frequency Min 5X/week     Co-evaluation               AM-PAC PT "6 Clicks" Mobility  Outcome Measure Help needed turning from your back to your side while in a flat bed without using bedrails?: A Little Help needed moving from lying on your back to sitting on the side of a flat bed without using bedrails?: A Little Help needed moving to and from a bed to a chair (including a wheelchair)?: A Little Help needed standing up from a chair using your arms (e.g., wheelchair or bedside chair)?: A Little Help needed to walk in hospital room?: A Little Help needed climbing 3-5 steps with a railing? : Total 6 Click Score: 16    End of Session Equipment Utilized During Treatment:  Gait belt;Back brace Activity Tolerance: Patient limited by pain;Other (comment) (onset of dizziness /seeing spots) Patient left: in bed;with call bell/phone within reach (sitting EOB) Nurse Communication: Mobility status (soft BP and d/c requests of HH services) PT Visit Diagnosis: Unsteadiness on feet (R26.81);Muscle weakness (generalized) (M62.81);Ataxic gait (R26.0)    Time: 7829-5621 PT Time Calculation (min) (ACUTE ONLY): 40 min   Charges:   PT Evaluation $PT Eval Moderate Complexity: 1 Mod PT Treatments $Gait Training: 8-22 mins $Therapeutic Activity: 8-22 mins PT General Charges $$ ACUTE PT VISIT: 1 Visit         Lewis Shock, PT, DPT Acute Rehabilitation Services Secure chat preferred Office #: [phone]   Iona Hansen 03/04/2023, 10:01 AM

## 2023-03-04 NOTE — Progress Notes (Signed)
Subjective: Patient reports  patient is admitted to hospital underwent decompressive laminectomy and interbody fusions postoperatively is doing very well significant proven preoperative radicular pain  Objective: Vital signs in last 24 hours: Temp:  [97 F (36.1 C)-98.4 F (36.9 C)] 98.3 F (36.8 C) (11/05 0533) Pulse Rate:  [86-95] 90 (11/05 0533) Resp:  [9-20] 20 (11/05 0533) BP: (94-160)/(65-91) 113/70 (11/05 0533) SpO2:  [95 %-100 %] 95 % (11/05 0533) Weight:  [88.5 kg] 88.5 kg (11/04 0946)  Intake/Output from previous day: 11/04 0701 - 11/05 0700 In: 820 [P.O.:720; IV Piggyback:100] Out: 900 [Urine:475; Drains:175; Blood:250] Intake/Output this shift: No intake/output data recorded.  Strength remains with bilateral foot drops worse on the left  Lab Results: No results for input(s): "WBC", "HGB", "HCT", "PLT" in the last 72 hours. BMET No results for input(s): "NA", "K", "CL", "CO2", "GLUCOSE", "BUN", "CREATININE", "CALCIUM" in the last 72 hours.  Studies/Results: DG Lumbar Spine 2-3 Views  Result Date: 03/03/2023 CLINICAL DATA:  Elective surgery. EXAM: LUMBAR SPINE - 2-3 VIEW COMPARISON:  None Available. FINDINGS: Two fluoroscopic spot views of the lumbar spine obtained in the operating room. Pedicle screws are present at L3, L4, and L5 with interbody spacers in place. Fluoroscopy time 1 minutes 1 seconds. Dose 40.90 mGy. IMPRESSION: Intraoperative fluoroscopy during lumbar fusion. Electronically Signed   By: Narda Rutherford M.D.   On: 03/03/2023 20:09   DG C-Arm 1-60 Min-No Report  Result Date: 03/03/2023 Fluoroscopy was utilized by the requesting physician.  No radiographic interpretation.   DG C-Arm 1-60 Min-No Report  Result Date: 03/03/2023 Fluoroscopy was utilized by the requesting physician.  No radiographic interpretation.   DG C-Arm 1-60 Min-No Report  Result Date: 03/03/2023 Fluoroscopy was utilized by the requesting physician.  No radiographic  interpretation.    Assessment/Plan: Postop day 1 interbody fusions doing very well continue to mobilize with patient outpatient therapy probable discharge tomorrow  LOS: 1 day     Mariam Dollar 03/04/2023, 7:54 AM

## 2023-03-04 NOTE — Anesthesia Postprocedure Evaluation (Signed)
Anesthesia Post Note  Patient: Jill Holt  Procedure(s) Performed: Lumbar Three-Four, Lumbar Four-Five Posterior Lateral and Interbody Fusion (Spine Lumbar)     Patient location during evaluation: PACU Anesthesia Type: General Level of consciousness: awake and alert Pain management: pain level controlled Vital Signs Assessment: post-procedure vital signs reviewed and stable Respiratory status: spontaneous breathing, nonlabored ventilation, respiratory function stable and patient connected to nasal cannula oxygen Cardiovascular status: blood pressure returned to baseline and stable Postop Assessment: no apparent nausea or vomiting Anesthetic complications: no   No notable events documented.  Last Vitals:  Vitals:   03/03/23 1829 03/03/23 2340  BP: 106/78 94/65  Pulse: 86 94  Resp: 20 18  Temp: 36.7 C 36.5 C  SpO2: 97% 100%    Last Pain:  Vitals:   03/03/23 2340  TempSrc: Oral  PainSc:                  Maryori Weide

## 2023-03-04 NOTE — [place] FL2 (Addendum)
[place] MEDICAID FL2 LEVEL OF CARE FORM     IDENTIFICATION  Patient Name: Jill Holt Birthdate: August 25, 1970 Sex: female Admission Date (Current Location): 03/03/2023  [facility] and [place] Number:   Ignacia Palma)   Facility and Address:  [facility], [address], [place], [place] [address]      Provider Number: [id number]  Attending Physician Name and Address:  Donalee Citrin, MD  Relative Name and Phone Number:       Current Level of Care: Hospital Recommended Level of Care: Skilled Nursing Facility Prior Approval Number:    Date Approved/Denied:   PASRR Number:  [id number]  Discharge Plan: SNF    Current Diagnoses: Patient Active Problem List   Diagnosis Date Noted   Spinal stenosis of lumbar region 07/24/2022    Orientation RESPIRATION BLADDER Height & Weight     Self, Time, Situation, Place  Normal Continent Weight: 88.5 kg Height:  5\' 4"  (162.6 cm)  BEHAVIORAL SYMPTOMS/MOOD NEUROLOGICAL BOWEL NUTRITION STATUS      Continent Diet (Carb modified with thin liquids)  AMBULATORY STATUS COMMUNICATION OF NEEDS Skin   Extensive Assist Verbally Surgical wounds (back dressing post surgery)                       Personal Care Assistance Level of Assistance  Bathing, Feeding, Dressing Bathing Assistance: Limited assistance Feeding assistance: Independent Dressing Assistance: Limited assistance     Functional Limitations Info  Sight, Hearing, Speech Sight Info: Adequate Hearing Info: Adequate Speech Info: Adequate    SPECIAL CARE FACTORS FREQUENCY  PT (By licensed PT), OT (By licensed OT)     PT Frequency: 5x/wk OT Frequency: 5x/wk            Contractures Contractures Info: Not present    Additional Factors Info  Code Status, Allergies, Insulin Sliding Scale Code Status Info: Full Allergies Info: lisinopril   Insulin Sliding Scale Info: 70/30 insulin 25 units at dinner/ 70/30 insulin 35 units at breakfast        Current Medications (03/04/2023):  This is the current hospital active medication list Current Facility-Administered Medications  Medication Dose Route Frequency Provider Last Rate Last Admin   0.9 %  sodium chloride infusion  250 mL Intravenous Continuous Donalee Citrin, MD 1 mL/hr at 03/03/23 1850 250 mL at 03/03/23 1850   acetaminophen (TYLENOL) tablet 650 mg  650 mg Oral Q4H PRN Donalee Citrin, MD       Or   acetaminophen (TYLENOL) suppository 650 mg  650 mg Rectal Q4H PRN Donalee Citrin, MD       alum & mag hydroxide-simeth (MAALOX/MYLANTA) 200-200-20 MG/5ML suspension 30 mL  30 mL Oral Q6H PRN Donalee Citrin, MD       amLODipine (NORVASC) tablet 10 mg  10 mg Oral q AM Donalee Citrin, MD       atorvastatin (LIPITOR) tablet 80 mg  80 mg Oral Daily Donalee Citrin, MD   80 mg at 03/03/23 2130   bisacodyl (DULCOLAX) EC tablet 5-10 mg  5-10 mg Oral BID PRN Donalee Citrin, MD   10 mg at 03/04/23 0023   ceFAZolin (ANCEF) IVPB 2g/100 mL premix  2 g Intravenous Q8H Donalee Citrin, MD 200 mL/hr at 03/04/23 0545 2 g at 03/04/23 0545   cyclobenzaprine (FLEXERIL) tablet 10 mg  10 mg Oral TID PRN Donalee Citrin, MD   10 mg at 03/04/23 0541   docusate sodium (COLACE) capsule 100 mg  100 mg Oral BID Wynetta Emery,  Jillyn Hidden, MD   100 mg at 03/04/23 0913   gabapentin (NEURONTIN) capsule 600 mg  600 mg Oral BID Donalee Citrin, MD   600 mg at 03/04/23 0913   losartan (COZAAR) tablet 100 mg  100 mg Oral Daily Leander Rams, [facility]       And   hydrochlorothiazide (HYDRODIURIL) tablet 12.5 mg  12.5 mg Oral Daily Leander Rams, [facility]       HYDROcodone-acetaminophen [facility]) 10-325 MG per tablet 1 tablet  1 tablet Oral Q4H PRN Donalee Citrin, MD   1 tablet at 03/04/23 0918   HYDROcodone-acetaminophen (NORCO) 10-325 MG per tablet 2 tablet  2 tablet Oral Q4H PRN Donalee Citrin, MD       HYDROmorphone (DILAUDID) injection 0.5 mg  0.5 mg Intravenous Q2H PRN Donalee Citrin, MD       insulin aspart protamine- aspart (NOVOLOG MIX 70/30) injection 25 Units  25 Units Subcutaneous Q supper  Leander Rams, [facility]   25 Units at 03/03/23 2129   insulin aspart protamine- aspart (NOVOLOG MIX 70/30) injection 35 Units  35 Units Subcutaneous Q breakfast Leander Rams, [facility]       menthol-cetylpyridinium (CEPACOL) lozenge 3 mg  1 lozenge Oral PRN Donalee Citrin, MD       Or   phenol (CHLORASEPTIC) mouth spray 1 spray  1 spray Mouth/Throat PRN Donalee Citrin, MD       metoprolol succinate (TOPROL-XL) 24 hr tablet 25 mg  25 mg Oral Daily Donalee Citrin, MD       ondansetron [facility]) tablet 4 mg  4 mg Oral Q6H PRN Donalee Citrin, MD       Or   ondansetron [facility]) injection 4 mg  4 mg Intravenous Q6H PRN Donalee Citrin, MD       pantoprazole (PROTONIX) EC tablet 40 mg  40 mg Oral Daily Donalee Citrin, MD   40 mg at 03/04/23 0913   polyethylene glycol (MIRALAX / GLYCOLAX) packet 17 g  17 g Oral Daily PRN Donalee Citrin, MD       sodium chloride flush (NS) 0.9 % injection 3 mL  3 mL Intravenous Q12H Donalee Citrin, MD   3 mL at 03/03/23 2133   sodium chloride flush (NS) 0.9 % injection 3 mL  3 mL Intravenous PRN Donalee Citrin, MD         Discharge Medications: Please see discharge summary for a list of discharge medications.  Relevant Imaging Results:  Relevant Lab Results:   Additional Information SS#: [id number]  Kermit Balo, RN

## 2023-03-04 NOTE — Inpatient Diabetes Management (Signed)
Inpatient Diabetes Program Recommendations  AACE/ADA: New Consensus Statement on Inpatient Glycemic Control (2015)  Target Ranges:  Prepandial:   less than 140 mg/dL      Peak postprandial:   less than 180 mg/dL (1-2 hours)      Critically ill patients:  140 - 180 mg/dL   Lab Results  Component Value Date   GLUCAP 194 (H) 03/04/2023   HGBA1C 7.8 (H) 02/26/2023    Review of Glycemic Control  Latest Reference Range & Units 03/03/23 09:45 03/03/23 11:47 03/03/23 13:59 03/03/23 15:57 03/03/23 17:09 03/03/23 21:29 03/04/23 06:37  Glucose-Capillary 70 - 99 mg/dL 098 (H) 94 94 119 (H) 147 (H) 324 (H) 194 (H)  (H): Data is abnormally high  Diabetes history: DM2 Outpatient Diabetes medications: 70/30 35 units QAM and 25 units QPM, Glipizide 10 XL QD Current orders for Inpatient glycemic control: 70/30 35 units QAM and 25 units QHS  Inpatient Diabetes Program Recommendations:    Please consider:  Novolog 0-9 units TID.  Will continue to follow while inpatient.  Thank you, Dulce Sellar, MSN, CDCES Diabetes Coordinator Inpatient Diabetes Program [phone] (team pager from 8a-5p)

## 2023-03-05 LAB — GLUCOSE, CAPILLARY
Glucose-Capillary: 129 mg/dL — ABNORMAL HIGH (ref 70–99)
Glucose-Capillary: 81 mg/dL (ref 70–99)

## 2023-03-05 MED ORDER — PNEUMOCOCCAL 20-VAL CONJ VACC 0.5 ML IM SUSY
0.5000 mL | PREFILLED_SYRINGE | INTRAMUSCULAR | Status: DC
  Filled 2023-03-05: qty 0.5

## 2023-03-05 MED ORDER — AMLODIPINE BESYLATE 10 MG PO TABS
10.0000 mg | ORAL_TABLET | Freq: Every morning | ORAL | Status: DC
  Administered 2023-03-05 – 2023-03-07 (×3): 10 mg via ORAL
  Filled 2023-03-05 (×2): qty 1
  Filled 2023-03-05: qty 2

## 2023-03-05 NOTE — Progress Notes (Signed)
° °  Inpatient Rehab Admissions Coordinator : ° °Per therapy change in recommendations, patient was screened for CIR candidacy by Cane Dubray RN MSN.  At this time patient appears to be a potential candidate for CIR. I will place a rehab consult per protocol for full assessment. Please call me with any questions. ° °Jerami Tammen RN MSN °Admissions Coordinator °[phone] °  °

## 2023-03-05 NOTE — Plan of Care (Signed)
  Problem: Clinical Measurements: Goal: Will remain free from infection Outcome: Progressing Goal: Cardiovascular complication will be avoided Outcome: Progressing   Problem: Activity: Goal: Risk for activity intolerance will decrease Outcome: Progressing   

## 2023-03-05 NOTE — Progress Notes (Signed)
Physical Therapy Treatment Patient Details Name: Jill Holt MRN: [id number] DOB: Apr 25, 1971 Today's Date: 03/05/2023   History of Present Illness Pt is a 52 year old female who underwent a decompressive laminectomy and interbody fusions at L3-4 and L4-5 on 11/4. PMH/PSH:  previous laminotomies on the left at L3-4 and L4-5, DM, HTN, anemia    PT Comments  Pt continues with increased back pain limiting mobility tolerance. Pt with improved ability to complete bilat LAQ however continue to demo trace bilat active DF and 1/5 bilat hip flexion inhibiting pt to safely complete stair negotiation at this time without breaking back precautions. Pt very dependent on bilat UEs on RW when ambulating to clear bilat feet due to drop foot and fatigues quickly. At this time recommending inpatient rehab program > 3hrs a day to aide in strengthening bilat LEs to then progress towards independent function. Acute PT to cont to follow.    If plan is discharge home, recommend the following: A lot of help with walking and/or transfers;A lot of help with bathing/dressing/bathroom;Assist for transportation;Help with stairs or ramp for entrance   Can travel by private vehicle        Equipment Recommendations  None recommended by PT (has RW and 3n1 commode, would benefit from a reacher)    Recommendations for Other Services       Precautions / Restrictions Precautions Precautions: Back Precaution Booklet Issued: Yes (comment) Precaution Comments: pt able to recall 2/3 precautions Required Braces or Orthoses:  (no order for brace however pt has a brace) Restrictions Weight Bearing Restrictions: No     Mobility  Bed Mobility               General bed mobility comments: pt received sitting EOB    Transfers Overall transfer level: Needs assistance Equipment used: Rolling walker (2 wheels) Transfers: Sit to/from Stand Sit to Stand: Min assist           General transfer comment: minA to power  up, bed height raised, labored effort, increased time    Ambulation/Gait Ambulation/Gait assistance: Contact guard assist Gait Distance (Feet): 60 Feet Assistive device: Rolling walker (2 wheels) Gait Pattern/deviations: Step-to pattern, Decreased dorsiflexion - right, Decreased dorsiflexion - left, Decreased stride length, Trunk flexed Gait velocity: dec Gait velocity interpretation: <1.8 ft/sec, indicate of risk for recurrent falls   General Gait Details: pt very dependent on UEs, pt with increased difficulty clearing R foot compared to L however both LEs with decreased active DF. RW lowered to appropriate height for optimal support   Stairs Stairs: Yes       General stair comments: attempted to complete stair negotiation several different ways however unsuccessful. Pt unable to lift R or L LE up onto the step. Pt reports having to bend over railing at home to lift her R LE up on step in which she can't do now with her back precautions   Wheelchair Mobility     Tilt Bed    Modified Rankin (Stroke Patients Only)       Balance Overall balance assessment: Needs assistance Sitting-balance support: Feet supported, Single extremity supported Sitting balance-Leahy Scale: Fair     Standing balance support: Bilateral upper extremity supported, Reliant on assistive device for balance, During functional activity Standing balance-Leahy Scale: Fair Standing balance comment: pt able to stand at sink to wash hands with intermittent unilateral UE support  Cognition Arousal: Alert Behavior During Therapy: Anxious Overall Cognitive Status: No family/caregiver present to determine baseline cognitive functioning                                 General Comments: pt flat affect, delayed response time but ultimately able to follow all commands        Exercises      General Comments General comments (skin integrity, edema, etc.):  VSS, pt assisted to the bathroom, indep with hygiene      Pertinent Vitals/Pain Pain Assessment Pain Assessment: 0-10 Pain Score: 6  Pain Location: back Pain Descriptors / Indicators: Sharp Pain Intervention(s): Monitored during session    Home Living                          Prior Function            PT Goals (current goals can now be found in the care plan section) Acute Rehab PT Goals Patient Stated Goal: get better PT Goal Formulation: With patient Time For Goal Achievement: 03/18/23 Potential to Achieve Goals: Good Progress towards PT goals: Progressing toward goals    Frequency    Min 5X/week      PT Plan      Co-evaluation              AM-PAC PT "6 Clicks" Mobility   Outcome Measure  Help needed turning from your back to your side while in a flat bed without using bedrails?: A Little Help needed moving from lying on your back to sitting on the side of a flat bed without using bedrails?: A Little Help needed moving to and from a bed to a chair (including a wheelchair)?: A Little Help needed standing up from a chair using your arms (e.g., wheelchair or bedside chair)?: A Little Help needed to walk in hospital room?: A Little Help needed climbing 3-5 steps with a railing? : Total 6 Click Score: 16    End of Session Equipment Utilized During Treatment: Gait belt;Back brace Activity Tolerance: Patient limited by pain Patient left: with call bell/phone within reach;in chair;with nursing/sitter in room Nurse Communication: Mobility status PT Visit Diagnosis: Unsteadiness on feet (R26.81);Muscle weakness (generalized) (M62.81);Ataxic gait (R26.0)     Time: 1610-9604 PT Time Calculation (min) (ACUTE ONLY): 32 min  Charges:    $Gait Training: 8-22 mins $Therapeutic Exercise: 8-22 mins PT General Charges $$ ACUTE PT VISIT: 1 Visit                     Lewis Shock, PT, DPT Acute Rehabilitation Services Secure chat preferred Office  #: [phone]    Jill Holt 03/05/2023, 1:31 PM

## 2023-03-05 NOTE — TOC Progression Note (Addendum)
Transition of Care [facility]) - Progression Note    Patient Details  Name: Jill Holt MRN: [id number] Date of Birth: February 11, 1971  Transition of [facility]) CM/SW Contact  Kermit Balo, RN Phone Number: 03/05/2023, 10:39 AM  Clinical Narrative:   No bed offers yet. CM called [employer] again this am and left a voicemail for admitting. TOC following.  1546: CIR to evaluate.  Expected Discharge Plan: Skilled Nursing Facility Barriers to Discharge: Continued Medical Work up  Expected Discharge Plan and Services In-house Referral: Clinical Social Work Discharge Planning Services: CM Consult Post Acute Care Choice: Skilled Nursing Facility Living arrangements for the past 2 months: Apartment                                       Social Determinants of Health (SDOH) Interventions SDOH Screenings   Tobacco Use: Low Risk  (03/03/2023)    Readmission Risk Interventions     No data to display

## 2023-03-05 NOTE — Progress Notes (Signed)
Subjective: Patient reports condition of back pain still improved lower extremity radicular symptoms  Objective: Vital signs in last 24 hours: Temp:  [98.4 F (36.9 C)-99.2 F (37.3 C)] 98.9 F (37.2 C) (11/06 0823) Pulse Rate:  [93-104] 104 (11/06 0823) Resp:  [16-18] 16 (11/06 0823) BP: (100-122)/(64-72) 122/72 (11/06 0823) SpO2:  [92 %-95 %] 95 % (11/06 0823)  Intake/Output from previous day: 11/05 0701 - 11/06 0700 In: 720 [P.O.:720] Out: 130 [Drains:130] Intake/Output this shift: No intake/output data recorded.  Patient's neurologic exam remained stable bilateral foot drops for more pain with proximal leg on the right however strength is stable  Lab Results: No results for input(s): "WBC", "HGB", "HCT", "PLT" in the last 72 hours. BMET No results for input(s): "NA", "K", "CL", "CO2", "GLUCOSE", "BUN", "CREATININE", "CALCIUM" in the last 72 hours.  Studies/Results: DG Lumbar Spine 2-3 Views  Result Date: 03/03/2023 CLINICAL DATA:  Elective surgery. EXAM: LUMBAR SPINE - 2-3 VIEW COMPARISON:  None Available. FINDINGS: Two fluoroscopic spot views of the lumbar spine obtained in the operating room. Pedicle screws are present at L3, L4, and L5 with interbody spacers in place. Fluoroscopy time 1 minutes 1 seconds. Dose 40.90 mGy. IMPRESSION: Intraoperative fluoroscopy during lumbar fusion. Electronically Signed   By: Narda Rutherford M.D.   On: 03/03/2023 20:09   DG C-Arm 1-60 Min-No Report  Result Date: 03/03/2023 Fluoroscopy was utilized by the requesting physician.  No radiographic interpretation.   DG C-Arm 1-60 Min-No Report  Result Date: 03/03/2023 Fluoroscopy was utilized by the requesting physician.  No radiographic interpretation.   DG C-Arm 1-60 Min-No Report  Result Date: 03/03/2023 Fluoroscopy was utilized by the requesting physician.  No radiographic interpretation.    Assessment/Plan: Postop day 2 lumbar decompression fusion very slow progress increased pain  and mobility issues with foot drops patient will need placement to skilled nursing or rehab.  LOS: 2 days     Mariam Dollar 03/05/2023, 11:24 AM

## 2023-03-05 NOTE — Progress Notes (Signed)
Occupational Therapy Treatment Patient Details Name: Jill Holt MRN: [id number] DOB: [birth year] Today's Date: 03/05/2023   History of present illness Pt is a 52 year old female who underwent a decompressive laminectomy and interbody fusions at L3-4 and L4-5 on 11/4. PMH/PSH:  previous laminotomies on the left at L3-4 and L4-5, DM, HTN, anemia   OT comments  Pt is making incremental progress towards their acute OT goals. Overall pt's pain was better managed this date, she was sitting in the recliner eating lunch upon arrival. She needs close CGA for all transfers and mobility. While static standing pt benefits from at least 1 UE supported externally for balance and pain management. She remains limited by BLE weakness and needs significant assist fro LB ADLs. Pt expressed concern about stair negotiation as it was difficult pre-op and remains an barrier to d/c home. OT to continue to follow acutely to facilitate progress towards established goals. Pt will benefit from intensive inpatient follow up therapy, >3 hours/day after discharge.        If plan is discharge home, recommend the following:  A little help with walking and/or transfers;A lot of help with bathing/dressing/bathroom;Direct supervision/assist for medications management;Assistance with cooking/housework;Direct supervision/assist for financial management;Assist for transportation;Help with stairs or ramp for entrance   Equipment Recommendations  None recommended by OT    Recommendations for Other Services Rehab consult    Precautions / Restrictions Precautions Precautions: Back Precaution Booklet Issued: Yes (comment) Precaution Comments: pt able to recall 2/3 precautions Required Braces or Orthoses:  (no order for brace however pt has a brace) Restrictions Weight Bearing Restrictions: No       Mobility Bed Mobility               General bed mobility comments: OOb upon arrival    Transfers Overall transfer  level: Needs assistance Equipment used: Rolling walker (2 wheels) Transfers: Sit to/from Stand Sit to Stand: Contact guard assist           General transfer comment: increased time and effort     Balance Overall balance assessment: Needs assistance Sitting-balance support: Feet supported, Single extremity supported Sitting balance-Leahy Scale: Fair     Standing balance support: Bilateral upper extremity supported, Reliant on assistive device for balance, During functional activity Standing balance-Leahy Scale: Poor                             ADL either performed or assessed with clinical judgement   ADL Overall ADL's : Needs assistance/impaired Eating/Feeding: Independent   Grooming: Contact guard assist;Standing Grooming Details (indicate cue type and reason): needs at least 1 UE supported externally                 Toilet Transfer: Contact guard assist;Rolling walker (2 wheels) Toilet Transfer Details (indicate cue type and reason): remains limited by foot drop         Functional mobility during ADLs: Contact guard assist General ADL Comments: remains limited by BLE weakness    Extremity/Trunk Assessment Upper Extremity Assessment Upper Extremity Assessment: Overall WFL for tasks assessed   Lower Extremity Assessment Lower Extremity Assessment: Defer to PT evaluation        Vision   Vision Assessment?: No apparent visual deficits   Perception Perception Perception: Within Functional Limits   Praxis Praxis Praxis: WFL    Cognition Arousal: Alert Behavior During Therapy: Anxious Overall Cognitive Status: No family/caregiver present to determine baseline cognitive functioning  General Comments: overall WFL, recalled 3/3 precuations. anxious about rehab process              General Comments VSS    Pertinent Vitals/ Pain       Pain Assessment Pain Assessment: Faces Faces Pain Scale: Hurts little more Pain  Location: back Pain Descriptors / Indicators: Sharp Pain Intervention(s): Limited activity within patient's tolerance, Monitored during session   Frequency  Min 1X/week        Progress Toward Goals  OT Goals(current goals can now be found in the care plan section)  Progress towards OT goals: Progressing toward goals  Acute Rehab OT Goals Patient Stated Goal: to go to rehab OT Goal Formulation: With patient Time For Goal Achievement: 03/18/23 Potential to Achieve Goals: Good ADL Goals Pt Will Perform Grooming: with supervision;standing Pt Will Perform Lower Body Dressing: with supervision;sit to/from stand Pt Will Transfer to Toilet: with supervision;ambulating Additional ADL Goal #1: Pt will tolerate at least 8 minutes of OOB activity to demonstrate improved tolerance for safe discharge home   AM-PAC OT "6 Clicks" Daily Activity     Outcome Measure   Help from another person eating meals?: None Help from another person taking care of personal grooming?: A Little Help from another person toileting, which includes using toliet, bedpan, or urinal?: A Little Help from another person bathing (including washing, rinsing, drying)?: A Little Help from another person to put on and taking off regular upper body clothing?: A Little Help from another person to put on and taking off regular lower body clothing?: A Lot 6 Click Score: 18    End of Session Equipment Utilized During Treatment: Rolling walker (2 wheels)  OT Visit Diagnosis: Unsteadiness on feet (R26.81);Other abnormalities of gait and mobility (R26.89);Muscle weakness (generalized) (M62.81)   Activity Tolerance Patient tolerated treatment well   Patient Left in bed;with call bell/phone within reach   Nurse Communication Mobility status        Time: 1347-1401 OT Time Calculation (min): 14 min  Charges: OT General Charges $OT Visit: 1 Visit OT Treatments $Self Care/Home Management : 8-22 mins  Derenda Mis, OTR/L Acute Rehabilitation Services Office [phone] Secure Chat Communication Preferred   Donia Pounds 03/05/2023, 2:47 PM

## 2023-03-05 NOTE — Progress Notes (Signed)
Patient alert and oriented, void, ambulate with assistant. Report given to 5N nurse all questions answered. Patient transfer per order.

## 2023-03-06 DIAGNOSIS — M48061 Spinal stenosis, lumbar region without neurogenic claudication: Secondary | ICD-10-CM | POA: Diagnosis not present

## 2023-03-06 DIAGNOSIS — M21371 Foot drop, right foot: Secondary | ICD-10-CM

## 2023-03-06 DIAGNOSIS — M5416 Radiculopathy, lumbar region: Secondary | ICD-10-CM | POA: Diagnosis not present

## 2023-03-06 DIAGNOSIS — M21372 Foot drop, left foot: Secondary | ICD-10-CM | POA: Diagnosis not present

## 2023-03-06 MED ORDER — ORAL CARE MOUTH RINSE: 15.0000 mL | OROMUCOSAL | Status: DC | PRN

## 2023-03-06 NOTE — Consult Note (Signed)
Physical Medicine and Rehabilitation Consult Reason for Consult: Leg weakness and pain with impaired functional mobility Referring Physician: Wynetta Emery   HPI: Jill Holt is a 52 y.o. female with a history of diabetes, hypertension and iron deficiency anemia with prior lumbar laminectomy and decompression in March of this year he developed progressive back as well as bilateral hip and leg pain over the course of the last several months.  MRI revealed progression of degenerative disc disease as well as spinal stenosis at L3-L4 and L4-L5.  On March 03, 2023 patient opted for a redo decompressive laminectomies L3-L4 and L4-L5 with complete facetectomies and radical foraminotomies of the L3-L4-L5 nerve roots bilaterally by Dr. Wynetta Emery.  Patient also had transforaminal lumbar interbody fusions L3-L4 and L4-L5 with screw fixation and posterolateral arthrodesis.  Postoperatively patient reported improvement in her radicular pain.  She has been up with physical therapy who notes that she is very dependent on her upper extremities when using the rolling walker in order to clear bilateral foot drop which occurs.  Currently she is min assist for sit to stand transfers and contact-guard assist for 60 feet using a rolling walker.  She is only unable to complete stairs due to inability the left right or left legs up onto a step.  Patient has been using a rolling walker since March due to her bilateral foot drop.  She essentially sponge bathes but was able to dress herself.  She lives at home with her children and they 1 level house with 3 steps to enter.   Review of Systems  Constitutional:  Negative for fever.  HENT: Negative.    Eyes: Negative.   Respiratory: Negative.    Cardiovascular: Negative.   Gastrointestinal: Negative.   Genitourinary:  Negative for dysuria and frequency.  Musculoskeletal:  Positive for back pain, falls, joint pain and myalgias.  Skin: Negative.   Neurological:  Positive for  tingling, sensory change and focal weakness.  Psychiatric/Behavioral:  Negative for depression.    Past Medical History:  Diagnosis Date   Acute urinary retention    Anemia    Iron Deficiency Anemia   Arthritis    Constipation, acute    s/p each surgery   COVID 09/2018   had pneumonia   Diabetes mellitus without complication (HCC)    GERD (gastroesophageal reflux disease)    Hypertension    Pneumonia    Past Surgical History:  Procedure Laterality Date   CARPAL TUNNEL RELEASE Bilateral    FEMUR FRACTURE SURGERY Left    ganglion cyst removal Right    Hand   LUMBAR LAMINECTOMY/DECOMPRESSION MICRODISCECTOMY Left 07/24/2022   Procedure: Sublaminar decompression - left - Lumbar Three-Lumbar Four - Lumbar Four-Lumbar Five;  Surgeon: Donalee Citrin, MD;  Location: [facility] OR;  Service: Neurosurgery;  Laterality: Left;  3C   TUBAL LIGATION     1990s   History reviewed. No pertinent family history. Social History:  reports that she has never smoked. She has never been exposed to tobacco smoke. She has never used smokeless tobacco. She reports current alcohol use. She reports that she does not use drugs. Allergies:  Allergies  Allergen Reactions   Lisinopril Cough   Medications Prior to Admission  Medication Sig Dispense Refill   amLODipine (NORVASC) 10 MG tablet Take 10 mg by mouth in the morning.     atorvastatin (LIPITOR) 80 MG tablet Take 1 tablet by mouth daily.     cyclobenzaprine (FLEXERIL) 10 MG tablet Take 1 tablet (10  mg total) by mouth 3 (three) times daily as needed for muscle spasms. 30 tablet 0   gabapentin (NEURONTIN) 300 MG capsule Take 600 mg by mouth 2 (two) times daily.     glipiZIDE (GLUCOTROL XL) 10 MG 24 hr tablet Take 1 tablet by mouth daily.     insulin isophane & regular human KwikPen (HUMULIN 70/30 KWIKPEN) (70-30) 100 UNIT/ML KwikPen Inject 25-35 Units into the skin See admin instructions. Inject 35 units subcutaneously in the morning & inject 25 units  subcutaneously at night     losartan-hydrochlorothiazide (HYZAAR) 100-12.5 MG tablet Take 1 tablet by mouth in the morning.     metoprolol succinate (TOPROL-XL) 25 MG 24 hr tablet Take 25 mg by mouth daily.     omeprazole (PRILOSEC) 40 MG capsule Take 40 mg by mouth daily before breakfast.     HYDROcodone-acetaminophen (NORCO/VICODIN) 5-325 MG tablet Take 1 tablet by mouth every 4 (four) hours as needed for severe pain ((score 7 to 10)). (Patient not taking: Reported on 02/18/2023) 30 tablet 0   ondansetron (ZOFRAN-ODT) 4 MG disintegrating tablet 4mg  ODT q4 hours prn nausea/vomit 20 tablet 0   polyethylene glycol (MIRALAX) 17 g packet Take 17 g by mouth daily. Take additional doses initially for bowel cleanout. (Patient taking differently: Take 17 g by mouth daily as needed for moderate constipation.) 30 each 0    Home: Home Living Family/patient expects to be discharged to:: Private residence Living Arrangements: Children Available Help at Discharge: Family, Available PRN/intermittently Type of Home: Other(Comment) Home Access: Stairs to enter [employer] of Steps: 3 Entrance Stairs-Rails: Left Home Layout: One level Bathroom Shower/Tub: Engineer, manufacturing systems: Standard Bathroom Accessibility: Yes Home Equipment: Agricultural consultant (2 wheels), Rollator (4 wheels), BSC/3in1 Additional Comments: pt had bed taken off bed frame to sit lower; her dtr is currently on maternity leave  Functional History: Prior Function Prior Level of Function : Needs assist Mobility Comments: pt reports using a RW since march however had a fall and began using a rollator however the brakes don't work per pt report. Pt very dependent on UEs to get up the stairs (pt showed PT a video from her blink camera on how to goes up the stairs" ADLs Comments: doesn't get in the tub, was able to dress/bath self Functional Status:  Mobility: Bed Mobility Overal bed mobility: Needs Assistance Bed  Mobility: Sit to Sidelying, Rolling Rolling: Contact guard assist Sidelying to sit: Mod assist Sit to sidelying: Mod assist General bed mobility comments: OOb upon arrival Transfers Overall transfer level: Needs assistance Equipment used: Rolling walker (2 wheels) Transfers: Sit to/from Stand Sit to Stand: Contact guard assist General transfer comment: increased time and effort Ambulation/Gait Ambulation/Gait assistance: Contact guard assist Gait Distance (Feet): 60 Feet Assistive device: Rolling walker (2 wheels) Gait Pattern/deviations: Step-to pattern, Decreased dorsiflexion - right, Decreased dorsiflexion - left, Decreased stride length, Trunk flexed General Gait Details: pt very dependent on UEs, pt with increased difficulty clearing R foot compared to L however both LEs with decreased active DF. RW lowered to appropriate height for optimal support Gait velocity: dec Gait velocity interpretation: <1.8 ft/sec, indicate of risk for recurrent falls Stairs: Yes General stair comments: attempted to complete stair negotiation several different ways however unsuccessful. Pt unable to lift R or L LE up onto the step. Pt reports having to bend over railing at home to lift her R LE up on step in which she can't do now with her back precautions  ADL: ADL Overall ADL's : Needs assistance/impaired Eating/Feeding: Independent Grooming: Contact guard assist, Standing Grooming Details (indicate cue type and reason): needs at least 1 UE supported externally Upper Body Bathing: Set up, Sitting Lower Body Bathing: Moderate assistance, Sit to/from stand Upper Body Dressing : Set up, Sitting Lower Body Dressing: Moderate assistance, Sit to/from stand Lower Body Dressing Details (indicate cue type and reason): pt not attempting figure four despite cues Toilet Transfer: Contact guard assist, Rolling walker (2 wheels) Toilet Transfer Details (indicate cue type and reason): remains limited by foot  drop Toileting- Clothing Manipulation and Hygiene: Supervision/safety, Sitting/lateral lean Functional mobility during ADLs: Contact guard assist General ADL Comments: remains limited by BLE weakness  Cognition: Cognition Overall Cognitive Status: No family/caregiver present to determine baseline cognitive functioning Orientation Level: Oriented X4 Cognition Arousal: Alert Behavior During Therapy: Anxious Overall Cognitive Status: No family/caregiver present to determine baseline cognitive functioning General Comments: overall WFL, recalled 3/3 precuations. anxious about rehab process  Blood pressure 138/73, pulse (!) 127, temperature 99.5 F (37.5 C), temperature source Oral, resp. rate 20, height 5\' 4"  (1.626 m), weight 88.5 kg, last menstrual period 05/31/2022, SpO2 96%. Physical Exam Constitutional:      General: She is not in acute distress.    Appearance: She is obese.  HENT:     Head: Normocephalic.     Right Ear: External ear normal.     Left Ear: External ear normal.     Nose: Nose normal.  Eyes:     Pupils: Pupils are equal, round, and reactive to light.  Cardiovascular:     Rate and Rhythm: Normal rate.  Pulmonary:     Effort: Pulmonary effort is normal.  Abdominal:     Palpations: Abdomen is soft.  Musculoskeletal:        General: Tenderness present. No swelling.     Cervical back: Normal range of motion.  Skin:    General: Skin is warm.     Comments: Surgical site dressed  Neurological:     Mental Status: She is alert.     Comments: Alert and oriented x 3. Normal insight and awareness. Intact Memory. Normal language and speech. Cranial nerve exam unremarkable. MMT: UE 5/5. RLE 3 to 4-/5 prox (pain) to 2-3/5 APF, 0-tr ADF with pain components. LLE 3-4/5 prox to 1-2/5 APF and 0-tr ADF. Decreased PP/LT in both feet L>R. DTR's absent.Marland Kitchen    Psychiatric:        Mood and Affect: Mood normal.     Results for orders placed or performed during the hospital encounter  of 03/03/23 (from the past 24 hour(s))  Glucose, capillary     Status: None   Collection Time: 03/05/23 12:49 PM  Result Value Ref Range   Glucose-Capillary 81 70 - 99 mg/dL   Comment 1 Notify RN    Comment 2 Document in Chart    No results found.  Assessment/Plan: Diagnosis: 52 year old female status post lumbar decompressive laminectomy and fusion due to severe foraminal stenosis.  Patient with persistent bilateral foot drop and radicular pain. Does the need for close, 24 hr/day medical supervision in concert with the patient's rehab needs make it unreasonable for this patient to be served in a less intensive setting? Yes Co-Morbidities requiring supervision/potential complications:  -Postoperative pain -OA of right knee which has been compensating for weaker left side. -Needs AFO assessment and fitting for  bilateral foot drop -Diabetes Due to bladder management, bowel management, safety, skin/wound care, disease management, medication administration, pain management,  and patient education, does the patient require 24 hr/day rehab nursing? Yes Does the patient require coordinated care of a physician, rehab nurse, therapy disciplines of PT, OT to address physical and functional deficits in the context of the above medical diagnosis(es)? Yes Addressing deficits in the following areas: balance, endurance, locomotion, strength, transferring, bowel/bladder control, bathing, dressing, feeding, grooming, toileting, and psychosocial support Can the patient actively participate in an intensive therapy program of at least 3 hrs of therapy per day at least 5 days per week? Yes The potential for patient to make measurable gains while on inpatient rehab is excellent Anticipated functional outcomes upon discharge from inpatient rehab are modified independent  with PT, modified independent with OT, n/a with SLP. Estimated rehab length of stay to reach the above functional goals is: 8-11 days Anticipated  discharge destination: Home Overall Rehab/Functional Prognosis: excellent  POST ACUTE RECOMMENDATIONS: This patient's condition is appropriate for continued rehabilitative care in the following setting: CIR Patient has agreed to participate in recommended program. Yes Note that insurance prior authorization may be required for reimbursement for recommended care.  Comment: Pt lives with daughter who will have to work. She will need to be able to get around her home independently, navigate 3 stairs to enter, etc. Rehab Admissions Coordinator to follow up.     MEDICAL RECOMMENDATIONS: Will order bilateral PRAFO's to keep heel cords stretched at rest Increase gabapentin to 600mg  TID for neuropathic pain   I have personally performed a face to face diagnostic evaluation of this patient. Additionally, I have examined the patient's medical record including any pertinent labs and radiographic images. If the physician assistant has documented in this note, I have reviewed and edited or otherwise concur with the physician assistant's documentation.  Thanks,  [name], MD 03/06/2023

## 2023-03-06 NOTE — Progress Notes (Signed)
Physical Therapy Treatment Patient Details Name: Jill Holt MRN: [id number] DOB: June 24, 1970 Today's Date: 03/06/2023   History of Present Illness Pt is a 51 year old female who underwent a decompressive laminectomy and interbody fusions at L3-4 and L4-5 on 11/4. PMH/PSH:  previous laminotomies on the left at L3-4 and L4-5, DM, HTN, anemia    PT Comments  Pt greeted resting in bed and agreeable to session. Pt with slow progress this session, limited by pain and nausea/dizziness. Pt demonstrating gait with CGA for safety with continued decreased bil LE clearance due to foot drop and decreased active hip flexion with distance limited by pt fatigue. Pt continues with heavy reliance on UE support on RW, cued pt for closer RW proximity for optimal hand placement to off weight Les with pt able to correct and endorsing improvement. Current plan remains appropriate to address deficits and maximize functional independence and decrease caregiver burden. Pt continues to benefit from skilled PT services to progress toward functional mobility goals.     If plan is discharge home, recommend the following: A lot of help with walking and/or transfers;A lot of help with bathing/dressing/bathroom;Assist for transportation;Help with stairs or ramp for entrance   Can travel by private vehicle     No  Equipment Recommendations  None recommended by PT (has RW and 3n1 commode, would benefit from a reacher)    Recommendations for Other Services       Precautions / Restrictions Precautions Precautions: Back Precaution Booklet Issued: Yes (comment) Required Braces or Orthoses:  (no order for brace however pt has a brace) Restrictions Weight Bearing Restrictions: No     Mobility  Bed Mobility Overal bed mobility: Needs Assistance Bed Mobility: Sit to Sidelying, Rolling, Sidelying to Sit Rolling: Contact guard assist Sidelying to sit: Mod assist     Sit to sidelying: Mod assist General bed mobility  comments: mod A to elevat trunk to sitting and return LEs to bed at end of session    Transfers Overall transfer level: Needs assistance Equipment used: Rolling walker (2 wheels) Transfers: Sit to/from Stand Sit to Stand: Contact guard assist, Min assist           General transfer comment: increased time and effort, min A from low chair    Ambulation/Gait Ambulation/Gait assistance: Contact guard assist Gait Distance (Feet): 40 Feet Assistive device: Rolling walker (2 wheels) Gait Pattern/deviations: Step-to pattern, Decreased dorsiflexion - right, Decreased dorsiflexion - left, Decreased stride length, Trunk flexed Gait velocity: dec     General Gait Details: pt very dependent on UEs, pt with increased difficulty clearing R foot compared to L however both LEs with decreased active DF. distance limited to pt stated [place] with pt needing to sit   Stairs             Wheelchair Mobility     Tilt Bed    Modified Rankin (Stroke Patients Only)       Balance Overall balance assessment: Needs assistance Sitting-balance support: Feet supported, Single extremity supported Sitting balance-Leahy Scale: Fair     Standing balance support: Bilateral upper extremity supported, Reliant on assistive device for balance, During functional activity Standing balance-Leahy Scale: Poor                              Cognition Arousal: Alert Behavior During Therapy: Anxious Overall Cognitive Status: No family/caregiver present to determine baseline cognitive functioning  General Comments: overall WFL, recalled 3/3 precuations.        Exercises      General Comments General comments (skin integrity, edema, etc.): VSS      Pertinent Vitals/Pain Pain Assessment Pain Assessment: Faces Faces Pain Scale: Hurts even more Pain Location: back Pain Descriptors / Indicators: Discomfort, Grimacing, Guarding Pain  Intervention(s): Premedicated before session, Monitored during session, Limited activity within patient's tolerance    Home Living                          Prior Function            PT Goals (current goals can now be found in the care plan section) Acute Rehab PT Goals Patient Stated Goal: get better PT Goal Formulation: With patient Time For Goal Achievement: 03/18/23 Progress towards PT goals: Progressing toward goals    Frequency    Min 5X/week      PT Plan      Co-evaluation              AM-PAC PT "6 Clicks" Mobility   Outcome Measure  Help needed turning from your back to your side while in a flat bed without using bedrails?: A Little Help needed moving from lying on your back to sitting on the side of a flat bed without using bedrails?: A Little Help needed moving to and from a bed to a chair (including a wheelchair)?: A Little Help needed standing up from a chair using your arms (e.g., wheelchair or bedside chair)?: A Little Help needed to walk in hospital room?: A Little Help needed climbing 3-5 steps with a railing? : Total 6 Click Score: 16    End of Session Equipment Utilized During Treatment: Back brace Activity Tolerance: Patient limited by pain Patient left: with call bell/phone within reach;in bed Nurse Communication: Mobility status PT Visit Diagnosis: Unsteadiness on feet (R26.81);Muscle weakness (generalized) (M62.81);Ataxic gait (R26.0)     Time: 1610-9604 PT Time Calculation (min) (ACUTE ONLY): 30 min  Charges:    $Gait Training: 23-37 mins PT General Charges $$ ACUTE PT VISIT: 1 Visit                     Jill Holt R. PTA Acute Rehabilitation Services Office: [phone]   Catalina Antigua 03/06/2023, 3:44 PM

## 2023-03-06 NOTE — Progress Notes (Signed)
PT Cancellation Note  Patient Details Name: Jill Holt MRN: [id number] DOB: August 01, 1970   Cancelled Treatment:    Reason Eval/Treat Not Completed: (P) Pain limiting ability to participate, pt declining mobility this AM stating she has not had pain medication and RLE is too painful to participate at this time. Will check back as schedule allows to continue with PT POC.  Lenora Boys. PTA Acute Rehabilitation Services Office: [phone]    Catalina Antigua 03/06/2023, 9:11 AM

## 2023-03-06 NOTE — Progress Notes (Signed)
Subjective: Patient reports  making slow progress condition of back pain improved lower extremity radicular symptoms  Objective: Vital signs in last 24 hours: Temp:  [98.6 F (37 C)-98.9 F (37.2 C)] 98.7 F (37.1 C) (11/06 2200) Pulse Rate:  [104-110] 106 (11/07 0600) Resp:  [14-18] 14 (11/07 0600) BP: (114-135)/(71-78) 135/78 (11/07 0600) SpO2:  [93 %-99 %] 96 % (11/07 0600)  Intake/Output from previous day: 11/06 0701 - 11/07 0700 In: 660 [P.O.:460; IV Piggyback:200] Out: 10 [Drains:10] Intake/Output this shift: No intake/output data recorded.  Baseline stable baseline foot drops incision clean dry and intact  Lab Results: No results for input(s): "WBC", "HGB", "HCT", "PLT" in the last 72 hours. BMET No results for input(s): "NA", "K", "CL", "CO2", "GLUCOSE", "BUN", "CREATININE", "CALCIUM" in the last 72 hours.  Studies/Results: No results found.  Assessment/Plan: Postop day 3 lumbar fusion making slow progress working on rehab placement continue physical Occupational Therapy.  LOS: 3 days     Mariam Dollar 03/06/2023, 7:41 AM

## 2023-03-07 ENCOUNTER — Encounter (HOSPITAL_COMMUNITY): Payer: Self-pay | Admitting: Physical Medicine and Rehabilitation

## 2023-03-07 ENCOUNTER — Inpatient Hospital Stay (HOSPITAL_COMMUNITY)
Admission: AD | Admit: 2023-03-07 | Discharge: 2023-03-20 | DRG: 552 | Disposition: A | Discharge status: Home Health | Payer: Medicaid Other | Source: Intra-hospital | Attending: Physical Medicine and Rehabilitation | Admitting: Physical Medicine and Rehabilitation

## 2023-03-07 ENCOUNTER — Other Ambulatory Visit: Payer: Self-pay

## 2023-03-07 DIAGNOSIS — Z981 Arthrodesis status: Secondary | ICD-10-CM

## 2023-03-07 DIAGNOSIS — Z7984 Long term (current) use of oral hypoglycemic drugs: Secondary | ICD-10-CM

## 2023-03-07 DIAGNOSIS — Z79899 Other long term (current) drug therapy: Secondary | ICD-10-CM | POA: Diagnosis not present

## 2023-03-07 DIAGNOSIS — Z888 Allergy status to other drugs, medicaments and biological substances status: Secondary | ICD-10-CM | POA: Diagnosis not present

## 2023-03-07 DIAGNOSIS — L600 Ingrowing nail: Secondary | ICD-10-CM | POA: Diagnosis present

## 2023-03-07 DIAGNOSIS — M21372 Foot drop, left foot: Secondary | ICD-10-CM | POA: Diagnosis present

## 2023-03-07 DIAGNOSIS — E119 Type 2 diabetes mellitus without complications: Secondary | ICD-10-CM | POA: Diagnosis present

## 2023-03-07 DIAGNOSIS — K219 Gastro-esophageal reflux disease without esophagitis: Secondary | ICD-10-CM | POA: Diagnosis present

## 2023-03-07 DIAGNOSIS — M5416 Radiculopathy, lumbar region: Principal | ICD-10-CM | POA: Diagnosis present

## 2023-03-07 DIAGNOSIS — Z4889 Encounter for other specified surgical aftercare: Secondary | ICD-10-CM

## 2023-03-07 DIAGNOSIS — M21371 Foot drop, right foot: Secondary | ICD-10-CM | POA: Diagnosis present

## 2023-03-07 DIAGNOSIS — E785 Hyperlipidemia, unspecified: Secondary | ICD-10-CM | POA: Diagnosis present

## 2023-03-07 DIAGNOSIS — R0902 Hypoxemia: Secondary | ICD-10-CM | POA: Diagnosis present

## 2023-03-07 DIAGNOSIS — Z8701 Personal history of pneumonia (recurrent): Secondary | ICD-10-CM | POA: Diagnosis not present

## 2023-03-07 DIAGNOSIS — T466X5A Adverse effect of antihyperlipidemic and antiarteriosclerotic drugs, initial encounter: Secondary | ICD-10-CM | POA: Diagnosis not present

## 2023-03-07 DIAGNOSIS — I10 Essential (primary) hypertension: Secondary | ICD-10-CM | POA: Diagnosis present

## 2023-03-07 DIAGNOSIS — K5901 Slow transit constipation: Secondary | ICD-10-CM

## 2023-03-07 DIAGNOSIS — Z8616 Personal history of COVID-19: Secondary | ICD-10-CM | POA: Diagnosis not present

## 2023-03-07 DIAGNOSIS — R32 Unspecified urinary incontinence: Secondary | ICD-10-CM | POA: Diagnosis not present

## 2023-03-07 DIAGNOSIS — G8220 Paraplegia, unspecified: Secondary | ICD-10-CM | POA: Diagnosis not present

## 2023-03-07 DIAGNOSIS — Z8739 Personal history of other diseases of the musculoskeletal system and connective tissue: Secondary | ICD-10-CM

## 2023-03-07 DIAGNOSIS — K5900 Constipation, unspecified: Secondary | ICD-10-CM | POA: Diagnosis present

## 2023-03-07 DIAGNOSIS — M48061 Spinal stenosis, lumbar region without neurogenic claudication: Secondary | ICD-10-CM | POA: Diagnosis present

## 2023-03-07 DIAGNOSIS — G8918 Other acute postprocedural pain: Secondary | ICD-10-CM | POA: Diagnosis not present

## 2023-03-07 DIAGNOSIS — D509 Iron deficiency anemia, unspecified: Secondary | ICD-10-CM | POA: Diagnosis present

## 2023-03-07 DIAGNOSIS — M7989 Other specified soft tissue disorders: Secondary | ICD-10-CM | POA: Diagnosis not present

## 2023-03-07 DIAGNOSIS — R110 Nausea: Secondary | ICD-10-CM | POA: Diagnosis not present

## 2023-03-07 DIAGNOSIS — R000 Tachycardia, unspecified: Secondary | ICD-10-CM | POA: Diagnosis not present

## 2023-03-07 DIAGNOSIS — D62 Acute posthemorrhagic anemia: Secondary | ICD-10-CM | POA: Diagnosis not present

## 2023-03-07 DIAGNOSIS — G8929 Other chronic pain: Secondary | ICD-10-CM | POA: Diagnosis present

## 2023-03-07 LAB — GLUCOSE, CAPILLARY
Glucose-Capillary: 111 mg/dL — ABNORMAL HIGH (ref 70–99)
Glucose-Capillary: 181 mg/dL — ABNORMAL HIGH (ref 70–99)
Glucose-Capillary: 171 mg/dL — ABNORMAL HIGH (ref 70–99)

## 2023-03-07 MED ORDER — BISACODYL 5 MG PO TBEC: 5.0000 mg | DELAYED_RELEASE_TABLET | Freq: Two times a day (BID) | ORAL | Status: DC | PRN

## 2023-03-07 MED ORDER — AMLODIPINE BESYLATE 10 MG PO TABS
10.0000 mg | ORAL_TABLET | Freq: Every morning | ORAL | Status: DC
  Administered 2023-03-08 – 2023-03-20 (×13): 10 mg via ORAL
  Filled 2023-03-07 (×14): qty 1

## 2023-03-07 MED ORDER — METOPROLOL SUCCINATE ER 25 MG PO TB24
25.0000 mg | ORAL_TABLET | Freq: Every day | ORAL | Status: DC
  Administered 2023-03-08 – 2023-03-12 (×5): 25 mg via ORAL
  Filled 2023-03-07 (×5): qty 1

## 2023-03-07 MED ORDER — DOCUSATE SODIUM 100 MG PO CAPS
100.0000 mg | ORAL_CAPSULE | Freq: Two times a day (BID) | ORAL | Status: DC
  Administered 2023-03-07 – 2023-03-17 (×19): 100 mg via ORAL
  Filled 2023-03-07 (×24): qty 1

## 2023-03-07 MED ORDER — INSULIN ASPART PROT & ASPART (70-30 MIX) 100 UNIT/ML ~~LOC~~ SUSP
25.0000 [IU] | Freq: Every day | SUBCUTANEOUS | Status: DC
  Administered 2023-03-07 – 2023-03-13 (×7): 25 [IU] via SUBCUTANEOUS

## 2023-03-07 MED ORDER — LOSARTAN POTASSIUM 50 MG PO TABS
100.0000 mg | ORAL_TABLET | Freq: Every day | ORAL | Status: DC
  Administered 2023-03-08 – 2023-03-20 (×13): 100 mg via ORAL
  Filled 2023-03-07 (×14): qty 2

## 2023-03-07 MED ORDER — INSULIN ASPART PROT & ASPART (70-30 MIX) 100 UNIT/ML ~~LOC~~ SUSP
35.0000 [IU] | Freq: Every day | SUBCUTANEOUS | Status: DC
  Administered 2023-03-08 – 2023-03-18 (×6): 35 [IU] via SUBCUTANEOUS

## 2023-03-07 MED ORDER — ACETAMINOPHEN 650 MG RE SUPP
650.0000 mg | RECTAL | Status: DC | PRN
Start: 2023-03-07 — End: 2023-03-20

## 2023-03-07 MED ORDER — CYCLOBENZAPRINE HCL 10 MG PO TABS: 10.0000 mg | ORAL_TABLET | Freq: Three times a day (TID) | ORAL | 0 refills | Status: DC | PRN

## 2023-03-07 MED ORDER — ONDANSETRON HCL 4 MG PO TABS
4.0000 mg | ORAL_TABLET | Freq: Four times a day (QID) | ORAL | Status: DC | PRN
Start: 2023-03-07 — End: 2023-03-20

## 2023-03-07 MED ORDER — ACETAMINOPHEN 325 MG PO TABS
650.0000 mg | ORAL_TABLET | ORAL | Status: DC | PRN
Start: 2023-03-07 — End: 2023-03-20
  Administered 2023-03-08 – 2023-03-17 (×2): 650 mg via ORAL
  Filled 2023-03-07 (×4): qty 2

## 2023-03-07 MED ORDER — GABAPENTIN 300 MG PO CAPS
600.0000 mg | ORAL_CAPSULE | Freq: Three times a day (TID) | ORAL | Status: DC
  Administered 2023-03-07 – 2023-03-12 (×15): 600 mg via ORAL
  Filled 2023-03-07 (×15): qty 2

## 2023-03-07 MED ORDER — HYDROCODONE-ACETAMINOPHEN 5-325 MG PO TABS
1.0000 | ORAL_TABLET | ORAL | Status: DC | PRN
  Administered 2023-03-08: 1 via ORAL
  Filled 2023-03-07: qty 1

## 2023-03-07 MED ORDER — HYDROCHLOROTHIAZIDE 12.5 MG PO TABS
12.5000 mg | ORAL_TABLET | Freq: Every day | ORAL | Status: DC
  Administered 2023-03-08 – 2023-03-20 (×13): 12.5 mg via ORAL
  Filled 2023-03-07 (×13): qty 1

## 2023-03-07 MED ORDER — PANTOPRAZOLE SODIUM 40 MG PO TBEC
40.0000 mg | DELAYED_RELEASE_TABLET | Freq: Every day | ORAL | Status: DC
  Administered 2023-03-08 – 2023-03-20 (×13): 40 mg via ORAL
  Filled 2023-03-07 (×13): qty 1

## 2023-03-07 MED ORDER — POLYETHYLENE GLYCOL 3350 17 G PO PACK: 17.0000 g | PACK | Freq: Every day | ORAL | Status: DC | PRN

## 2023-03-07 MED ORDER — CYCLOBENZAPRINE HCL 5 MG PO TABS
10.0000 mg | ORAL_TABLET | Freq: Three times a day (TID) | ORAL | Status: DC | PRN
Start: 2023-03-07 — End: 2023-03-20
  Administered 2023-03-10 – 2023-03-18 (×7): 10 mg via ORAL
  Filled 2023-03-07 (×7): qty 2

## 2023-03-07 MED ORDER — HYDROCODONE-ACETAMINOPHEN 10-325 MG PO TABS: 1.0000 | ORAL_TABLET | ORAL | 0 refills | Status: DC | PRN

## 2023-03-07 MED ORDER — ONDANSETRON HCL 4 MG/2ML IJ SOLN
4.0000 mg | Freq: Four times a day (QID) | INTRAMUSCULAR | Status: DC | PRN
Start: 2023-03-07 — End: 2023-03-20

## 2023-03-07 MED ORDER — GLIPIZIDE ER 10 MG PO TB24
10.0000 mg | ORAL_TABLET | Freq: Every day | ORAL | Status: DC
  Administered 2023-03-08 – 2023-03-20 (×13): 10 mg via ORAL
  Filled 2023-03-07 (×13): qty 1

## 2023-03-07 MED ORDER — ATORVASTATIN CALCIUM 80 MG PO TABS
80.0000 mg | ORAL_TABLET | Freq: Every day | ORAL | Status: DC
  Administered 2023-03-08 – 2023-03-14 (×7): 80 mg via ORAL
  Filled 2023-03-07 (×8): qty 1

## 2023-03-07 NOTE — Progress Notes (Signed)
Physical Therapy Treatment Patient Details Name: Jill Holt MRN: [id number] DOB: 03-Jul-1970 Today's Date: 03/07/2023   History of Present Illness Pt is a 52 year old female who underwent a decompressive laminectomy and interbody fusions at L3-4 and L4-5 on 11/4. PMH/PSH:  previous laminotomies on the left at L3-4 and L4-5, DM, HTN, anemia.    PT Comments  Pt received in chair, agreeable to therapy session, with good participation in transfer and gait training as able. Pt with continued BLE foot drop, with improved technique with LLE wrapped into dorsiflexion, would benefit from BLE wrapped to promote dorsiflexion next session. SpO2 desaturates on RA while in chair but improves to >92% on RA while standing/ambulating, pt encouraged to continue IS hourly and pursed-lip breathing, pt returned to 2L O2 [place] at rest. Worked on standing hip flexion including foot taps on platform and safe RW management. Pt performs transfers and gait with CGA up to light modA this date. Pt continues to benefit from PT services to progress toward functional mobility goals.    If plan is discharge home, recommend the following: A lot of help with walking and/or transfers;A lot of help with bathing/dressing/bathroom;Assist for transportation;Help with stairs or ramp for entrance   Can travel by private vehicle     No  Equipment Recommendations  None recommended by PT (has RW and 3n1 commode, would benefit from a reacher)    Recommendations for Other Services       Precautions / Restrictions Precautions Precautions: Back Precaution Booklet Issued: Yes (comment) Required Braces or Orthoses: Other Brace Other Brace: no brace ordered but pt has LSO corset brace on during session Restrictions Weight Bearing Restrictions: No     Mobility  Bed Mobility Overal bed mobility: Needs Assistance             General bed mobility comments: Pt received up in chair, needs heavy modA external assist to bring trunk to  long sitting from reclined back posture initially.    Transfers Overall transfer level: Needs assistance Equipment used: Rolling walker (2 wheels) Transfers: Sit to/from Stand Sit to Stand: Min assist, Mod assist           General transfer comment: from chair heavy minA/light modA initially to RW due to fatigue after working with OT earlier, then minA second transfer. Good compliance with back precs    Ambulation/Gait Ambulation/Gait assistance: Contact guard assist Gait Distance (Feet): 30 Feet Assistive device: Rolling walker (2 wheels) Gait Pattern/deviations: Step-to pattern, Decreased dorsiflexion - right, Decreased dorsiflexion - left, Decreased stride length, Trunk flexed Gait velocity: dec     General Gait Details: Heavy reliance on BUE support of RW, short shuffled bil steps, pt LLE wrapped into dorsiflexion prior to trial and pt reporting it is somewhat helpful, but still with significant RLE foot drop (pt had some active DF on RLE while seated but appears to fatigue so would benefit from RLE DF wrapping prior to next PT session vs BLE AFOs). Slow careful pace, very short/low steps. Chair follow for safety provided by her daughter. SpO2 WFL 95-96% on RA.q   Stairs Stairs: Yes       General stair comments: Pt unable to lift R or L LE up onto the step. Pt reports having to bend over railing at home to lift her R LE up on step in which she can't do now with her back precautions. PTA provided AAROM to lift her RLE to tap on to 4" curb step x 3 reps prior to pt  c/o fatigue and wanting to walk back to chair at bedside. SpO2 WFL on RA   Wheelchair Mobility     Tilt Bed    Modified Rankin (Stroke Patients Only)       Balance Overall balance assessment: Needs assistance Sitting-balance support: Feet supported, Bilateral upper extremity supported Sitting balance-Leahy Scale: Fair Sitting balance - Comments: posterior lean when long sitting/forward sitting in chair  unsupported (poor), Fair with BUE support for long sitting   Standing balance support: Bilateral upper extremity supported, During functional activity, Reliant on assistive device for balance Standing balance-Leahy Scale: Poor Standing balance comment: heavy reliance on RW for dynamic standing tasks                            Cognition Arousal: Alert Behavior During Therapy: Anxious, Flat affect Overall Cognitive Status: No family/caregiver present to determine baseline cognitive functioning                                 General Comments: Daughter present and encouraging, pt agreeable to participate with encouragement, motivated to gain strength. Good awareness of back precs.        Exercises Other Exercises Other Exercises: standing AROM hip flexion x5 reps ea (limited ROM); AAROM hip flexion RLE x3 reps (foot taps on curb step) Other Exercises: IS x 5 reps (pt achieves ~1250) encouraged hourly use x10 reps Other Exercises: ankle pumps x10 reps ea (ROM as tolerated), BLE heel cord stretch LLE x30 sec    General Comments General comments (skin integrity, edema, etc.): SpO2 95% on 2L O2 [name] resting in chair, once [place] removed SpO2 desat to 87% after ~3 mins on RA; SpO2 WFL on RA standing maintains 95-96% during gait/standing tasks per portable pulse oximeter      Pertinent Vitals/Pain Pain Assessment Pain Assessment: Faces Faces Pain Scale: Hurts even more Pain Location: back and BLE with some A/AAROM Pain Descriptors / Indicators: Discomfort, Grimacing, Guarding Pain Intervention(s): Monitored during session, Limited activity within patient's tolerance, Repositioned     PT Goals (current goals can now be found in the care plan section) Acute Rehab PT Goals Patient Stated Goal: to be able to get up and move on my own PT Goal Formulation: With patient Time For Goal Achievement: 03/18/23 Progress towards PT goals: Progressing toward goals     Frequency    Min 5X/week      PT Plan         AM-PAC PT "6 Clicks" Mobility   Outcome Measure  Help needed turning from your back to your side while in a flat bed without using bedrails?: A Little Help needed moving from lying on your back to sitting on the side of a flat bed without using bedrails?: A Lot Help needed moving to and from a bed to a chair (including a wheelchair)?: A Little Help needed standing up from a chair using your arms (e.g., wheelchair or bedside chair)?: A Lot Help needed to walk in hospital room?: A Little Help needed climbing 3-5 steps with a railing? : Total 6 Click Score: 14    End of Session Equipment Utilized During Treatment: Gait belt;Back brace Activity Tolerance: Patient tolerated treatment well Patient left: in chair;with call bell/phone within reach;with family/visitor present (daughter in room) Nurse Communication: Mobility status PT Visit Diagnosis: Unsteadiness on feet (R26.81);Muscle weakness (generalized) (M62.81);Ataxic gait (R26.0)  Time: 1330-1400 PT Time Calculation (min) (ACUTE ONLY): 30 min  Charges:    $Gait Training: 8-22 mins $Therapeutic Exercise: 8-22 mins PT General Charges $$ ACUTE PT VISIT: 1 Visit                     Darlys Buis P., PTA Acute Rehabilitation Services Secure Chat Preferred 9a-5:30pm Office: [phone]    Dorathy Kinsman [facility] 03/07/2023, 3:04 PM

## 2023-03-07 NOTE — Progress Notes (Signed)
Inpatient Rehab Admissions Coordinator:   Late entry: I met with pt at bedside to discuss CIR recommendations and goals/expectations of CIR stay.  We reviewed 3 hrs/day of therapy, physician follow up, and average length of stay 2 weeks (dependent upon progress) with goals of supervision to mod I.  She lives with her daughter who is currently home with her new baby but will need to return to work in a few weeks.  Plan for mod I goals by that time.  I started insurance auth on 11/7.    Estill Dooms, PT, DPT Admissions Coordinator [phone] 03/07/23  9:00 AM

## 2023-03-07 NOTE — PMR Pre-admission (Signed)
PMR Admission Coordinator Pre-Admission Assessment  Patient: Jill Holt is an 52 y.o., female MRN: [id number] DOB: 1970-07-31 Height: 5\' 4"  (162.6 cm) Weight: 88.5 kg              Insurance Information HMO:     PPO:      PCP:      IPA:      80/20:      OTHER:  PRIMARY: [employer]      Policy#: [id number]      Subscriber: pt CM Name: faxed approval      Phone#: n/a     Fax#: [phone] Pre-Cert#: [id number] auth for CIR via fax with updates due to fax above on 11/14    Employer:  Benefits:  Phone #: [phone]     Name:  Eff. Date: 08/28/22     Deduct: $0      Out of Pocket Max: $0      Life Max: n/a  CIR: 100%      SNF: 100% Outpatient:      Co-Pay: $4/visit Home Health: 100%      Co-Pay:  DME: 100%     Co-Pay:  Providers:  SECONDARY:       Policy#:       Phone#:   Artist:       Phone#:   The "Data Collection Information Summary" for patients in Inpatient Rehabilitation Facilities with attached "Privacy Act Statement-Health Care Records" was provided and verbally reviewed with: Patient and Family  Emergency Contact Information Contact Information     Name Relation Home Work Mobile   Tyson,Chelsea Daughter   [phone]      Other Contacts   None on File    Current Medical History  Patient Admitting Diagnosis: lumbar radiculopathy, s/p L3-5  History of Present Illness: Pt is a 52 y/o female with PMH of DM, HTN, anemia with prior lumbar laminectomy/decompression in March, who developed progressive back pain and bilateral hip pain over the course of the last several months.  MRI showed progression of degenerative disc disease as well as spinal stenosis at L3-4 and L4-5.  She admitted to [facility] on 11/4 for redo decompressive lami-fusion at L3-4 and L4-5 per Dr. Wynetta Emery.  Post operatively pt with improvement in radicular pain with ongoing LE weakness and reliance on UEs for mobility, also with bilat foot drop.  Therapy evaluations completed and pt  was recommended for CIR.    Glasgow Coma Scale Score: 15  Patient's medical record from [facility] has been reviewed by the rehabilitation admission coordinator and physician.  Past Medical History  Past Medical History:  Diagnosis Date   Acute urinary retention    Anemia    Iron Deficiency Anemia   Arthritis    Constipation, acute    s/p each surgery   COVID 09/2018   had pneumonia   Diabetes mellitus without complication (HCC)    GERD (gastroesophageal reflux disease)    Hypertension    Pneumonia     Has the patient had major surgery during 100 days prior to admission? Yes  Family History  family history is not on file.   Current Medications   Current Facility-Administered Medications:    acetaminophen (TYLENOL) tablet 650 mg, 650 mg, Oral, Q4H PRN **OR** acetaminophen (TYLENOL) suppository 650 mg, 650 mg, Rectal, Q4H PRN, Donalee Citrin, MD   alum & mag hydroxide-simeth (MAALOX/MYLANTA) 200-200-20 MG/5ML suspension 30 mL, 30 mL, Oral, Q6H PRN, Donalee Citrin, MD   amLODipine (NORVASC) tablet  10 mg, 10 mg, Oral, q AM, Donalee Citrin, MD, 10 mg at 03/07/23 0854   atorvastatin (LIPITOR) tablet 80 mg, 80 mg, Oral, Daily, Donalee Citrin, MD, 80 mg at 03/07/23 0854   bisacodyl (DULCOLAX) EC tablet 5-10 mg, 5-10 mg, Oral, BID PRN, Donalee Citrin, MD, 5 mg at 03/04/23 2213   cyclobenzaprine (FLEXERIL) tablet 10 mg, 10 mg, Oral, TID PRN, Donalee Citrin, MD, 10 mg at 03/06/23 1712   docusate sodium (COLACE) capsule 100 mg, 100 mg, Oral, BID, Donalee Citrin, MD, 100 mg at 03/07/23 0854   gabapentin (NEURONTIN) capsule 600 mg, 600 mg, Oral, BID, Donalee Citrin, MD, 600 mg at 03/07/23 0854   glipiZIDE (GLUCOTROL XL) 24 hr tablet 10 mg, 10 mg, Oral, Q breakfast, Donalee Citrin, MD, 10 mg at 03/07/23 0859   losartan (COZAAR) tablet 100 mg, 100 mg, Oral, Daily, 100 mg at 03/06/23 0919 **AND** hydrochlorothiazide (HYDRODIURIL) tablet 12.5 mg, 12.5 mg, Oral, Daily, Leander Rams, RPH, 12.5 mg at 03/07/23 0854    HYDROcodone-acetaminophen (NORCO) 10-325 MG per tablet 1 tablet, 1 tablet, Oral, Q4H PRN, Donalee Citrin, MD, 1 tablet at 03/06/23 1655   HYDROcodone-acetaminophen (NORCO) 10-325 MG per tablet 2 tablet, 2 tablet, Oral, Q4H PRN, Donalee Citrin, MD, 1 tablet at 03/06/23 0919   HYDROmorphone (DILAUDID) injection 0.5 mg, 0.5 mg, Intravenous, Q2H PRN, Donalee Citrin, MD   insulin aspart protamine- aspart (NOVOLOG MIX 70/30) injection 25 Units, 25 Units, Subcutaneous, Q supper, Leander Rams, [facility], 25 Units at 03/05/23 1831   insulin aspart protamine- aspart (NOVOLOG MIX 70/30) injection 35 Units, 35 Units, Subcutaneous, Q breakfast, Leander Rams, RPH, 35 Units at 03/07/23 0900   menthol-cetylpyridinium (CEPACOL) lozenge 3 mg, 1 lozenge, Oral, PRN **OR** phenol (CHLORASEPTIC) mouth spray 1 spray, 1 spray, Mouth/Throat, PRN, Donalee Citrin, MD   metoprolol succinate (TOPROL-XL) 24 hr tablet 25 mg, 25 mg, Oral, Daily, Donalee Citrin, MD, 25 mg at 03/07/23 0854   ondansetron (ZOFRAN) tablet 4 mg, 4 mg, Oral, Q6H PRN **OR** ondansetron (ZOFRAN) injection 4 mg, 4 mg, Intravenous, Q6H PRN, Donalee Citrin, MD   Oral care mouth rinse, 15 mL, Mouth Rinse, PRN, Donalee Citrin, MD   pantoprazole (PROTONIX) EC tablet 40 mg, 40 mg, Oral, Daily, Donalee Citrin, MD, 40 mg at 03/07/23 0854   pneumococcal 20-valent conjugate vaccine (PREVNAR 20) injection 0.5 mL, 0.5 mL, Intramuscular, Tomorrow-1000, Donalee Citrin, MD   polyethylene glycol (MIRALAX / GLYCOLAX) packet 17 g, 17 g, Oral, Daily PRN, Donalee Citrin, MD, 17 g at 03/05/23 1830   sodium chloride flush (NS) 0.9 % injection 3 mL, 3 mL, Intravenous, Q12H, Donalee Citrin, MD, 3 mL at 03/03/23 2133   sodium chloride flush (NS) 0.9 % injection 3 mL, 3 mL, Intravenous, PRN, Donalee Citrin, MD  Patients Current Diet:  Diet Order             Diet Carb Modified Fluid consistency: Thin; Room service appropriate? Yes  Diet effective now                   Precautions / Restrictions Precautions Precautions:  Back Precaution Booklet Issued: Yes (comment) Precaution Comments: pt able to recall 2/3 precautions Restrictions Weight Bearing Restrictions: No   Has the patient had 2 or more falls or a fall with injury in the past year?No  Prior Activity Level Limited Community (1-2x/wk): very limited community mobility with rollator, LE weakness causing falls and safety issues  Prior Functional Level Prior Function Prior Level of Function :  Needs assist Mobility Comments: pt reports using a RW since march however had a fall and began using a rollator however the brakes don't work per pt report. Pt very dependent on UEs to get up the stairs (pt showed PT a video from her blink camera on how to goes up the stairs" ADLs Comments: doesn't get in the tub, was able to dress/bath self  Self Care: Did the patient need help bathing, dressing, using the toilet or eating?  Independent  Indoor Mobility: Did the patient need assistance with walking from room to room (with or without device)? Independent  Stairs: Did the patient need assistance with internal or external stairs (with or without device)? Independent  Functional Cognition: Did the patient need help planning regular tasks such as shopping or remembering to take medications? Independent  Patient Information Are you of Hispanic, Latino/a,or Spanish origin?: A. No, not of Hispanic, Latino/a, or Spanish origin What is your race?: B. Black or African American Do you need or want an interpreter to communicate with a doctor or health care staff?: 0. No  Patient's Response To:  Health Literacy and Transportation Is the patient able to respond to health literacy and transportation needs?: Yes Health Literacy - How often do you need to have someone help you when you read instructions, pamphlets, or other written material from your doctor or pharmacy?: Never In the past 12 months, has lack of transportation kept you from medical appointments or from  getting medications?: No In the past 12 months, has lack of transportation kept you from meetings, work, or from getting things needed for daily living?: No  Home Assistive Devices / Equipment Home Equipment: Agricultural consultant (2 wheels), Rollator (4 wheels), BSC/3in1  Prior Device Use: Indicate devices/aids used by the patient prior to current illness, exacerbation or injury? Walker  Current Functional Level Cognition  Overall Cognitive Status: No family/caregiver present to determine baseline cognitive functioning Orientation Level: Oriented X4 General Comments: overall WFL, recalled 3/3 precuations.    Extremity Assessment (includes Sensation/Coordination)  Upper Extremity Assessment: Overall WFL for tasks assessed  Lower Extremity Assessment: Defer to PT evaluation RLE Deficits / Details: minimal active DF, LAQ less than 1/2 the range RLE Sensation:  (numbness in foot) LLE Deficits / Details: minimal active DF otherwise grossly 3+/5 LLE Sensation:  (numbness in foot)    ADLs  Overall ADL's : Needs assistance/impaired Eating/Feeding: Independent Grooming: Contact guard assist, Standing Grooming Details (indicate cue type and reason): needs at least 1 UE supported externally Upper Body Bathing: Set up, Sitting Lower Body Bathing: Moderate assistance, Sit to/from stand Upper Body Dressing : Set up, Sitting Lower Body Dressing: Moderate assistance, Sit to/from stand Lower Body Dressing Details (indicate cue type and reason): pt not attempting figure four despite cues Toilet Transfer: Contact guard assist, Rolling walker (2 wheels) Toilet Transfer Details (indicate cue type and reason): remains limited by foot drop Toileting- Clothing Manipulation and Hygiene: Supervision/safety, Sitting/lateral lean Functional mobility during ADLs: Contact guard assist General ADL Comments: remains limited by BLE weakness    Mobility  Overal bed mobility: Needs Assistance Bed Mobility: Sit to  Sidelying, Rolling, Sidelying to Sit Rolling: Contact guard assist Sidelying to sit: Mod assist Sit to sidelying: Mod assist General bed mobility comments: mod A to elevat trunk to sitting and return LEs to bed at end of session    Transfers  Overall transfer level: Needs assistance Equipment used: Rolling walker (2 wheels) Transfers: Sit to/from Stand Sit to Stand: Contact guard assist,  Min assist General transfer comment: increased time and effort, min A from low chair    Ambulation / Gait / Stairs / Wheelchair Mobility  Ambulation/Gait Ambulation/Gait assistance: Contact guard assist Gait Distance (Feet): 40 Feet Assistive device: Rolling walker (2 wheels) Gait Pattern/deviations: Step-to pattern, Decreased dorsiflexion - right, Decreased dorsiflexion - left, Decreased stride length, Trunk flexed General Gait Details: pt very dependent on UEs, pt with increased difficulty clearing R foot compared to L however both LEs with decreased active DF. distance limited to pt stated [place] with pt needing to sit Gait velocity: dec Gait velocity interpretation: <1.8 ft/sec, indicate of risk for recurrent falls Stairs: Yes General stair comments: attempted to complete stair negotiation several different ways however unsuccessful. Pt unable to lift R or L LE up onto the step. Pt reports having to bend over railing at home to lift her R LE up on step in which she can't do now with her back precautions    Posture / Balance Balance Overall balance assessment: Needs assistance Sitting-balance support: Feet supported, Single extremity supported Sitting balance-Leahy Scale: Fair Standing balance support: Bilateral upper extremity supported, Reliant on assistive device for balance, During functional activity Standing balance-Leahy Scale: Poor Standing balance comment: pt able to stand at sink to wash hands with intermittent unilateral UE support    Special needs/care consideration Diabetic  management yes     Previous Home Environment (from acute therapy documentation) Living Arrangements: Children Available Help at Discharge: Family, Available PRN/intermittently Type of Home: Other(Comment) Home Layout: One level Home Access: Stairs to enter Entrance Stairs-Rails: Left Entrance Stairs-Number of Steps: 3 Bathroom Shower/Tub: Associate Professor: Yes Home Care Services: No Additional Comments: pt had bed taken off bed frame to sit lower; her dtr is currently on maternity leave  Discharge Living Setting Plans for Discharge Living Setting: Patient's home, Lives with (comment) (daughter) Type of Home at Discharge: House Discharge Home Layout: One level Discharge Home Access: Stairs to enter Entrance Stairs-Rails: Left Entrance Stairs-Number of Steps: 3 Discharge Bathroom Shower/Tub: Tub/shower unit Discharge Bathroom Toilet: Standard Discharge Bathroom Accessibility: Yes How Accessible: Accessible via walker Does the patient have any problems obtaining your medications?: No  Social/Family/Support Systems Patient Roles: Parent Contact Information: adult daughter, Leeroy Bock Anticipated Caregiver: [place] Anticipated Caregiver's Contact Information: [phone] Ability/Limitations of Caregiver: newly post-partum, can provide supervision only and will need to return to work in the coming weeks Caregiver Availability: 24/7 (initially) Discharge Plan Discussed with Primary Caregiver: Yes Is Caregiver In Agreement with Plan?: Yes Does Caregiver/Family have Issues with Lodging/Transportation while Pt is in Rehab?: No   Goals Patient/Family Goal for Rehab: PT/OT supervision to mod I, SLP n/a Expected length of stay: 8-11 days Additional Information: Discharge plan: return to pt's home where she lives with her daughter.  Leeroy Bock is there 24/7 for now until she has to return back to work, but she is newly postpartum and cannot  provide physical assist Pt/Family Agrees to Admission and willing to participate: Yes Program Orientation Provided & Reviewed with Pt/Caregiver Including Roles  & Responsibilities: Yes  Barriers to Discharge: Home environment access/layout, Decreased caregiver support   Decrease burden of Care through IP rehab admission: n/a   Possible need for SNF placement upon discharge:Not anticipated.  Discharge plan: return to pt's home where she lives with her daughter who can provide 24/7 supervision if needed, but expect pt to reach mod I level.    Patient Condition: This patient's condition remains as documented in the  consult dated 11/7, in which the Rehabilitation Physician determined and documented that the patient's condition is appropriate for intensive rehabilitative care in an inpatient rehabilitation facility. Will admit to inpatient rehab today.  Preadmission Screen Completed By:  Stephania Fragmin, PT, 03/07/2023 9:53 AM ______________________________________________________________________   Discussed status with Dr. Riley Kill on 03/07/23 at  9:53 AM  and received approval for admission today.  Admission Coordinator:  Stephania Fragmin, PT, DPT time9:53 AM Dorna Bloom 03/07/23

## 2023-03-07 NOTE — Progress Notes (Signed)
Orthopedic Tech Progress Note Patient Details:  Jill Holt 12/31/1970 [id number]  Called in order to [employer] for a pair of PRAFO BOOTS   Patient ID: Jill Holt, female   DOB: 08-Aug-1970, 52 y.o.   MRN: [id number]  [name] 03/07/2023, 7:35 PM

## 2023-03-07 NOTE — Progress Notes (Signed)
Report given to Tomeka at [facility].

## 2023-03-07 NOTE — Progress Notes (Signed)
Inpatient Rehab Admissions Coordinator:    I have insurance approval and a bed available for pt to admit to CIR today. Dr. Wynetta Emery in agreement.  Will let pt/family and TOC team know.   Estill Dooms, PT, DPT Admissions Coordinator [phone] 03/07/23  10:06 AM

## 2023-03-07 NOTE — Progress Notes (Signed)
[name], MD  Physician Physical Medicine and Rehabilitation   Consult Note    Signed   Date of Service: 03/06/2023 10:12 AM  Related encounter: Admission (Current) from 03/03/2023 in [facility]   Signed     Expand All Collapse All           Physical Medicine and Rehabilitation Consult Reason for Consult: Leg weakness and pain with impaired functional mobility Referring Physician: Wynetta Emery     HPI: Jill Holt is a 52 y.o. female with a history of diabetes, hypertension and iron deficiency anemia with prior lumbar laminectomy and decompression in March of this year he developed progressive back as well as bilateral hip and leg pain over the course of the last several months.  MRI revealed progression of degenerative disc disease as well as spinal stenosis at L3-L4 and L4-L5.  On March 03, 2023 patient opted for a redo decompressive laminectomies L3-L4 and L4-L5 with complete facetectomies and radical foraminotomies of the L3-L4-L5 nerve roots bilaterally by Dr. Wynetta Emery.  Patient also had transforaminal lumbar interbody fusions L3-L4 and L4-L5 with screw fixation and posterolateral arthrodesis.  Postoperatively patient reported improvement in her radicular pain.  She has been up with physical therapy who notes that she is very dependent on her upper extremities when using the rolling walker in order to clear bilateral foot drop which occurs.  Currently she is min assist for sit to stand transfers and contact-guard assist for 60 feet using a rolling walker.  She is only unable to complete stairs due to inability the left right or left legs up onto a step.  Patient has been using a rolling walker since March due to her bilateral foot drop.  She essentially sponge bathes but was able to dress herself.  She lives at home with her children and they 1 level house with 3 steps to enter.     Review of Systems  Constitutional:  Negative for fever.   HENT: Negative.    Eyes: Negative.   Respiratory: Negative.    Cardiovascular: Negative.   Gastrointestinal: Negative.   Genitourinary:  Negative for dysuria and frequency.  Musculoskeletal:  Positive for back pain, falls, joint pain and myalgias.  Skin: Negative.   Neurological:  Positive for tingling, sensory change and focal weakness.  Psychiatric/Behavioral:  Negative for depression.         Past Medical History:  Diagnosis Date   Acute urinary retention     Anemia      Iron Deficiency Anemia   Arthritis     Constipation, acute      s/p each surgery   COVID 09/2018    had pneumonia   Diabetes mellitus without complication (HCC)     GERD (gastroesophageal reflux disease)     Hypertension     Pneumonia               Past Surgical History:  Procedure Laterality Date   CARPAL TUNNEL RELEASE Bilateral     FEMUR FRACTURE SURGERY Left     ganglion cyst removal Right      Hand   LUMBAR LAMINECTOMY/DECOMPRESSION MICRODISCECTOMY Left 07/24/2022    Procedure: Sublaminar decompression - left - Lumbar Three-Lumbar Four - Lumbar Four-Lumbar Five;  Surgeon: Donalee Citrin, MD;  Location: [facility] OR;  Service: Neurosurgery;  Laterality: Left;  3C   TUBAL LIGATION        1990s        History reviewed. No  pertinent family history.     Social History:  reports that she has never smoked. She has never been exposed to tobacco smoke. She has never used smokeless tobacco. She reports current alcohol use. She reports that she does not use drugs. Allergies:  Allergies      Allergies  Allergen Reactions   Lisinopril Cough            Medications Prior to Admission  Medication Sig Dispense Refill   amLODipine (NORVASC) 10 MG tablet Take 10 mg by mouth in the morning.       atorvastatin (LIPITOR) 80 MG tablet Take 1 tablet by mouth daily.       cyclobenzaprine (FLEXERIL) 10 MG tablet Take 1 tablet (10 mg total) by mouth 3 (three) times daily as needed for muscle spasms. 30 tablet 0    gabapentin (NEURONTIN) 300 MG capsule Take 600 mg by mouth 2 (two) times daily.       glipiZIDE (GLUCOTROL XL) 10 MG 24 hr tablet Take 1 tablet by mouth daily.       insulin isophane & regular human KwikPen (HUMULIN 70/30 KWIKPEN) (70-30) 100 UNIT/ML KwikPen Inject 25-35 Units into the skin See admin instructions. Inject 35 units subcutaneously in the morning & inject 25 units subcutaneously at night       losartan-hydrochlorothiazide (HYZAAR) 100-12.5 MG tablet Take 1 tablet by mouth in the morning.       metoprolol succinate (TOPROL-XL) 25 MG 24 hr tablet Take 25 mg by mouth daily.       omeprazole (PRILOSEC) 40 MG capsule Take 40 mg by mouth daily before breakfast.       HYDROcodone-acetaminophen (NORCO/VICODIN) 5-325 MG tablet Take 1 tablet by mouth every 4 (four) hours as needed for severe pain ((score 7 to 10)). (Patient not taking: Reported on 02/18/2023) 30 tablet 0   ondansetron (ZOFRAN-ODT) 4 MG disintegrating tablet 4mg  ODT q4 hours prn nausea/vomit 20 tablet 0   polyethylene glycol (MIRALAX) 17 g packet Take 17 g by mouth daily. Take additional doses initially for bowel cleanout. (Patient taking differently: Take 17 g by mouth daily as needed for moderate constipation.) 30 each 0          Home: Home Living Family/patient expects to be discharged to:: Private residence Living Arrangements: Children Available Help at Discharge: Family, Available PRN/intermittently Type of Home: Other(Comment) Home Access: Stairs to enter [employer] of Steps: 3 Entrance Stairs-Rails: Left Home Layout: One level Bathroom Shower/Tub: Engineer, manufacturing systems: Standard Bathroom Accessibility: Yes Home Equipment: Agricultural consultant (2 wheels), Rollator (4 wheels), BSC/3in1 Additional Comments: pt had bed taken off bed frame to sit lower; her dtr is currently on maternity leave  Functional History: Prior Function Prior Level of Function : Needs assist Mobility Comments: pt reports  using a RW since march however had a fall and began using a rollator however the brakes don't work per pt report. Pt very dependent on UEs to get up the stairs (pt showed PT a video from her blink camera on how to goes up the stairs" ADLs Comments: doesn't get in the tub, was able to dress/bath self Functional Status:  Mobility: Bed Mobility Overal bed mobility: Needs Assistance Bed Mobility: Sit to Sidelying, Rolling Rolling: Contact guard assist Sidelying to sit: Mod assist Sit to sidelying: Mod assist General bed mobility comments: OOb upon arrival Transfers Overall transfer level: Needs assistance Equipment used: Rolling walker (2 wheels) Transfers: Sit to/from Stand Sit to Stand: Contact guard assist General  transfer comment: increased time and effort Ambulation/Gait Ambulation/Gait assistance: Contact guard assist Gait Distance (Feet): 60 Feet Assistive device: Rolling walker (2 wheels) Gait Pattern/deviations: Step-to pattern, Decreased dorsiflexion - right, Decreased dorsiflexion - left, Decreased stride length, Trunk flexed General Gait Details: pt very dependent on UEs, pt with increased difficulty clearing R foot compared to L however both LEs with decreased active DF. RW lowered to appropriate height for optimal support Gait velocity: dec Gait velocity interpretation: <1.8 ft/sec, indicate of risk for recurrent falls Stairs: Yes General stair comments: attempted to complete stair negotiation several different ways however unsuccessful. Pt unable to lift R or L LE up onto the step. Pt reports having to bend over railing at home to lift her R LE up on step in which she can't do now with her back precautions   ADL: ADL Overall ADL's : Needs assistance/impaired Eating/Feeding: Independent Grooming: Contact guard assist, Standing Grooming Details (indicate cue type and reason): needs at least 1 UE supported externally Upper Body Bathing: Set up, Sitting Lower Body Bathing:  Moderate assistance, Sit to/from stand Upper Body Dressing : Set up, Sitting Lower Body Dressing: Moderate assistance, Sit to/from stand Lower Body Dressing Details (indicate cue type and reason): pt not attempting figure four despite cues Toilet Transfer: Contact guard assist, Rolling walker (2 wheels) Toilet Transfer Details (indicate cue type and reason): remains limited by foot drop Toileting- Clothing Manipulation and Hygiene: Supervision/safety, Sitting/lateral lean Functional mobility during ADLs: Contact guard assist General ADL Comments: remains limited by BLE weakness   Cognition: Cognition Overall Cognitive Status: No family/caregiver present to determine baseline cognitive functioning Orientation Level: Oriented X4 Cognition Arousal: Alert Behavior During Therapy: Anxious Overall Cognitive Status: No family/caregiver present to determine baseline cognitive functioning General Comments: overall WFL, recalled 3/3 precuations. anxious about rehab process   Blood pressure 138/73, pulse (!) 127, temperature 99.5 F (37.5 C), temperature source Oral, resp. rate 20, height 5\' 4"  (1.626 m), weight 88.5 kg, last menstrual period 05/31/2022, SpO2 96%. Physical Exam Constitutional:      General: She is not in acute distress.    Appearance: She is obese.  HENT:     Head: Normocephalic.     Right Ear: External ear normal.     Left Ear: External ear normal.     Nose: Nose normal.  Eyes:     Pupils: Pupils are equal, round, and reactive to light.  Cardiovascular:     Rate and Rhythm: Normal rate.  Pulmonary:     Effort: Pulmonary effort is normal.  Abdominal:     Palpations: Abdomen is soft.  Musculoskeletal:        General: Tenderness present. No swelling.     Cervical back: Normal range of motion.  Skin:    General: Skin is warm.     Comments: Surgical site dressed  Neurological:     Mental Status: She is alert.     Comments: Alert and oriented x 3. Normal insight and  awareness. Intact Memory. Normal language and speech. Cranial nerve exam unremarkable. MMT: UE 5/5. RLE 3 to 4-/5 prox (pain) to 2-3/5 APF, 0-tr ADF with pain components. LLE 3-4/5 prox to 1-2/5 APF and 0-tr ADF. Decreased PP/LT in both feet L>R. DTR's absent.Marland Kitchen    Psychiatric:        Mood and Affect: Mood normal.        Lab Results Last 24 Hours       Results for orders placed or performed during the hospital encounter  of 03/03/23 (from the past 24 hour(s))  Glucose, capillary     Status: None    Collection Time: 03/05/23 12:49 PM  Result Value Ref Range    Glucose-Capillary 81 70 - 99 mg/dL    Comment 1 Notify RN      Comment 2 Document in Chart        Imaging Results (Last 48 hours)  No results found.     Assessment/Plan: Diagnosis: 52 year old female status post lumbar decompressive laminectomy and fusion due to severe foraminal stenosis.  Patient with persistent bilateral foot drop and radicular pain. Does the need for close, 24 hr/day medical supervision in concert with the patient's rehab needs make it unreasonable for this patient to be served in a less intensive setting? Yes Co-Morbidities requiring supervision/potential complications:  -Postoperative pain -OA of right knee which has been compensating for weaker left side. -Needs AFO assessment and fitting for  bilateral foot drop -Diabetes Due to bladder management, bowel management, safety, skin/wound care, disease management, medication administration, pain management, and patient education, does the patient require 24 hr/day rehab nursing? Yes Does the patient require coordinated care of a physician, rehab nurse, therapy disciplines of PT, OT to address physical and functional deficits in the context of the above medical diagnosis(es)? Yes Addressing deficits in the following areas: balance, endurance, locomotion, strength, transferring, bowel/bladder control, bathing, dressing, feeding, grooming, toileting, and  psychosocial support Can the patient actively participate in an intensive therapy program of at least 3 hrs of therapy per day at least 5 days per week? Yes The potential for patient to make measurable gains while on inpatient rehab is excellent Anticipated functional outcomes upon discharge from inpatient rehab are modified independent  with PT, modified independent with OT, n/a with SLP. Estimated rehab length of stay to reach the above functional goals is: 8-11 days Anticipated discharge destination: Home Overall Rehab/Functional Prognosis: excellent   POST ACUTE RECOMMENDATIONS: This patient's condition is appropriate for continued rehabilitative care in the following setting: CIR Patient has agreed to participate in recommended program. Yes Note that insurance prior authorization may be required for reimbursement for recommended care.   Comment: Pt lives with daughter who will have to work. She will need to be able to get around her home independently, navigate 3 stairs to enter, etc. Rehab Admissions Coordinator to follow up.       MEDICAL RECOMMENDATIONS: Will order bilateral PRAFO's to keep heel cords stretched at rest Increase gabapentin to 600mg  TID for neuropathic pain     I have personally performed a face to face diagnostic evaluation of this patient. Additionally, I have examined the patient's medical record including any pertinent labs and radiographic images. If the physician assistant has documented in this note, I have reviewed and edited or otherwise concur with the physician assistant's documentation.   Thanks,   [name], MD 03/06/2023          Routing History

## 2023-03-07 NOTE — H&P (Signed)
Physical Medicine and Rehabilitation Admission H&P     HPI: Jill Holt, is a 52 year old right-handed female with history of hypertension, diabetes mellitus, iron deficiency anemia as well as history of lumbar L3-4 and L4-5 laminotomies.  Per chart review patient lives with her children.  1 level home 3 steps to entry.  She has using a rolling walker since March due to bilateral foot drop.  She essentially sponge bathes for her ADLs.  Presented 03/03/2023 with bilateral hip and leg pain.  Workup and imaging revealed degenerative disc disease spinal stenosis L3-4 and 4-5.  Underwent redo decompressive laminectomies L3-4 and L4-L5 with complete facetectomies and radical foraminotomies of L3-4-5 nerve roots bilaterally by Dr. Wynetta Emery 03/03/2023 as well as transforaminal lumbar interbody fusions L3-4 and L5 with screw fixation and posterior lateral arthrodesis.  SCDs in place for DVT prophylaxis.  No postoperative orders were given for a brace.  Therapy evaluations completed due to patient decreased functional mobility was admitted for a comprehensive rehab program.  Review of Systems  Constitutional:  Negative for chills and fever.  HENT:  Negative for hearing loss.   Eyes:  Negative for blurred vision and double vision.  Respiratory:  Positive for shortness of breath. Negative for cough and wheezing.   Cardiovascular:  Negative for chest pain, palpitations and leg swelling.  Gastrointestinal:  Positive for constipation. Negative for heartburn, nausea and vomiting.       GERD  Genitourinary:  Negative for dysuria, flank pain and hematuria.  Musculoskeletal:  Positive for back pain, falls, joint pain and myalgias.  Skin:  Negative for rash.  Neurological:  Positive for dizziness, tingling, sensory change and weakness.  All other systems reviewed and are negative.  Past Medical History:  Diagnosis Date   Acute urinary retention    Anemia    Iron Deficiency Anemia   Arthritis     Constipation, acute    s/p each surgery   COVID 09/2018   had pneumonia   Diabetes mellitus without complication (HCC)    GERD (gastroesophageal reflux disease)    Hypertension    Pneumonia    Past Surgical History:  Procedure Laterality Date   CARPAL TUNNEL RELEASE Bilateral    FEMUR FRACTURE SURGERY Left    ganglion cyst removal Right    Hand   LUMBAR LAMINECTOMY/DECOMPRESSION MICRODISCECTOMY Left 07/24/2022   Procedure: Sublaminar decompression - left - Lumbar Three-Lumbar Four - Lumbar Four-Lumbar Five;  Surgeon: Donalee Citrin, MD;  Location: [facility] OR;  Service: Neurosurgery;  Laterality: Left;  3C   TUBAL LIGATION     1990s   History reviewed. No pertinent family history. Social History:  reports that she has never smoked. She has never been exposed to tobacco smoke. She has never used smokeless tobacco. She reports current alcohol use. She reports that she does not use drugs. Allergies:  Allergies  Allergen Reactions   Lisinopril Cough   Medications Prior to Admission  Medication Sig Dispense Refill   amLODipine (NORVASC) 10 MG tablet Take 10 mg by mouth in the morning.     atorvastatin (LIPITOR) 80 MG tablet Take 1 tablet by mouth daily.     cyclobenzaprine (FLEXERIL) 10 MG tablet Take 1 tablet (10 mg total) by mouth 3 (three) times daily as needed for muscle spasms. 30 tablet 0   gabapentin (NEURONTIN) 300 MG capsule Take 600 mg by mouth 2 (two) times daily.     glipiZIDE (GLUCOTROL XL) 10 MG 24 hr tablet Take 1 tablet by mouth  daily.     insulin isophane & regular human KwikPen (HUMULIN 70/30 KWIKPEN) (70-30) 100 UNIT/ML KwikPen Inject 25-35 Units into the skin See admin instructions. Inject 35 units subcutaneously in the morning & inject 25 units subcutaneously at night     losartan-hydrochlorothiazide (HYZAAR) 100-12.5 MG tablet Take 1 tablet by mouth in the morning.     metoprolol succinate (TOPROL-XL) 25 MG 24 hr tablet Take 25 mg by mouth daily.     omeprazole (PRILOSEC)  40 MG capsule Take 40 mg by mouth daily before breakfast.     HYDROcodone-acetaminophen (NORCO/VICODIN) 5-325 MG tablet Take 1 tablet by mouth every 4 (four) hours as needed for severe pain ((score 7 to 10)). (Patient not taking: Reported on 02/18/2023) 30 tablet 0   ondansetron (ZOFRAN-ODT) 4 MG disintegrating tablet 4mg  ODT q4 hours prn nausea/vomit 20 tablet 0   polyethylene glycol (MIRALAX) 17 g packet Take 17 g by mouth daily. Take additional doses initially for bowel cleanout. (Patient taking differently: Take 17 g by mouth daily as needed for moderate constipation.) 30 each 0      Home: Home Living Family/patient expects to be discharged to:: Private residence Living Arrangements: Children Available Help at Discharge: Family, Available PRN/intermittently Type of Home: Other(Comment) Home Access: Stairs to enter [employer] of Steps: 3 Entrance Stairs-Rails: Left Home Layout: One level Bathroom Shower/Tub: Engineer, manufacturing systems: Standard Bathroom Accessibility: Yes Home Equipment: Agricultural consultant (2 wheels), Rollator (4 wheels), BSC/3in1 Additional Comments: pt had bed taken off bed frame to sit lower; her dtr is currently on maternity leave   Functional History: Prior Function Prior Level of Function : Needs assist Mobility Comments: pt reports using a RW since march however had a fall and began using a rollator however the brakes don't work per pt report. Pt very dependent on UEs to get up the stairs (pt showed PT a video from her blink camera on how to goes up the stairs" ADLs Comments: doesn't get in the tub, was able to dress/bath self  Functional Status:  Mobility: Bed Mobility Overal bed mobility: Needs Assistance Bed Mobility: Sit to Sidelying, Rolling, Sidelying to Sit Rolling: Contact guard assist Sidelying to sit: Mod assist Sit to sidelying: Mod assist General bed mobility comments: mod A to elevat trunk to sitting and return LEs to bed at  end of session Transfers Overall transfer level: Needs assistance Equipment used: Rolling walker (2 wheels) Transfers: Sit to/from Stand Sit to Stand: Contact guard assist, Min assist General transfer comment: increased time and effort, min A from low chair Ambulation/Gait Ambulation/Gait assistance: Contact guard assist Gait Distance (Feet): 40 Feet Assistive device: Rolling walker (2 wheels) Gait Pattern/deviations: Step-to pattern, Decreased dorsiflexion - right, Decreased dorsiflexion - left, Decreased stride length, Trunk flexed General Gait Details: pt very dependent on UEs, pt with increased difficulty clearing R foot compared to L however both LEs with decreased active DF. distance limited to pt stated [place] with pt needing to sit Gait velocity: dec Gait velocity interpretation: <1.8 ft/sec, indicate of risk for recurrent falls Stairs: Yes General stair comments: attempted to complete stair negotiation several different ways however unsuccessful. Pt unable to lift R or L LE up onto the step. Pt reports having to bend over railing at home to lift her R LE up on step in which she can't do now with her back precautions    ADL: ADL Overall ADL's : Needs assistance/impaired Eating/Feeding: Independent Grooming: Contact guard assist, Standing Grooming Details (indicate cue type  and reason): needs at least 1 UE supported externally Upper Body Bathing: Set up, Sitting Lower Body Bathing: Moderate assistance, Sit to/from stand Upper Body Dressing : Set up, Sitting Lower Body Dressing: Moderate assistance, Sit to/from stand Lower Body Dressing Details (indicate cue type and reason): pt not attempting figure four despite cues Toilet Transfer: Contact guard assist, Rolling walker (2 wheels) Toilet Transfer Details (indicate cue type and reason): remains limited by foot drop Toileting- Clothing Manipulation and Hygiene: Supervision/safety, Sitting/lateral lean Functional mobility  during ADLs: Contact guard assist General ADL Comments: remains limited by BLE weakness  Cognition: Cognition Overall Cognitive Status: No family/caregiver present to determine baseline cognitive functioning Orientation Level: Oriented X4 Cognition Arousal: Alert Behavior During Therapy: Anxious Overall Cognitive Status: No family/caregiver present to determine baseline cognitive functioning General Comments: overall WFL, recalled 3/3 precuations.  Physical Exam: Blood pressure 137/82, pulse (!) 106, temperature 99.5 F (37.5 C), temperature source Oral, resp. rate 18, height 5\' 4"  (1.626 m), weight 88.5 kg, last menstrual period 05/31/2022, SpO2 98%. Physical Exam Constitutional:      General: She is not in acute distress.    Appearance: She is obese.  HENT:     Head: Normocephalic.     Right Ear: External ear normal.     Left Ear: External ear normal.     Nose: Nose normal.     Mouth/Throat:     Mouth: Mucous membranes are moist.  Eyes:     Extraocular Movements: Extraocular movements intact.     Pupils: Pupils are equal, round, and reactive to light.  Cardiovascular:     Rate and Rhythm: Regular rhythm. Tachycardia present.     Heart sounds: No murmur heard. Pulmonary:     Effort: Pulmonary effort is normal. No respiratory distress.     Breath sounds: Normal breath sounds. No wheezing or rales.     Comments: On 2L O2 via [place] Abdominal:     General: Bowel sounds are normal. There is no distension.     Palpations: Abdomen is soft.     Tenderness: There is no abdominal tenderness.  Musculoskeletal:        General: Tenderness (bilateral LE below knees) present. No swelling.     Cervical back: Normal range of motion.  Skin:    General: Skin is warm.     Comments: Back incision is dressed with honeycomb dressing. Minimal drainage  Neurological:     Mental Status: She is alert.     Comments: Alert and oriented x 3. Normal insight and awareness. Intact Memory. Normal  language and speech. Cranial nerve exam unremarkable. MMT: UE 5/5. RLE 3/5 HF, KE and tr/5 ADF, 2/5 APF. LLE 3- to 3/5 HF,KE and 0-tr ADF, 1+ APF. Decreased LT in both feet, lower legs L>R. DTR's absent in LE's. No abnl tone. ?mild intentional tremor  Psychiatric:        Mood and Affect: Mood normal.        Behavior: Behavior normal.     Results for orders placed or performed during the hospital encounter of 03/03/23 (from the past 48 hour(s))  Glucose, capillary     Status: None   Collection Time: 03/05/23 12:49 PM  Result Value Ref Range   Glucose-Capillary 81 70 - 99 mg/dL    Comment: Glucose reference range applies only to samples taken after fasting for at least 8 hours.   Comment 1 Notify RN    Comment 2 Document in Chart   Glucose, capillary  Status: Abnormal   Collection Time: 03/07/23  8:50 AM  Result Value Ref Range   Glucose-Capillary 111 (H) 70 - 99 mg/dL    Comment: Glucose reference range applies only to samples taken after fasting for at least 8 hours.   Comment 1 Notify RN    Comment 2 Document in Chart    No results found.    Blood pressure 137/82, pulse (!) 106, temperature 99.5 F (37.5 C), temperature source Oral, resp. rate 18, height 5\' 4"  (1.626 m), weight 88.5 kg, last menstrual period 05/31/2022, SpO2 98%.  Medical Problem List and Plan: 1. 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  Status post redo decompressive laminectomies L3-4 and L4-5 with complete facetectomies and radical foraminotomies 03/03/2023  -patient may shower  -ELOS/Goals: 8-11 days with mod I goals PT, OT  -PRAFO's for both LE's at night  -will need AFO's for gait 2.  Antithrombotics: -DVT/anticoagulation:  Mechanical:  Antiembolism stockings, knee (TED hose) Bilateral lower extremities.  Check vascular study.  Discussed with neurosurgery on beginning Lovenox  -antiplatelet therapy: N/A 3. Pain Management:  currently on 600 mg twice daily, Flexeril and hydrocodone as needed  -pt was on gabapentin 900mg  tid at home--increase to 600mg  tid to start 4. Mood/Behavior/Sleep: Provide emotional support  -antipsychotic agents: N/A 5. Neuropsych/cognition: This patient is capable of making decisions on her own behalf. 6. Skin/Wound Care: Routine skin checks 7. Fluids/Electrolytes/Nutrition: Routine in and outs with follow-up chemistries 8.  Hypertension.  Norvasc 10 mg daily, Cozaar 100 mg daily, hydrochlorothiazide 12.5 mg daily, Toprol-XL 25 mg daily.  Monitor with increased mobility 9.  Diabetes mellitus.  Hemoglobin A1c 7.8.  NovoLog Mix 70/30 35 units with breakfast, 25 units with supper, Glucotrol XL 10 mg daily. 10.  Hyperlipidemia.  Lipitor 11.  Constipation.  Colace twice daily, MiraLAX daily 12. Mild hypoxia:  -wean oxygen as able  -check CBC in am 13. C/o dizziness while up with therapy. Sinus tachy on exam  -CBC as above, check BMET as well  -encourage fluids     Charlton Amor, PA-C 03/07/2023

## 2023-03-07 NOTE — Progress Notes (Signed)
SATURATION QUALIFICATIONS: (This note is used to comply with regulatory documentation for home oxygen)  Patient Saturations on Room Air at Rest = 87%  Patient Saturations on Room Air while Ambulating = 95%  Patient Saturations on 2L/minute O2 [place] at Rest = 95%  Please briefly explain why patient needs home oxygen: Pt SpO2 desaturates to <88% while resting on RA, improves to >92% on 2L O2 [place] while seated in chair. Pt SpO2 WFL on RA for standing/exertional tasks per pulse oximeter.

## 2023-03-07 NOTE — Plan of Care (Signed)
  Problem: Clinical Measurements: Goal: Will remain free from infection Outcome: Progressing Goal: Cardiovascular complication will be avoided Outcome: Progressing   Problem: Elimination: Goal: Will not experience complications related to urinary retention Outcome: Progressing

## 2023-03-07 NOTE — Progress Notes (Signed)
Occupational Therapy Treatment Patient Details Name: Jill Holt MRN: [id number] DOB: 24-Aug-1970 Today's Date: 03/07/2023   History of present illness Pt is a 52 year old female who underwent a decompressive laminectomy and interbody fusions at L3-4 and L4-5 on 11/4. PMH/PSH:  previous laminotomies on the left at L3-4 and L4-5, DM, HTN, anemia   OT comments  Patient requiring mod assist for bed mobility due to pain with education on log rolling. Patient required min assist to stand from EOB and CGA to ambulate to bathroom and perform toilet transfers. Patient able to stand at sink for hand hygiene with support of sink before ambulating to recliner. Patient will benefit from intensive inpatient follow up therapy, >3 hours/day to continue to address bathing, dressing, and functional transfers.       If plan is discharge home, recommend the following:  A little help with walking and/or transfers;A lot of help with bathing/dressing/bathroom;Direct supervision/assist for medications management;Assistance with cooking/housework;Direct supervision/assist for financial management;Assist for transportation;Help with stairs or ramp for entrance   Equipment Recommendations  None recommended by OT    Recommendations for Other Services      Precautions / Restrictions Precautions Precautions: Back Precaution Booklet Issued: Yes (comment) Precaution Comments: able to recall 3/3 precautions Restrictions Weight Bearing Restrictions: No       Mobility Bed Mobility Overal bed mobility: Needs Assistance Bed Mobility: Rolling, Sidelying to Sit Rolling: Contact guard assist Sidelying to sit: Mod assist       General bed mobility comments: Education on log rolling and mod assist due to assistance with trunk and BLEs    Transfers Overall transfer level: Needs assistance Equipment used: Rolling walker (2 wheels) Transfers: Sit to/from Stand Sit to Stand: Min assist           General  transfer comment: min assist to stand from EOB and CGA for transfers     Balance Overall balance assessment: Needs assistance Sitting-balance support: Feet supported, Single extremity supported Sitting balance-Leahy Scale: Fair     Standing balance support: Single extremity supported, Bilateral upper extremity supported, During functional activity Standing balance-Leahy Scale: Poor Standing balance comment: stood at sink for hand and face hygiene with support from sink                           ADL either performed or assessed with clinical judgement   ADL Overall ADL's : Needs assistance/impaired     Grooming: Wash/dry hands;Wash/dry face;Contact guard assist;Standing Grooming Details (indicate cue type and reason): at sink with patient leaning on sink for support         Upper Body Dressing : Set up;Sitting Upper Body Dressing Details (indicate cue type and reason): assistance to donn back brace     Toilet Transfer: Contact guard assist;Ambulation;Regular Toilet;Rolling walker (2 wheels);Grab bars Toilet Transfer Details (indicate cue type and reason): min assist to stand from toilet with use of grab bars           General ADL Comments: remains limited by BLE weakness    Extremity/Trunk Assessment              Vision       Perception     Praxis      Cognition Arousal: Alert Behavior During Therapy: Anxious Overall Cognitive Status: No family/caregiver present to determine baseline cognitive functioning  General Comments: able to recall 3/3 back precautions        Exercises      Shoulder Instructions       General Comments SpO2 95% on 2 liters and 90% on RA    Pertinent Vitals/ Pain       Pain Assessment Pain Assessment: Faces Faces Pain Scale: Hurts even more Pain Location: back Pain Descriptors / Indicators: Discomfort, Grimacing, Guarding Pain Intervention(s): Limited activity  within patient's tolerance, Monitored during session, Repositioned  Home Living                                          Prior Functioning/Environment              Frequency  Min 1X/week        Progress Toward Goals  OT Goals(current goals can now be found in the care plan section)  Progress towards OT goals: Progressing toward goals  Acute Rehab OT Goals Patient Stated Goal: go to rehab OT Goal Formulation: With patient Time For Goal Achievement: 03/18/23 Potential to Achieve Goals: Good ADL Goals Pt Will Perform Grooming: with supervision;standing Pt Will Perform Lower Body Dressing: with supervision;sit to/from stand Pt Will Transfer to Toilet: with supervision;ambulating Additional ADL Goal #1: Pt will tolerate at least 8 minutes of OOB activity to demonstrate improved tolerance for safe discharge home  Plan      Co-evaluation                 AM-PAC OT "6 Clicks" Daily Activity     Outcome Measure   Help from another person eating meals?: None Help from another person taking care of personal grooming?: A Little Help from another person toileting, which includes using toliet, bedpan, or urinal?: A Little Help from another person bathing (including washing, rinsing, drying)?: A Little Help from another person to put on and taking off regular upper body clothing?: A Little Help from another person to put on and taking off regular lower body clothing?: A Lot 6 Click Score: 18    End of Session Equipment Utilized During Treatment: Gait belt;Rolling walker (2 wheels)  OT Visit Diagnosis: Unsteadiness on feet (R26.81);Other abnormalities of gait and mobility (R26.89);Muscle weakness (generalized) (M62.81)   Activity Tolerance Patient tolerated treatment well   Patient Left in chair;with call bell/phone within reach;with family/visitor present   Nurse Communication Mobility status        Time: 4010-2725 OT Time Calculation (min): 22  min  Charges: OT General Charges $OT Visit: 1 Visit OT Treatments $Self Care/Home Management : 8-22 mins  Alfonse Flavors, OTA Acute Rehabilitation Services  Office [phone]   Dewain Penning 03/07/2023, 2:24 PM

## 2023-03-07 NOTE — Discharge Summary (Signed)
Physician Discharge Summary  Patient ID: Jill Holt MRN: [id number] DOB/AGE: 1970/06/23 52 y.o.  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    Discharge Diagnoses: same   Discharged Condition: good  Hospital Course: The patient was admitted on 03/03/2023 and taken to the operating room where the patient underwent TLIF L3-4, L4-5. The patient tolerated the procedure well and was taken to the recovery room and then to the floor in stable condition. The hospital course was routine. There were no complications. The wound remained clean dry and intact. Pt had appropriate back soreness. No complaints of leg pain or new N/T/W. The patient remained afebrile with stable vital signs, and tolerated a regular diet. The patient continued to increase activities, and pain was well controlled with oral pain medications.   Consults: None  Significant Diagnostic Studies:  Results for orders placed or performed during the hospital encounter of 03/03/23  Glucose, capillary  Result Value Ref Range   Glucose-Capillary 107 (H) 70 - 99 mg/dL   Comment 1 Notify RN   Glucose, capillary  Result Value Ref Range   Glucose-Capillary 94 70 - 99 mg/dL  Glucose, capillary  Result Value Ref Range   Glucose-Capillary 94 70 - 99 mg/dL  Glucose, capillary  Result Value Ref Range   Glucose-Capillary 149 (H) 70 - 99 mg/dL  Glucose, capillary  Result Value Ref Range   Glucose-Capillary 167 (H) 70 - 99 mg/dL  Glucose, capillary  Result Value Ref Range   Glucose-Capillary 324 (H) 70 - 99 mg/dL  Glucose, capillary  Result Value Ref Range   Glucose-Capillary 194 (H) 70 - 99 mg/dL  Glucose, capillary  Result Value Ref Range   Glucose-Capillary 144 (H) 70 - 99 mg/dL  Glucose, capillary  Result Value Ref Range   Glucose-Capillary 160 (H) 70 - 99 mg/dL   Comment 1 Notify RN     Comment 2 Document in Chart   Glucose, capillary  Result Value Ref Range   Glucose-Capillary 89 70 - 99 mg/dL   Comment 1 Notify RN    Comment 2 Document in Chart   Glucose, capillary  Result Value Ref Range   Glucose-Capillary 126 (H) 70 - 99 mg/dL   Comment 1 Notify RN    Comment 2 Document in Chart   Glucose, capillary  Result Value Ref Range   Glucose-Capillary 129 (H) 70 - 99 mg/dL   Comment 1 Notify RN    Comment 2 Document in Chart   Glucose, capillary  Result Value Ref Range   Glucose-Capillary 81 70 - 99 mg/dL   Comment 1 Notify RN    Comment 2 Document in Chart   Glucose, capillary  Result Value Ref Range   Glucose-Capillary 111 (H) 70 - 99 mg/dL   Comment 1 Notify RN    Comment 2 Document in Chart   Pregnancy, urine POC  Result Value Ref Range   Preg Test, Ur NEGATIVE NEGATIVE  ABO/Rh  Result Value Ref Range   ABO/RH(D)      B POS Performed at [facility], [address] [place], [place] [address]     DG Lumbar Spine 2-3 Views  Result Date: 03/03/2023 CLINICAL DATA:  Elective surgery. EXAM: LUMBAR SPINE - 2-3 VIEW COMPARISON:  None Available. FINDINGS: Two fluoroscopic spot views of the lumbar spine obtained in the operating room. Pedicle screws are present at L3, L4, and L5  with interbody spacers in place. Fluoroscopy time 1 minutes 1 seconds. Dose 40.90 mGy. IMPRESSION: Intraoperative fluoroscopy during lumbar fusion. Electronically Signed   By: Narda Rutherford M.D.   On: 03/03/2023 20:09   DG C-Arm 1-60 Min-No Report  Result Date: 03/03/2023 Fluoroscopy was utilized by the requesting physician.  No radiographic interpretation.   DG C-Arm 1-60 Min-No Report  Result Date: 03/03/2023 Fluoroscopy was utilized by the requesting physician.  No radiographic interpretation.   DG C-Arm 1-60 Min-No Report  Result Date: 03/03/2023 Fluoroscopy was utilized by the requesting physician.  No radiographic interpretation.    Antibiotics:  Anti-infectives  (From admission, onward)    Start     Dose/Rate Route Frequency Ordered Stop   03/03/23 2100  ceFAZolin (ANCEF) IVPB 2g/100 mL premix  Status:  Discontinued        2 g 200 mL/hr over 30 Minutes Intravenous Every 8 hours 03/03/23 1752 03/05/23 1529   03/03/23 0945  ceFAZolin (ANCEF) IVPB 2g/100 mL premix        2 g 200 mL/hr over 30 Minutes Intravenous On call to O.R. 03/03/23 0934 03/03/23 1300       Discharge Exam: Blood pressure 137/82, pulse (!) 106, temperature 99.5 F (37.5 C), temperature source Oral, resp. rate 18, height 5\' 4"  (1.626 m), weight 88.5 kg, last menstrual period 05/31/2022, SpO2 98%. Neurologic: Grossly normal Ambulating and voiding well incision cdi   Discharge Medications:   Allergies as of 03/07/2023       Reactions   Lisinopril Cough        Medication List     STOP taking these medications    HYDROcodone-acetaminophen 5-325 MG tablet Commonly known as: NORCO/VICODIN Replaced by: HYDROcodone-acetaminophen 10-325 MG tablet       TAKE these medications    amLODipine 10 MG tablet Commonly known as: NORVASC Take 10 mg by mouth in the morning.   atorvastatin 80 MG tablet Commonly known as: LIPITOR Take 1 tablet by mouth daily.   cyclobenzaprine 10 MG tablet Commonly known as: FLEXERIL Take 1 tablet (10 mg total) by mouth 3 (three) times daily as needed for muscle spasms.   gabapentin 300 MG capsule Commonly known as: NEURONTIN Take 600 mg by mouth 2 (two) times daily.   glipiZIDE 10 MG 24 hr tablet Commonly known as: GLUCOTROL XL Take 1 tablet by mouth daily.   HumuLIN 70/30 KwikPen (70-30) 100 UNIT/ML KwikPen Generic drug: insulin isophane & regular human KwikPen Inject 25-35 Units into the skin See admin instructions. Inject 35 units subcutaneously in the morning & inject 25 units subcutaneously at night   HYDROcodone-acetaminophen 10-325 MG tablet Commonly known as: NORCO Take 1 tablet by mouth every 4 (four) hours as needed for  moderate pain (pain score 4-6). Replaces: HYDROcodone-acetaminophen 5-325 MG tablet   losartan-hydrochlorothiazide 100-12.5 MG tablet Commonly known as: HYZAAR Take 1 tablet by mouth in the morning.   metoprolol succinate 25 MG 24 hr tablet Commonly known as: TOPROL-XL Take 25 mg by mouth daily.   omeprazole 40 MG capsule Commonly known as: PRILOSEC Take 40 mg by mouth daily before breakfast.   ondansetron 4 MG disintegrating tablet Commonly known as: ZOFRAN-ODT 4mg  ODT q4 hours prn nausea/vomit   polyethylene glycol 17 g packet Commonly known as: MiraLax Take 17 g by mouth daily. Take additional doses initially for bowel cleanout. What changed:  when to take this reasons to take this additional instructions        Disposition: home   Final  Dx: TLIF L3-4, L4-5  Discharge Instructions     Call MD for:   Complete by: As directed    Call MD for:  difficulty breathing, headache or visual disturbances   Complete by: As directed    Call MD for:  hives   Complete by: As directed    Call MD for:  persistant nausea and vomiting   Complete by: As directed    Call MD for:  redness, tenderness, or signs of infection (pain, swelling, redness, odor or green/yellow discharge around incision site)   Complete by: As directed    Call MD for:  severe uncontrolled pain   Complete by: As directed    Call MD for:  temperature >100.4   Complete by: As directed    Diet - low sodium heart healthy   Complete by: As directed    Driving Restrictions   Complete by: As directed    No driving for 2 weeks, no riding in the car for 1 week   Increase activity slowly   Complete by: As directed    Lifting restrictions   Complete by: As directed    No lifting more than 8 lbs   Remove dressing in 24 hours   Complete by: As directed           Signed: Tiana Loft Izabellah Dadisman 03/07/2023, 10:38 AM

## 2023-03-07 NOTE — Plan of Care (Signed)
  Problem: Consults Goal: RH SPINAL CORD INJURY PATIENT EDUCATION Description:  See Patient Education module for education specifics.  Outcome: Progressing   Problem: SCI BOWEL ELIMINATION Goal: RH STG MANAGE BOWEL WITH ASSISTANCE Description: STG Manage Bowel with min Assistance. Outcome: Progressing Goal: RH STG SCI MANAGE BOWEL WITH MEDICATION WITH ASSISTANCE Description: STG SCI Manage bowel with medication with min assistance. Outcome: Progressing Goal: RH STG SCI MANAGE BOWEL PROGRAM W/ASSIST OR AS APPROPRIATE Description: STG SCI Manage bowel program w/min assist or as appropriate. Outcome: Progressing   Problem: SCI BLADDER ELIMINATION Goal: RH STG MANAGE BLADDER WITH ASSISTANCE Description: STG Manage Bladder With min Assistance Outcome: Progressing Goal: RH STG MANAGE BLADDER WITH MEDICATION WITH ASSISTANCE Description: STG Manage Bladder With Medication With min Assistance. Outcome: Progressing   Problem: RH SKIN INTEGRITY Goal: RH STG SKIN FREE OF INFECTION/BREAKDOWN Description: Incision will remain intact and be free of infection/breakdown with min assist  Outcome: Progressing Goal: RH STG MAINTAIN SKIN INTEGRITY WITH ASSISTANCE Description: STG Maintain Skin Integrity With min Assistance. Outcome: Progressing Goal: RH STG ABLE TO PERFORM INCISION/WOUND CARE W/ASSISTANCE Description: STG Able To Perform Incision/Wound Care With min Assistance. Outcome: Progressing   Problem: RH SAFETY Goal: RH STG ADHERE TO SAFETY PRECAUTIONS W/ASSISTANCE/DEVICE Description: STG Adhere to Safety Precautions With min Assistance/Device. Outcome: Progressing Goal: RH STG DECREASED RISK OF FALL WITH ASSISTANCE Description: STG Decreased Risk of Fall With cueing Assistance. Outcome: Progressing   Problem: RH PAIN MANAGEMENT Goal: RH STG PAIN MANAGED AT OR BELOW PT'S PAIN GOAL Description: Less than 4 with PRN medications min assist  Outcome: Progressing   Problem: RH KNOWLEDGE  DEFICIT SCI Goal: RH STG INCREASE KNOWLEDGE OF SELF CARE AFTER SCI Description: Patient will be able to manage medications, bowels, and diet/ lifestyle modifications to improve A1C of 7.8 from nursing education and nursing handouts independently  Outcome: Progressing

## 2023-03-07 NOTE — Progress Notes (Signed)
[name], MD  Physician Physical Medicine and Rehabilitation   PMR Pre-admission    Signed   Date of Service: 03/07/2023  9:25 AM  Related encounter: Admission (Current) from 03/03/2023 in [facility]   Signed     Expand All Collapse All  PMR Admission Coordinator Pre-Admission Assessment   Patient: Jill Holt is an 52 y.o., female MRN: [id number] DOB: Mar 07, 1971 Height: 5\' 4"  (162.6 cm) Weight: 88.5 kg                                                                                                                                                  Insurance Information HMO:     PPO:      PCP:      IPA:      80/20:      OTHER:  PRIMARY: [employer]      Policy#: [id number]      Subscriber: pt CM Name: faxed approval      Phone#: n/a     Fax#: [phone] Pre-Cert#: [id number] auth for CIR via fax with updates due to fax above on 11/14    Employer:  Benefits:  Phone #: [phone]     Name:  Eff. Date: 08/28/22     Deduct: $0      Out of Pocket Max: $0      Life Max: n/a  CIR: 100%      SNF: 100% Outpatient:      Co-Pay: $4/visit Home Health: 100%      Co-Pay:  DME: 100%     Co-Pay:  Providers:  SECONDARY:       Policy#:       Phone#:    Artist:       Phone#:    The "Data Collection Information Summary" for patients in Inpatient Rehabilitation Facilities with attached "Privacy Act Statement-Health Care Records" was provided and verbally reviewed with: Patient and Family   Emergency Contact Information Contact Information       Name Relation Home Work Mobile    Jill Holt,Jill Holt Daughter     [phone]         Other Contacts   None on File      Current Medical History  Patient Admitting Diagnosis: lumbar radiculopathy, s/p L3-5  History of Present Illness: Pt is a 52 y/o female with PMH of DM, HTN, anemia with prior lumbar laminectomy/decompression in March, who developed progressive back pain  and bilateral hip pain over the course of the last several months.  MRI showed progression of degenerative disc disease as well as spinal stenosis at L3-4 and L4-5.  She admitted to [facility] on 11/4 for redo decompressive lami-fusion at L3-4 and L4-5 per Dr. Wynetta Emery.  Post operatively pt with improvement in radicular pain with ongoing LE weakness and reliance  on UEs for mobility, also with bilat foot drop.  Therapy evaluations completed and pt was recommended for CIR.  Glasgow Coma Scale Score: 15   Patient's medical record from [facility] has been reviewed by the rehabilitation admission coordinator and physician.   Past Medical History      Past Medical History:  Diagnosis Date   Acute urinary retention     Anemia      Iron Deficiency Anemia   Arthritis     Constipation, acute      s/p each surgery   COVID 09/2018    had pneumonia   Diabetes mellitus without complication (HCC)     GERD (gastroesophageal reflux disease)     Hypertension     Pneumonia            Has the patient had major surgery during 100 days prior to admission? Yes   Family History  family history is not on file.     Current Medications   Current Medications    Current Facility-Administered Medications:    acetaminophen (TYLENOL) tablet 650 mg, 650 mg, Oral, Q4H PRN **OR** acetaminophen (TYLENOL) suppository 650 mg, 650 mg, Rectal, Q4H PRN, Donalee Citrin, MD   alum & mag hydroxide-simeth (MAALOX/MYLANTA) 200-200-20 MG/5ML suspension 30 mL, 30 mL, Oral, Q6H PRN, Donalee Citrin, MD   amLODipine (NORVASC) tablet 10 mg, 10 mg, Oral, q AM, Donalee Citrin, MD, 10 mg at 03/07/23 0854   atorvastatin (LIPITOR) tablet 80 mg, 80 mg, Oral, Daily, Donalee Citrin, MD, 80 mg at 03/07/23 0854   bisacodyl (DULCOLAX) EC tablet 5-10 mg, 5-10 mg, Oral, BID PRN, Donalee Citrin, MD, 5 mg at 03/04/23 2213   cyclobenzaprine (FLEXERIL) tablet 10 mg, 10 mg, Oral, TID PRN, Donalee Citrin, MD, 10 mg at 03/06/23 1712   docusate sodium (COLACE) capsule 100  mg, 100 mg, Oral, BID, Donalee Citrin, MD, 100 mg at 03/07/23 0854   gabapentin (NEURONTIN) capsule 600 mg, 600 mg, Oral, BID, Donalee Citrin, MD, 600 mg at 03/07/23 0854   glipiZIDE (GLUCOTROL XL) 24 hr tablet 10 mg, 10 mg, Oral, Q breakfast, Donalee Citrin, MD, 10 mg at 03/07/23 0859   losartan (COZAAR) tablet 100 mg, 100 mg, Oral, Daily, 100 mg at 03/06/23 0919 **AND** hydrochlorothiazide (HYDRODIURIL) tablet 12.5 mg, 12.5 mg, Oral, Daily, Leander Rams, RPH, 12.5 mg at 03/07/23 0854   HYDROcodone-acetaminophen (NORCO) 10-325 MG per tablet 1 tablet, 1 tablet, Oral, Q4H PRN, Donalee Citrin, MD, 1 tablet at 03/06/23 1655   HYDROcodone-acetaminophen (NORCO) 10-325 MG per tablet 2 tablet, 2 tablet, Oral, Q4H PRN, Donalee Citrin, MD, 1 tablet at 03/06/23 0919   HYDROmorphone (DILAUDID) injection 0.5 mg, 0.5 mg, Intravenous, Q2H PRN, Donalee Citrin, MD   insulin aspart protamine- aspart (NOVOLOG MIX 70/30) injection 25 Units, 25 Units, Subcutaneous, Q supper, Leander Rams, RPH, 25 Units at 03/05/23 1831   insulin aspart protamine- aspart (NOVOLOG MIX 70/30) injection 35 Units, 35 Units, Subcutaneous, Q breakfast, Leander Rams, RPH, 35 Units at 03/07/23 0900   menthol-cetylpyridinium (CEPACOL) lozenge 3 mg, 1 lozenge, Oral, PRN **OR** phenol (CHLORASEPTIC) mouth spray 1 spray, 1 spray, Mouth/Throat, PRN, Donalee Citrin, MD   metoprolol succinate (TOPROL-XL) 24 hr tablet 25 mg, 25 mg, Oral, Daily, Donalee Citrin, MD, 25 mg at 03/07/23 0854   ondansetron (ZOFRAN) tablet 4 mg, 4 mg, Oral, Q6H PRN **OR** ondansetron (ZOFRAN) injection 4 mg, 4 mg, Intravenous, Q6H PRN, Donalee Citrin, MD   Oral care mouth rinse, 15 mL, Mouth Rinse,  PRN, Donalee Citrin, MD   pantoprazole (PROTONIX) EC tablet 40 mg, 40 mg, Oral, Daily, Donalee Citrin, MD, 40 mg at 03/07/23 0854   pneumococcal 20-valent conjugate vaccine (PREVNAR 20) injection 0.5 mL, 0.5 mL, Intramuscular, Tomorrow-1000, Donalee Citrin, MD   polyethylene glycol (MIRALAX / GLYCOLAX) packet 17 g, 17 g, Oral,  Daily PRN, Donalee Citrin, MD, 17 g at 03/05/23 1830   sodium chloride flush (NS) 0.9 % injection 3 mL, 3 mL, Intravenous, Q12H, Donalee Citrin, MD, 3 mL at 03/03/23 2133   sodium chloride flush (NS) 0.9 % injection 3 mL, 3 mL, Intravenous, PRN, Donalee Citrin, MD     Patients Current Diet:  Diet Order                  Diet Carb Modified Fluid consistency: Thin; Room service appropriate? Yes  Diet effective now                         Precautions / Restrictions Precautions Precautions: Back Precaution Booklet Issued: Yes (comment) Precaution Comments: pt able to recall 2/3 precautions Restrictions Weight Bearing Restrictions: No    Has the patient had 2 or more falls or a fall with injury in the past year?No   Prior Activity Level Limited Community (1-2x/wk): very limited community mobility with rollator, LE weakness causing falls and safety issues   Prior Functional Level Prior Function Prior Level of Function : Needs assist Mobility Comments: pt reports using a RW since march however had a fall and began using a rollator however the brakes don't work per pt report. Pt very dependent on UEs to get up the stairs (pt showed PT a video from her blink camera on how to goes up the stairs" ADLs Comments: doesn't get in the tub, was able to dress/bath self   Self Care: Did the patient need help bathing, dressing, using the toilet or eating?  Independent   Indoor Mobility: Did the patient need assistance with walking from room to room (with or without device)? Independent   Stairs: Did the patient need assistance with internal or external stairs (with or without device)? Independent   Functional Cognition: Did the patient need help planning regular tasks such as shopping or remembering to take medications? Independent   Patient Information Are you of Hispanic, Latino/a,or Spanish origin?: A. No, not of Hispanic, Latino/a, or Spanish origin What is your race?: B. Black or African  American Do you need or want an interpreter to communicate with a doctor or health care staff?: 0. No   Patient's Response To:  Health Literacy and Transportation Is the patient able to respond to health literacy and transportation needs?: Yes Health Literacy - How often do you need to have someone help you when you read instructions, pamphlets, or other written material from your doctor or pharmacy?: Never In the past 12 months, has lack of transportation kept you from medical appointments or from getting medications?: No In the past 12 months, has lack of transportation kept you from meetings, work, or from getting things needed for daily living?: No   Home Assistive Devices / Equipment Home Equipment: Agricultural consultant (2 wheels), Rollator (4 wheels), BSC/3in1   Prior Device Use: Indicate devices/aids used by the patient prior to current illness, exacerbation or injury? Walker   Current Functional Level Cognition   Overall Cognitive Status: No family/caregiver present to determine baseline cognitive functioning Orientation Level: Oriented X4 General Comments: overall WFL, recalled 3/3 precuations.  Extremity Assessment (includes Sensation/Coordination)   Upper Extremity Assessment: Overall WFL for tasks assessed  Lower Extremity Assessment: Defer to PT evaluation RLE Deficits / Details: minimal active DF, LAQ less than 1/2 the range RLE Sensation:  (numbness in foot) LLE Deficits / Details: minimal active DF otherwise grossly 3+/5 LLE Sensation:  (numbness in foot)     ADLs   Overall ADL's : Needs assistance/impaired Eating/Feeding: Independent Grooming: Contact guard assist, Standing Grooming Details (indicate cue type and reason): needs at least 1 UE supported externally Upper Body Bathing: Set up, Sitting Lower Body Bathing: Moderate assistance, Sit to/from stand Upper Body Dressing : Set up, Sitting Lower Body Dressing: Moderate assistance, Sit to/from stand Lower Body  Dressing Details (indicate cue type and reason): pt not attempting figure four despite cues Toilet Transfer: Contact guard assist, Rolling walker (2 wheels) Toilet Transfer Details (indicate cue type and reason): remains limited by foot drop Toileting- Clothing Manipulation and Hygiene: Supervision/safety, Sitting/lateral lean Functional mobility during ADLs: Contact guard assist General ADL Comments: remains limited by BLE weakness     Mobility   Overal bed mobility: Needs Assistance Bed Mobility: Sit to Sidelying, Rolling, Sidelying to Sit Rolling: Contact guard assist Sidelying to sit: Mod assist Sit to sidelying: Mod assist General bed mobility comments: mod A to elevat trunk to sitting and return LEs to bed at end of session     Transfers   Overall transfer level: Needs assistance Equipment used: Rolling walker (2 wheels) Transfers: Sit to/from Stand Sit to Stand: Contact guard assist, Min assist General transfer comment: increased time and effort, min A from low chair     Ambulation / Gait / Stairs / Wheelchair Mobility   Ambulation/Gait Ambulation/Gait assistance: Contact guard assist Gait Distance (Feet): 40 Feet Assistive device: Rolling walker (2 wheels) Gait Pattern/deviations: Step-to pattern, Decreased dorsiflexion - right, Decreased dorsiflexion - left, Decreased stride length, Trunk flexed General Gait Details: pt very dependent on UEs, pt with increased difficulty clearing R foot compared to L however both LEs with decreased active DF. distance limited to pt stated [place] with pt needing to sit Gait velocity: dec Gait velocity interpretation: <1.8 ft/sec, indicate of risk for recurrent falls Stairs: Yes General stair comments: attempted to complete stair negotiation several different ways however unsuccessful. Pt unable to lift R or L LE up onto the step. Pt reports having to bend over railing at home to lift her R LE up on step in which she can't do now with her  back precautions     Posture / Balance Balance Overall balance assessment: Needs assistance Sitting-balance support: Feet supported, Single extremity supported Sitting balance-Leahy Scale: Fair Standing balance support: Bilateral upper extremity supported, Reliant on assistive device for balance, During functional activity Standing balance-Leahy Scale: Poor Standing balance comment: pt able to stand at sink to wash hands with intermittent unilateral UE support     Special needs/care consideration Diabetic management yes        Previous Home Environment (from acute therapy documentation) Living Arrangements: Children Available Help at Discharge: Family, Available PRN/intermittently Type of Home: Other(Comment) Home Layout: One level Home Access: Stairs to enter Entrance Stairs-Rails: Left Entrance Stairs-Number of Steps: 3 Bathroom Shower/Tub: Associate Professor: Yes Home Care Services: No Additional Comments: pt had bed taken off bed frame to sit lower; her dtr is currently on maternity leave   Discharge Living Setting Plans for Discharge Living Setting: Patient's home, Lives with (comment) (daughter) Type of Home at  Discharge: House Discharge Home Layout: One level Discharge Home Access: Stairs to enter Entrance Stairs-Rails: Left Entrance Stairs-Number of Steps: 3 Discharge Bathroom Shower/Tub: Tub/shower unit Discharge Bathroom Toilet: Standard Discharge Bathroom Accessibility: Yes How Accessible: Accessible via walker Does the patient have any problems obtaining your medications?: No   Social/Family/Support Systems Patient Roles: Parent Contact Information: adult daughter, Jill Holt Anticipated Caregiver: Jill Holt Anticipated Caregiver's Contact Information: [phone] Ability/Limitations of Caregiver: newly post-partum, can provide supervision only and will need to return to work in the coming weeks Caregiver  Availability: 24/7 (initially) Discharge Plan Discussed with Primary Caregiver: Yes Is Caregiver In Agreement with Plan?: Yes Does Caregiver/Family have Issues with Lodging/Transportation while Pt is in Rehab?: No     Goals Patient/Family Goal for Rehab: PT/OT supervision to mod I, SLP n/a Expected length of stay: 8-11 days Additional Information: Discharge plan: return to pt's home where she lives with her daughter.  Jill Holt is there 24/7 for now until she has to return back to work, but she is newly postpartum and cannot provide physical assist Pt/Family Agrees to Admission and willing to participate: Yes Program Orientation Provided & Reviewed with Pt/Caregiver Including Roles  & Responsibilities: Yes  Barriers to Discharge: Home environment access/layout, Decreased caregiver support     Decrease burden of Care through IP rehab admission: n/a     Possible need for SNF placement upon discharge:Not anticipated.  Discharge plan: return to pt's home where she lives with her daughter who can provide 24/7 supervision if needed, but expect pt to reach mod I level.      Patient Condition: This patient's condition remains as documented in the consult dated 11/7, in which the Rehabilitation Physician determined and documented that the patient's condition is appropriate for intensive rehabilitative care in an inpatient rehabilitation facility. Will admit to inpatient rehab today.   Preadmission Screen Completed By:  Stephania Fragmin, PT, 03/07/2023 9:53 AM ______________________________________________________________________   Discussed status with Dr. Riley Kill on 03/07/23 at  9:53 AM  and received approval for admission today.   Admission Coordinator:  Stephania Fragmin, PT, DPT time9:53 AM Dorna Bloom 03/07/23             Revision History

## 2023-03-07 NOTE — H&P (Addendum)
Expand All Collapse All      Physical Medicine and Rehabilitation Admission H&P       HPI: Jill Holt, is a 52 year old right-handed female with history of hypertension, diabetes mellitus, iron deficiency anemia as well as history of lumbar L3-4 and L4-5 laminotomies.  Per chart review patient lives with her children.  1 level home 3 steps to entry.  She has using a rolling walker since March due to bilateral foot drop.  She essentially sponge bathes for her ADLs.  Presented 03/03/2023 with bilateral hip and leg pain.  Workup and imaging revealed degenerative disc disease spinal stenosis L3-4 and 4-5.  Underwent redo decompressive laminectomies L3-4 and L4-L5 with complete facetectomies and radical foraminotomies of L3-4-5 nerve roots bilaterally by Dr. Wynetta Emery 03/03/2023 as well as transforaminal lumbar interbody fusions L3-4 and L5 with screw fixation and posterior lateral arthrodesis.  SCDs in place for DVT prophylaxis.  No postoperative orders were given for a brace.  Therapy evaluations completed due to patient decreased functional mobility was admitted for a comprehensive rehab program.  Pt reports dizziness and sob at times when she's been up to ambulate. Is currently on 2L oxygen for sats down to 91. Denies SOB at rest.    Review of Systems  Constitutional:  Negative for chills and fever.  HENT:  Negative for hearing loss.   Eyes:  Negative for blurred vision and double vision.  Respiratory:  Positive for shortness of breath. Negative for cough and wheezing.   Cardiovascular:  Negative for chest pain, palpitations and leg swelling.  Gastrointestinal:  Positive for constipation. Negative for heartburn, nausea and vomiting.       GERD  Genitourinary:  Negative for dysuria, flank pain and hematuria.  Musculoskeletal:  Positive for back pain, falls, joint pain and myalgias.  Skin:  Negative for rash.  Neurological:  Positive for dizziness, tingling, sensory change and weakness.  All other  systems reviewed and are negative.       Past Medical History:  Diagnosis Date   Acute urinary retention     Anemia      Iron Deficiency Anemia   Arthritis     Constipation, acute      s/p each surgery   COVID 09/2018    had pneumonia   Diabetes mellitus without complication (HCC)     GERD (gastroesophageal reflux disease)     Hypertension     Pneumonia               Past Surgical History:  Procedure Laterality Date   CARPAL TUNNEL RELEASE Bilateral     FEMUR FRACTURE SURGERY Left     ganglion cyst removal Right      Hand   LUMBAR LAMINECTOMY/DECOMPRESSION MICRODISCECTOMY Left 07/24/2022    Procedure: Sublaminar decompression - left - Lumbar Three-Lumbar Four - Lumbar Four-Lumbar Five;  Surgeon: Donalee Citrin, MD;  Location: [facility] OR;  Service: Neurosurgery;  Laterality: Left;  3C   TUBAL LIGATION        1990s        History reviewed. No pertinent family history.     Social History:  reports that she has never smoked. She has never been exposed to tobacco smoke. She has never used smokeless tobacco. She reports current alcohol use. She reports that she does not use drugs. Allergies:  Allergies      Allergies  Allergen Reactions   Lisinopril Cough            Medications Prior to Admission  Medication Sig Dispense Refill   amLODipine (NORVASC) 10 MG tablet Take 10 mg by mouth in the morning.       atorvastatin (LIPITOR) 80 MG tablet Take 1 tablet by mouth daily.       cyclobenzaprine (FLEXERIL) 10 MG tablet Take 1 tablet (10 mg total) by mouth 3 (three) times daily as needed for muscle spasms. 30 tablet 0   gabapentin (NEURONTIN) 300 MG capsule Take 600 mg by mouth 2 (two) times daily.       glipiZIDE (GLUCOTROL XL) 10 MG 24 hr tablet Take 1 tablet by mouth daily.       insulin isophane & regular human KwikPen (HUMULIN 70/30 KWIKPEN) (70-30) 100 UNIT/ML KwikPen Inject 25-35 Units into the skin See admin instructions. Inject 35 units subcutaneously in the morning & inject  25 units subcutaneously at night       losartan-hydrochlorothiazide (HYZAAR) 100-12.5 MG tablet Take 1 tablet by mouth in the morning.       metoprolol succinate (TOPROL-XL) 25 MG 24 hr tablet Take 25 mg by mouth daily.       omeprazole (PRILOSEC) 40 MG capsule Take 40 mg by mouth daily before breakfast.       HYDROcodone-acetaminophen (NORCO/VICODIN) 5-325 MG tablet Take 1 tablet by mouth every 4 (four) hours as needed for severe pain ((score 7 to 10)). (Patient not taking: Reported on 02/18/2023) 30 tablet 0   ondansetron (ZOFRAN-ODT) 4 MG disintegrating tablet 4mg  ODT q4 hours prn nausea/vomit 20 tablet 0   polyethylene glycol (MIRALAX) 17 g packet Take 17 g by mouth daily. Take additional doses initially for bowel cleanout. (Patient taking differently: Take 17 g by mouth daily as needed for moderate constipation.) 30 each 0              Home: Home Living Family/patient expects to be discharged to:: Private residence Living Arrangements: Children Available Help at Discharge: Family, Available PRN/intermittently Type of Home: Other(Comment) Home Access: Stairs to enter [employer] of Steps: 3 Entrance Stairs-Rails: Left Home Layout: One level Bathroom Shower/Tub: Engineer, manufacturing systems: Standard Bathroom Accessibility: Yes Home Equipment: Agricultural consultant (2 wheels), Rollator (4 wheels), BSC/3in1 Additional Comments: pt had bed taken off bed frame to sit lower; her dtr is currently on maternity leave   Functional History: Prior Function Prior Level of Function : Needs assist Mobility Comments: pt reports using a RW since march however had a fall and began using a rollator however the brakes don't work per pt report. Pt very dependent on UEs to get up the stairs (pt showed PT a video from her blink camera on how to goes up the stairs" ADLs Comments: doesn't get in the tub, was able to dress/bath self   Functional Status:  Mobility: Bed Mobility Overal bed  mobility: Needs Assistance Bed Mobility: Sit to Sidelying, Rolling, Sidelying to Sit Rolling: Contact guard assist Sidelying to sit: Mod assist Sit to sidelying: Mod assist General bed mobility comments: mod A to elevat trunk to sitting and return LEs to bed at end of session Transfers Overall transfer level: Needs assistance Equipment used: Rolling walker (2 wheels) Transfers: Sit to/from Stand Sit to Stand: Contact guard assist, Min assist General transfer comment: increased time and effort, min A from low chair Ambulation/Gait Ambulation/Gait assistance: Contact guard assist Gait Distance (Feet): 40 Feet Assistive device: Rolling walker (2 wheels) Gait Pattern/deviations: Step-to pattern, Decreased dorsiflexion - right, Decreased dorsiflexion - left, Decreased stride length, Trunk flexed General Gait Details: pt very  dependent on UEs, pt with increased difficulty clearing R foot compared to L however both LEs with decreased active DF. distance limited to pt stated [place] with pt needing to sit Gait velocity: dec Gait velocity interpretation: <1.8 ft/sec, indicate of risk for recurrent falls Stairs: Yes General stair comments: attempted to complete stair negotiation several different ways however unsuccessful. Pt unable to lift R or L LE up onto the step. Pt reports having to bend over railing at home to lift her R LE up on step in which she can't do now with her back precautions   ADL: ADL Overall ADL's : Needs assistance/impaired Eating/Feeding: Independent Grooming: Contact guard assist, Standing Grooming Details (indicate cue type and reason): needs at least 1 UE supported externally Upper Body Bathing: Set up, Sitting Lower Body Bathing: Moderate assistance, Sit to/from stand Upper Body Dressing : Set up, Sitting Lower Body Dressing: Moderate assistance, Sit to/from stand Lower Body Dressing Details (indicate cue type and reason): pt not attempting figure four despite  cues Toilet Transfer: Contact guard assist, Rolling walker (2 wheels) Toilet Transfer Details (indicate cue type and reason): remains limited by foot drop Toileting- Clothing Manipulation and Hygiene: Supervision/safety, Sitting/lateral lean Functional mobility during ADLs: Contact guard assist General ADL Comments: remains limited by BLE weakness   Cognition: Cognition Overall Cognitive Status: No family/caregiver present to determine baseline cognitive functioning Orientation Level: Oriented X4 Cognition Arousal: Alert Behavior During Therapy: Anxious Overall Cognitive Status: No family/caregiver present to determine baseline cognitive functioning General Comments: overall WFL, recalled 3/3 precuations.   Physical Exam: Blood pressure 137/82, pulse (!) 106, temperature 99.5 F (37.5 C), temperature source Oral, resp. rate 18, height 5\' 4"  (1.626 m), weight 88.5 kg, last menstrual period 05/31/2022, SpO2 98%. Physical Exam Constitutional:      General: She is not in acute distress.    Appearance: She is obese.  HENT:     Head: Normocephalic.     Right Ear: External ear normal.     Left Ear: External ear normal.     Nose: Nose normal.     Mouth/Throat:     Mouth: Mucous membranes are moist.  Eyes:     Extraocular Movements: Extraocular movements intact.     Pupils: Pupils are equal, round, and reactive to light.  Cardiovascular:     Rate and Rhythm: Regular rhythm. Tachycardia present.     Heart sounds: No murmur heard. Pulmonary:     Effort: Pulmonary effort is normal. No respiratory distress.     Breath sounds: Normal breath sounds. No wheezing or rales.     Comments: On 2L O2 via [place] Abdominal:     General: Bowel sounds are normal. There is no distension.     Palpations: Abdomen is soft.     Tenderness: There is no abdominal tenderness.  Musculoskeletal:        General: Tenderness (bilateral LE below knees) present. No swelling.     Cervical back: Normal range of  motion.     Heel cords a little tight Skin:    General: Skin is warm.     Comments: Back incision is dressed with honeycomb dressing. Minimal drainage  Neurological:     Mental Status: She is alert.     Comments: Alert and oriented x 3. Normal insight and awareness. Intact Memory. Normal language and speech. Cranial nerve exam unremarkable. MMT: UE 5/5. RLE 3/5 HF, KE and tr/5 ADF, 2/5 APF. LLE 3- to 3/5 HF,KE and 0-tr ADF, 1+ APF. Decreased  LT in both feet, lower legs L>R. DTR's absent in LE's. No abnl tone. ?mild intentional tremor  Psychiatric:        Mood and Affect: Mood normal.        Behavior: Behavior normal.        Lab Results Last 48 Hours        Results for orders placed or performed during the hospital encounter of 03/03/23 (from the past 48 hour(s))  Glucose, capillary     Status: None    Collection Time: 03/05/23 12:49 PM  Result Value Ref Range    Glucose-Capillary 81 70 - 99 mg/dL      Comment: Glucose reference range applies only to samples taken after fasting for at least 8 hours.    Comment 1 Notify RN      Comment 2 Document in Chart    Glucose, capillary     Status: Abnormal    Collection Time: 03/07/23  8:50 AM  Result Value Ref Range    Glucose-Capillary 111 (H) 70 - 99 mg/dL      Comment: Glucose reference range applies only to samples taken after fasting for at least 8 hours.    Comment 1 Notify RN      Comment 2 Document in Chart        Imaging Results (Last 48 hours)  No results found.         Blood pressure 137/82, pulse (!) 106, temperature 99.5 F (37.5 C), temperature source Oral, resp. rate 18, height 5\' 4"  (1.626 m), weight 88.5 kg, last menstrual period 05/31/2022, SpO2 98%.   Medical Problem List and Plan: 1. 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  Status post redo decompressive laminectomies L3-4 and L4-5 with complete facetectomies and radical foraminotomies  03/03/2023             -patient may shower             -ELOS/Goals: 8-11 days with mod I goals PT, OT             -PRAFO's for both LE's at night             -will need AFO's for gait 2.  Antithrombotics: -DVT/anticoagulation:  Mechanical:  Antiembolism stockings, knee (TED hose) Bilateral lower extremities.  Check vascular study.  Discussed with neurosurgery on beginning Lovenox             -antiplatelet therapy: N/A 3. Pain Management: currently on 600 mg twice daily, Flexeril and hydrocodone as needed             -pt was on gabapentin 900mg  tid at home--increase to 600mg  tid to start 4. Mood/Behavior/Sleep: Provide emotional support             -antipsychotic agents: N/A 5. Neuropsych/cognition: This patient is capable of making decisions on her own behalf. 6. Skin/Wound Care: Routine skin checks 7. Fluids/Electrolytes/Nutrition: Routine in and outs with follow-up chemistries 8.  Hypertension.  Norvasc 10 mg daily, Cozaar 100 mg daily, hydrochlorothiazide 12.5 mg daily, Toprol-XL 25 mg daily.  Monitor with increased mobility 9.  Diabetes mellitus.  Hemoglobin A1c 7.8.  NovoLog Mix 70/30 35 units with breakfast, 25 units with supper, Glucotrol XL 10 mg daily. 10.  Hyperlipidemia.  Lipitor 11.  Constipation.  Colace twice daily, MiraLAX daily 12. Mild hypoxia:             -wean oxygen as able             -  check CBC in am 13. C/o dizziness while up with therapy. Sinus tachy on exam             -CBC as above, check BMET as well             -encourage fluids         Charlton Amor, PA-C 03/07/2023  I have personally performed a face to face diagnostic evaluation of this patient and formulated the key components of the plan.  Additionally, I have personally reviewed laboratory data, imaging studies, as well as relevant notes and concur with the physician assistant's documentation above.  The patient's status has not changed from the original H&P.  Any changes in documentation from the  acute care chart have been noted above.  [name], MD, [place] [facility]

## 2023-03-08 DIAGNOSIS — G8918 Other acute postprocedural pain: Secondary | ICD-10-CM | POA: Diagnosis not present

## 2023-03-08 DIAGNOSIS — M5416 Radiculopathy, lumbar region: Secondary | ICD-10-CM

## 2023-03-08 DIAGNOSIS — G8220 Paraplegia, unspecified: Secondary | ICD-10-CM

## 2023-03-08 DIAGNOSIS — I10 Essential (primary) hypertension: Secondary | ICD-10-CM

## 2023-03-08 LAB — BASIC METABOLIC PANEL
Anion gap: 11 (ref 5–15)
BUN: 13 mg/dL (ref 6–20)
CO2: 29 mmol/L (ref 22–32)
Calcium: 8.9 mg/dL (ref 8.9–10.3)
Chloride: 98 mmol/L (ref 98–111)
Creatinine, Ser: 0.88 mg/dL (ref 0.44–1.00)
GFR, Estimated: >60 mL/min (ref >60)
Glucose, Bld: 104 mg/dL — ABNORMAL HIGH (ref 70–99)
Potassium: 3.8 mmol/L (ref 3.5–5.1)
Sodium: 138 mmol/L (ref 135–145)

## 2023-03-08 LAB — GLUCOSE, CAPILLARY: Glucose-Capillary: 98 mg/dL (ref 70–99)

## 2023-03-08 LAB — CBC
HCT: 27.2 % — ABNORMAL LOW (ref 36.0–46.0)
Hemoglobin: 8.5 g/dL — ABNORMAL LOW (ref 12.0–15.0)
MCH: 23.7 pg — ABNORMAL LOW (ref 26.0–34.0)
MCHC: 31.3 g/dL (ref 30.0–36.0)
MCV: 75.8 fL — ABNORMAL LOW (ref 80.0–100.0)
Platelets: 218 10*3/uL (ref 150–400)
RBC: 3.59 MIL/uL — ABNORMAL LOW (ref 3.87–5.11)
RDW: 16 % — ABNORMAL HIGH (ref 11.5–15.5)
WBC: 6.7 10*3/uL (ref 4.0–10.5)
nRBC: 0 % (ref 0.0–0.2)

## 2023-03-08 MED ORDER — HYDROCODONE-ACETAMINOPHEN 5-325 MG PO TABS
1.0000 | ORAL_TABLET | ORAL | Status: DC | PRN
  Administered 2023-03-09: 2 via ORAL
  Administered 2023-03-09 – 2023-03-10 (×2): 1 via ORAL
  Administered 2023-03-10: 2 via ORAL
  Administered 2023-03-11 (×2): 1 via ORAL
  Administered 2023-03-12: 2 via ORAL
  Administered 2023-03-12 – 2023-03-20 (×16): 1 via ORAL
  Filled 2023-03-08: qty 1
  Filled 2023-03-08: qty 2
  Filled 2023-03-08 (×4): qty 1
  Filled 2023-03-08: qty 2
  Filled 2023-03-08 (×4): qty 1
  Filled 2023-03-08 (×2): qty 2
  Filled 2023-03-08: qty 1
  Filled 2023-03-08: qty 2
  Filled 2023-03-08: qty 1
  Filled 2023-03-08: qty 2
  Filled 2023-03-08: qty 1
  Filled 2023-03-08 (×2): qty 2
  Filled 2023-03-08 (×2): qty 1
  Filled 2023-03-08: qty 2
  Filled 2023-03-08: qty 1

## 2023-03-08 MED ORDER — POLYETHYLENE GLYCOL 3350 17 G PO PACK
17.0000 g | PACK | Freq: Two times a day (BID) | ORAL | Status: DC
  Administered 2023-03-08 – 2023-03-12 (×7): 17 g via ORAL
  Filled 2023-03-08 (×9): qty 1

## 2023-03-08 MED ORDER — LUBIPROSTONE 24 MCG PO CAPS
24.0000 ug | ORAL_CAPSULE | Freq: Two times a day (BID) | ORAL | Status: DC
  Administered 2023-03-08 – 2023-03-20 (×25): 24 ug via ORAL
  Filled 2023-03-08 (×26): qty 1

## 2023-03-08 NOTE — Evaluation (Signed)
Physical Therapy Assessment and Plan  Patient Details  Name: Onisha Mendosa MRN: [id number] Date of Birth: [birth year]  PT Diagnosis: Difficulty walking, Impaired sensation, Low back pain, and Pain in joint Rehab Potential: Good ELOS: 12-14 days   Today's Date: 03/08/2023 PT Individual Time: 0800-0915 PT Individual Time Calculation (min): 75 min    Hospital Problem: Principal Problem:   Lumbar radiculopathy   Past Medical History:  Past Medical History:  Diagnosis Date   Acute urinary retention    Anemia    Iron Deficiency Anemia   Arthritis    Constipation, acute    s/p each surgery   COVID 09/2018   had pneumonia   Diabetes mellitus without complication (HCC)    GERD (gastroesophageal reflux disease)    Hypertension    Pneumonia    Past Surgical History:  Past Surgical History:  Procedure Laterality Date   CARPAL TUNNEL RELEASE Bilateral    FEMUR FRACTURE SURGERY Left    ganglion cyst removal Right    Hand   LUMBAR LAMINECTOMY/DECOMPRESSION MICRODISCECTOMY Left 07/24/2022   Procedure: Sublaminar decompression - left - Lumbar Three-Lumbar Four - Lumbar Four-Lumbar Five;  Surgeon: Donalee Citrin, MD;  Location: [facility] OR;  Service: Neurosurgery;  Laterality: Left;  3C   TUBAL LIGATION     1990s    Assessment & Plan Clinical Impression: Sabirah Pippert, is a 52 year old right-handed female with history of hypertension, diabetes mellitus, iron deficiency anemia as well as history of lumbar L3-4 and L4-5 laminotomies.  Per chart review patient lives with her children.  1 level home 3 steps to entry.  She has using a rolling walker since March due to bilateral foot drop.  She essentially sponge bathes for her ADLs.  Presented 03/03/2023 with bilateral hip and leg pain.  Workup and imaging revealed degenerative disc disease spinal stenosis L3-4 and 4-5.  Underwent redo decompressive laminectomies L3-4 and L4-L5 with complete facetectomies and radical foraminotomies of L3-4-5 nerve roots  bilaterally by Dr. Wynetta Emery 03/03/2023 as well as transforaminal lumbar interbody fusions L3-4 and L5 with screw fixation and posterior lateral arthrodesis.  SCDs in place for DVT prophylaxis.  No postoperative orders were given for a brace.  Therapy evaluations completed due to patient decreased functional mobility was admitted for a comprehensive rehab program.   Pt reports dizziness and sob at times when she's been up to ambulate. Is currently on 2L oxygen for sats down to 91. Denies SOB at rest.   Patient transferred to CIR on 03/07/2023 .   Patient currently requires min with mobility secondary to muscle weakness and muscle joint tightness, decreased cardiorespiratoy endurance and decreased oxygen support, impaired timing and sequencing, unbalanced muscle activation, and decreased coordination, and decreased standing balance, decreased balance strategies, and difficulty maintaining precautions.  Prior to hospitalization, patient was modified independent  with mobility and lived with Daughter in a Apartment home.  Home access is 3Stairs to enter.  Patient will benefit from skilled PT intervention to maximize safe functional mobility, minimize fall risk, and decrease caregiver burden for planned discharge home with intermittent assist.  Anticipate patient will benefit from follow up OP at discharge.  PT - End of Session Activity Tolerance: Tolerates 10 - 20 min activity with multiple rests Endurance Deficit: Yes Endurance Deficit Description: limited by pain PT Assessment Rehab Potential (ACUTE/IP ONLY): Good PT Barriers to Discharge: Decreased caregiver support;Home environment access/layout;Neurogenic Bowel & Bladder PT Patient demonstrates impairments in the following area(s): Balance;Pain;Edema;Safety;Endurance;Sensory;Motor PT Transfers Functional Problem(s): Bed Mobility;Bed to Chair;Car PT  Locomotion Functional Problem(s): Ambulation;Wheelchair Mobility PT Plan PT Intensity: Minimum of 1-2  x/day ,45 to 90 minutes PT Frequency: 5 out of 7 days PT Duration Estimated Length of Stay: 12-14 days PT Treatment/Interventions: Ambulation/gait training;Discharge planning;DME/adaptive equipment instruction;Functional mobility training;Pain management;Psychosocial support;Splinting/orthotics;Therapeutic Activities;UE/LE Strength taining/ROM;Wheelchair propulsion/positioning;UE/LE Coordination activities;Therapeutic Exercise;Stair training;Patient/family education;Neuromuscular re-education;Functional electrical stimulation;Disease management/prevention;Balance/vestibular training;Community reintegration PT Transfers Anticipated Outcome(s): mod I with LRAD PT Locomotion Anticipated Outcome(s): mod I in the home, supervision community PT Recommendation Recommendations for Other Services: Neuropsych consult;Therapeutic Recreation consult Therapeutic Recreation Interventions: Stress management;Pet therapy;Outing/community reintergration Follow Up Recommendations: Outpatient PT Patient destination: Home Equipment Recommended: To be determined   PT Evaluation Precautions/Restrictions Precautions Precautions: Back Precaution Comments: Pt able to recall 3/3 precautions. Required Braces or Orthoses: Other Brace Other Brace: No brace ordered, LSO present in room and pt reports Dr. Wynetta Emery had her get one prior to surgery. Restrictions Weight Bearing Restrictions: No General  Pain 8/10 pain at rest, premedicated. Rest and positioning provided as needed. Pt reports feeling much better when seated OOB.  Pain Interference Pain Interference Pain Effect on Sleep: 1. Rarely or not at all Pain Interference with Therapy Activities: 1. Rarely or not at all Pain Interference with Day-to-Day Activities: 3. Frequently Home Living/Prior Functioning Home Living Available Help at Discharge: Friend(s);Available PRN/intermittently Type of Home: Apartment Home Access: Stairs to enter Entrance Stairs-Number  of Steps: 3 Entrance Stairs-Rails: Left Home Layout: One level Bathroom Shower/Tub: Tub/shower unit;Door (No grab gars or long-handled shower head) Bathroom Toilet: Standard  Lives With: Daughter Prior Function Level of Independence: Requires assistive device for independence;Needs assistance with ADLs;Independent with transfers;Independent with gait (Completely independent prior to March 2024, then used rollator/RW.) Bath: Minimal (Daughter assisting with bathing bilateral feet and/or back.) Driving: No Vocation: On disability Vision/Perception  Vision - History Ability to See in Adequate Light: 0 Adequate Perception Perception: Within Functional Limits Praxis Praxis: WFL  Cognition Overall Cognitive Status: Within Functional Limits for tasks assessed Arousal/Alertness: Awake/alert Orientation Level: Oriented X4 Memory: Appears intact Awareness: Appears intact Problem Solving: Appears intact Safety/Judgment: Appears intact Sensation Sensation Light Touch: Impaired by gross assessment Peripheral sensation comments: Numbness/pressure in LLE, some numbness/tingling in RLE; Light Touch Impaired Details: Impaired RLE;Impaired LLE Hot/Cold: Appears Intact Proprioception: Appears Intact Coordination Gross Motor Movements are Fluid and Coordinated: No Fine Motor Movements are Fluid and Coordinated: Yes Coordination and Movement Description: Deficits due to B foot drop & increased surgical pain. Motor  Motor Motor: Other (comment) Motor - Skilled Clinical Observations: muscle weakness, BIL foot drop   Trunk/Postural Assessment  Cervical Assessment Cervical Assessment: Within Functional Limits Thoracic Assessment Thoracic Assessment: Within Functional Limits Lumbar Assessment Lumbar Assessment: Exceptions to [facility] (back precautions) Postural Control Postural Control: Within Functional Limits  Balance Balance Balance Assessed: Yes Static Sitting Balance Static Sitting -  Balance Support: Feet supported Static Sitting - Level of Assistance: 5: Stand by assistance Dynamic Sitting Balance Dynamic Sitting - Balance Support: Bilateral upper extremity supported Dynamic Sitting - Level of Assistance: 5: Stand by assistance Dynamic Sitting - Balance Activities: Forward lean/weight shifting;Reaching for Higher education careers adviser Standing - Balance Support: Bilateral upper extremity supported Static Standing - Level of Assistance: 5: Stand by assistance Dynamic Standing Balance Dynamic Standing - Balance Support: During functional activity Dynamic Standing - Level of Assistance: 4: Min assist Dynamic Standing - Balance Activities: Lateral lean/weight shifting;Reaching for objects Extremity Assessment  RUE Assessment RUE Assessment: Exceptions to [facility] Active Range of Motion (AROM) Comments: Painful LUE Assessment LUE Assessment: Exceptions  to [facility] Active Range of Motion (AROM) Comments: Painful RLE Assessment RLE Assessment: Exceptions to [facility] RLE Strength Right Hip Flexion: 3-/5 Right Knee Flexion: 4-/5 Right Knee Extension: 4-/5 Right Ankle Dorsiflexion: 1/5 Right Ankle Plantar Flexion: 3/5 LLE Assessment LLE Assessment: Exceptions to [facility] LLE Strength Left Hip Flexion: 3-/5 Left Knee Flexion: 3/5 Left Knee Extension: 3+/5 Left Ankle Dorsiflexion: 0/5 Left Ankle Plantar Flexion: 3/5  Care Tool Care Tool Bed Mobility Roll left and right activity   Roll left and right assist level: Maximal Assistance - Patient 25 - 49%    Sit to lying activity   Sit to lying assist level: Maximal Assistance - Patient 25 - 49%    Lying to sitting on side of bed activity   Lying to sitting on side of bed assist level: the ability to move from lying on the back to sitting on the side of the bed with no back support.: Maximal Assistance - Patient 25 - 49%     Care Tool Transfers Sit to stand transfer   Sit to stand assist level: Minimal Assistance -  Patient > 75%    Chair/bed transfer   Chair/bed transfer assist level: Minimal Assistance - Patient > 75%    Car transfer Car transfer activity did not occur: Safety/medical concerns        Care Tool Locomotion Ambulation   Assist level: Minimal Assistance - Patient > 75% Assistive device: Walker-rolling Max distance: 20 ft  Walk 10 feet activity   Assist level: Contact Guard/Touching assist Assistive device: Walker-rolling   Walk 50 feet with 2 turns activity Walk 50 feet with 2 turns activity did not occur: Safety/medical concerns      Walk 150 feet activity Walk 150 feet activity did not occur: Safety/medical concerns      Walk 10 feet on uneven surfaces activity Walk 10 feet on uneven surfaces activity did not occur: Safety/medical concerns      Stairs Stair activity did not occur: Safety/medical concerns        Walk up/down 1 step activity Walk up/down 1 step or curb (drop down) activity did not occur: Safety/medical concerns      Walk up/down 4 steps activity Walk up/down 4 steps activity did not occur: Safety/medical concerns      Walk up/down 12 steps activity Walk up/down 12 steps activity did not occur: Safety/medical concerns      Pick up small objects from floor Pick up small object from the floor (from standing position) activity did not occur: Safety/medical concerns      Wheelchair Is the patient using a wheelchair?: Yes Type of Wheelchair: Manual Wheelchair activity did not occur: N/A      Wheel 50 feet with 2 turns activity Wheelchair 50 feet with 2 turns activity did not occur: N/A    Wheel 150 feet activity Wheelchair 150 feet activity did not occur: N/A      Refer to Care Plan for Long Term Goals  SHORT TERM GOAL WEEK 1 PT Short Term Goal 1 (Week 1): pt will perform bed mobiltiy with min a PT Short Term Goal 2 (Week 1): Pt will ambulate 100 ft with assist PT Short Term Goal 3 (Week 1): Pt will navigate 3" stairs with  assist  Recommendations for other services: Neuropsych and Therapeutic Recreation  Pet therapy, Stress management, and Outing/community reintegration  Skilled Therapeutic Intervention Evaluation completed (see details above) with patient education regarding purpose of PT evaluation, PT POC and goals, therapy schedule, weekly team  meetings, and other CIR information including safety plan and fall risk safety. Pt reports 8/10 pain, premedicated. Rest and positioning provided as needed. Reports pain improved once sitting OOB. Pt performed the below functional mobility tasks with the specified levels of skilled cuing and assistance.  Ambulated to bathroom with min A. Continent bladder void. Pt also stood at sink for hand hygiene and oral care, no knee buckling noted. Pt oriented to rehab unit and remained in w/c, was left with all needs in reach and alarm active.   Mobility Bed Mobility Bed Mobility: Supine to Sit;Sit to Supine Supine to Sit: Maximal Assistance - Patient - Patient 25-49% Sit to Supine: Maximal Assistance - Patient 25-49% Transfers Transfers: Sit to Stand;Stand to Sit Sit to Stand: Minimal Assistance - Patient > 75% Stand to Sit: Minimal Assistance - Patient > 75% Transfer (Assistive device): Rolling walker Locomotion  Gait Ambulation: Yes Gait Assistance: Minimal Assistance - Patient > 75% Gait Distance (Feet): 20 Feet Assistive device: Rolling walker Gait Assistance Details: Verbal cues for precautions/safety;Verbal cues for safe use of DME/AE Gait Gait: Yes Gait Pattern: Right steppage;Left steppage;Decreased dorsiflexion - right;Decreased dorsiflexion - left;Decreased stride length Gait velocity: dec Stairs / Additional Locomotion Stairs: No Wheelchair Mobility Wheelchair Mobility: No   Discharge Criteria: Patient will be discharged from PT if patient refuses treatment 3 consecutive times without medical reason, if treatment goals not met, if there is a change in  medical status, if patient makes no progress towards goals or if patient is discharged from hospital.  The above assessment, treatment plan, treatment alternatives and goals were discussed and mutually agreed upon: by patient  [name] 03/08/2023, 12:49 PM

## 2023-03-08 NOTE — Evaluation (Signed)
Occupational Therapy Assessment and Plan  Patient Details  Name: Jill Holt MRN: [id number] Date of Birth: 1971/02/18  OT Diagnosis: acute pain, lumbago (low back pain), muscle weakness (generalized), and decreased activity tolerance Rehab Potential: Rehab Potential (ACUTE ONLY): Good ELOS: 12-14 days   Today's Date: 03/08/2023 OT Individual Time: 7829-5621 OT Individual Time Calculation (min): 67 min     Hospital Problem: Principal Problem:   Lumbar radiculopathy   Past Medical History:  Past Medical History:  Diagnosis Date   Acute urinary retention    Anemia    Iron Deficiency Anemia   Arthritis    Constipation, acute    s/p each surgery   COVID 09/2018   had pneumonia   Diabetes mellitus without complication (HCC)    GERD (gastroesophageal reflux disease)    Hypertension    Pneumonia    Past Surgical History:  Past Surgical History:  Procedure Laterality Date   CARPAL TUNNEL RELEASE Bilateral    FEMUR FRACTURE SURGERY Left    ganglion cyst removal Right    Hand   LUMBAR LAMINECTOMY/DECOMPRESSION MICRODISCECTOMY Left 07/24/2022   Procedure: Sublaminar decompression - left - Lumbar Three-Lumbar Four - Lumbar Four-Lumbar Five;  Surgeon: Donalee Citrin, MD;  Location: [facility] OR;  Service: Neurosurgery;  Laterality: Left;  3C   TUBAL LIGATION     1990s    Assessment & Plan Clinical Impression: Patient is a 52 year old right-handed female with history of hypertension, diabetes mellitus, iron deficiency anemia as well as history of lumbar L3-4 and L4-5 laminotomies. Per chart review patient lives with her children. 1 level home 3 steps to entry. She has using a rolling walker since March due to bilateral foot drop. She essentially sponge bathes for her ADLs. Presented 03/03/2023 with bilateral hip and leg pain. Workup and imaging revealed degenerative disc disease spinal stenosis L3-4 and 4-5. Underwent redo decompressive laminectomies L3-4 and L4-L5 with complete facetectomies  and radical foraminotomies of L3-4-5 nerve roots bilaterally by Dr. Wynetta Emery 03/03/2023 as well as transforaminal lumbar interbody fusions L3-4 and L5 with screw fixation and posterior lateral arthrodesis. SCDs in place for DVT prophylaxis. No postoperative orders were given for a brace. Patient transferred to CIR on 03/07/2023 .    Patient currently requires Min-Mod A with basic self-care skills secondary to muscle weakness, decreased cardiorespiratoy endurance, unbalanced muscle activation and decreased coordination, decreased BLE sensation, and decreased sitting balance, decreased standing balance, and decreased balance strategies.  Prior to hospitalization, patient could complete BADLs with light Min A-Mod I.   Patient will benefit from skilled intervention to decrease level of assist with basic self-care skills and increase independence with basic self-care skills prior to discharge home with care partner.  Anticipate patient will require intermittent supervision and follow up home health.  OT - End of Session Activity Tolerance: Tolerates 10 - 20 min activity with multiple rests Endurance Deficit: Yes OT Assessment Rehab Potential (ACUTE ONLY): Good OT Barriers to Discharge: Decreased caregiver support;Wound Care;Lack of/limited family support;Weight OT Patient demonstrates impairments in the following area(s): Balance;Endurance;Motor;Pain;Perception;Safety;Sensory OT Basic ADL's Functional Problem(s): Bathing;Dressing;Toileting OT Advanced ADL's Functional Problem(s): Simple Meal Preparation OT Transfers Functional Problem(s): Toilet;Tub/Shower OT Plan OT Intensity: Minimum of 1-2 x/day, 45 to 90 minutes OT Frequency: 5 out of 7 days OT Duration/Estimated Length of Stay: 12-14 days OT Treatment/Interventions: Metallurgist training;Community reintegration;Discharge planning;DME/adaptive equipment instruction;Disease mangement/prevention;Functional mobility training;Functional electrical  stimulation;Neuromuscular re-education;Pain management;Patient/family education;Psychosocial support;Skin care/wound managment;Self Care/advanced ADL retraining;Splinting/orthotics;Therapeutic Activities;Therapeutic Exercise;UE/LE Strength taining/ROM;Wheelchair propulsion/positioning;Visual/perceptual remediation/compensation;UE/LE Freight forwarder Self-Care Anticipated  Outcome(s): Mod I OT Toileting Anticipated Outcome(s): Mod I OT Bathroom Transfers Anticipated Outcome(s): Mod I OT Recommendation Patient destination: Home Follow Up Recommendations: Home health OT Equipment Recommended: To be determined   OT Evaluation Precautions/Restrictions  Precautions Precautions: Back Precaution Comments: Pt able to recall 3/3 precautions. Required Braces or Orthoses: Other Brace Other Brace: No brace ordered, LSO present in room and pt reports Dr. Wynetta Emery had her get one prior to surgery. Restrictions Weight Bearing Restrictions: No General Chart Reviewed: Yes Family/Caregiver Present: No Vital Signs   Pain Pain Assessment Pain Scale: 0-10 Pain Score: 2  Pain Type: Acute pain Pain Location: Foot Pain Orientation: Left Pain Descriptors / Indicators: Pressure Pain Onset: On-going Pain Intervention(s): Rest Home Living/Prior Functioning Home Living Family/patient expects to be discharged to:: Private residence Living Arrangements: Children, Alone Available Help at Discharge: Friend(s), Available PRN/intermittently Type of Home: Apartment Home Access: Stairs to enter Secretary/administrator of Steps: 3 Entrance Stairs-Rails: Left Home Layout: One level Bathroom Shower/Tub: Tub/shower unit, Door (No grab gars or long-handled shower head) Firefighter: Standard  Lives With: Daughter IADL History Homemaking Responsibilities: Yes Meal Prep Responsibility: Secondary Prior Function Level of Independence: Requires assistive device for independence, Needs assistance with  ADLs, Independent with transfers, Independent with gait (Completely independent prior to March 2024, then used rollator/RW.) Bath: Minimal (Daughter assisting with bathing bilateral feet and/or back.)  Able to Take Stairs?: Yes Driving: No Vocation: On disability Vision Baseline Vision/History: 1 Wears glasses Ability to See in Adequate Light: 0 Adequate Patient Visual Report: No change from baseline Vision Assessment?: No apparent visual deficits Perception  Perception: Within Functional Limits Praxis Praxis: WFL Cognition Cognition Overall Cognitive Status: Within Functional Limits for tasks assessed Arousal/Alertness: Awake/alert Orientation Level: Person;Place;Situation Memory: Appears intact Awareness: Appears intact Problem Solving: Appears intact Safety/Judgment: Appears intact Brief Interview for Mental Status (BIMS) Repetition of Three Words (First Attempt): 3 Temporal Orientation: Year: Correct Temporal Orientation: Month: Accurate within 5 days Temporal Orientation: Day: Correct Recall: "Sock": Yes, no cue required Recall: "Blue": Yes, no cue required Recall: "Bed": Yes, after cueing ("a piece of furniture") BIMS Summary Score: 14 Sensation Sensation Light Touch: Impaired by gross assessment Peripheral sensation comments: Numbness/pressure in LLE, some numbness/tingling in RLE; Light Touch Impaired Details: Impaired RLE;Impaired LLE Hot/Cold: Appears Intact Proprioception: Appears Intact Coordination Gross Motor Movements are Fluid and Coordinated: No Fine Motor Movements are Fluid and Coordinated: Yes Coordination and Movement Description: Deficits due to B foot drop & increased surgical pain. Motor  Motor Motor: Other (comment) (Bilateral foot drop.)  Trunk/Postural Assessment  Cervical Assessment Cervical Assessment: Within Functional Limits Thoracic Assessment Thoracic Assessment: Within Functional Limits Lumbar Assessment Lumbar Assessment:  Exceptions to [facility] (Limited by decompression site and back precautions.) Postural Control Postural Control: Within Functional Limits  Balance Balance Balance Assessed: Yes Static Sitting Balance Static Sitting - Balance Support: Feet supported Static Sitting - Level of Assistance: 5: Stand by assistance Dynamic Sitting Balance Dynamic Sitting - Balance Support: Bilateral upper extremity supported (For pain management.) Dynamic Sitting - Level of Assistance: 5: Stand by assistance Dynamic Sitting - Balance Activities: Forward lean/weight shifting;Reaching for objects Static Standing Balance Static Standing - Balance Support: Bilateral upper extremity supported Static Standing - Level of Assistance: 5: Stand by assistance (CGA) Dynamic Standing Balance Dynamic Standing - Balance Support: During functional activity Dynamic Standing - Level of Assistance: 4: Min assist Dynamic Standing - Balance Activities: Lateral lean/weight shifting;Reaching for objects Extremity/Trunk Assessment RUE Assessment RUE Assessment: Exceptions to [facility] Active  Range of Motion (AROM) Comments: Painful LUE Assessment LUE Assessment: Exceptions to [facility] Active Range of Motion (AROM) Comments: Painful  Care Tool Care Tool Self Care Eating   Eating Assist Level: Independent    Oral Care    Oral Care Assist Level: Set up assist    Bathing   Body parts bathed by patient: Right arm;Left arm;Chest;Abdomen;Front perineal area;Face;Right upper leg;Left upper leg Body parts bathed by helper: Right lower leg;Left lower leg;Buttocks   Assist Level: Moderate Assistance - Patient 50 - 74%    Upper Body Dressing(including orthotics)   What is the patient wearing?: Hospital gown only   Assist Level: Set up assist    Lower Body Dressing (excluding footwear)   What is the patient wearing?: Hospital gown only Assist for lower body dressing: Set up assist    Putting on/Taking off footwear   What is the patient  wearing?: Non-skid slipper socks Assist for footwear: Dependent - Patient 0%       Care Tool Toileting Toileting activity   Assist for toileting: Contact Guard/Touching assist     Care Tool Bed Mobility Roll left and right activity    Defer to PT Eval    Sit to lying activity    Defer to PT Eval    Lying to sitting on side of bed activity    Defer to PT Eval     Care Tool Transfers Sit to stand transfer    Defer to PT Eval    Chair/bed transfer    Defer to PT Eval     Toilet transfer   Assist Level: Minimal Assistance - Patient > 75%     Care Tool Cognition  Expression of Ideas and Wants Expression of Ideas and Wants: 4. Without difficulty (complex and basic) - expresses complex messages without difficulty and with speech that is clear and easy to understand  Understanding Verbal and Non-Verbal Content Understanding Verbal and Non-Verbal Content: 4. Understands (complex and basic) - clear comprehension without cues or repetitions   Memory/Recall Ability Memory/Recall Ability : Current season;That he or she is in a hospital/hospital unit   Refer to Care Plan for Long Term Goals  SHORT TERM GOAL WEEK 1 OT Short Term Goal 1 (Week 1): Pt will complete LB bathing with Min A + LRAD. OT Short Term Goal 2 (Week 1): Pt will complete LB dressing with Min A + LRAD. OT Short Term Goal 3 (Week 1): Pt will verbalized at least 2 alternative pain management strategies outside of medication.  Recommendations for other services: None    Skilled Therapeutic Intervention Pt greeted sitting in [facility] for skilled OT session with focus on comprehensive OT evaluation.   Pain: Pain details above. OT offering intermediate rest breaks and positioning suggestions throughout session to address pain/fatigue and maximize participation/safety in session.   Session began with introduction to OT role, OT POC, and general orientation to rehab unit/schedule. Pt completes sponge-bathing and toileting with  levels of assistance noted below.   Visual education provided on purpose/use of long-handled sponge, reacher, sock-aid, and female urinal.   Pt remained sitting in WC with 4Ps assessed and immediate needs met. Pt continues to be appropriate for skilled OT intervention to promote further functional independence in ADLs/IADLs.   ADL ADL Eating: Independent Where Assessed-Eating: Wheelchair Grooming: Setup;Supervision/safety Where Assessed-Grooming: Sitting at sink Upper Body Bathing: Supervision/safety;Setup Where Assessed-Upper Body Bathing: Sitting at sink;Wheelchair Lower Body Bathing: Moderate assistance Where Assessed-Lower Body Bathing: Sitting at sink;Wheelchair Upper Body Dressing: Contact  guard Where Assessed-Upper Body Dressing: Sitting at sink;Wheelchair Lower Body Dressing: Moderate assistance Where Assessed-Lower Body Dressing: Wheelchair Toileting: Contact guard Where Assessed-Toileting: Toilet;Bedside Commode Toilet Transfer: Minimal assistance Toilet Transfer Method: Proofreader: Bedside commode;Grab bars (RW) Tub/Shower Transfer: Unable to assess Film/video editor: Unable to assess Mobility  Transfers Sit to Stand: Contact Guard/Touching assist Stand to Sit: Minimal Assistance - Patient > 75%   Discharge Criteria: Patient will be discharged from OT if patient refuses treatment 3 consecutive times without medical reason, if treatment goals not met, if there is a change in medical status, if patient makes no progress towards goals or if patient is discharged from hospital.  The above assessment, treatment plan, treatment alternatives and goals were discussed and mutually agreed upon: by patient  Lou Cal, OTR/L, MSOT  03/08/2023, 12:24 PM

## 2023-03-08 NOTE — Progress Notes (Signed)
Physical Therapy Session Note  Patient Details  Name: Jill Holt MRN: [id number] Date of Birth: [birth year]  Today's Date: 03/08/2023 PT Individual Time: 1415-1530 PT Individual Time Calculation (min): 75 min   Short Term Goals: Week 1:  PT Short Term Goal 1 (Week 1): pt will perform bed mobiltiy with min a PT Short Term Goal 2 (Week 1): Pt will ambulate 100 ft with assist PT Short Term Goal 3 (Week 1): Pt will navigate 3" stairs with assist  Skilled Therapeutic Interventions/Progress Updates:  Pt seated in w/c on arrival and agreeable to therapy. Pt reports unrated pain with mobility, worst with sit/stand. premedicated. Rest and positioning provided as needed.   ambulatory transfer to bathroom for continent bladder void, documented in flow sheet. CGA for 3/3 toileting tasks. Pt ambulated to sink for hand hygiene with min A, cues for incr step height. Supervision in standing, but note sinking into knee flexion with time.   Pt propelled w/c with BUE to gym for endurance and functional mobility. Therapist retrieved and set up AFOs and donned ted hose d/t mild LE edema. Donned shoes/AFOs tot A. Gait training with AFOS and RW x 132 ft with min A overall. Cues for incr step height via hip and knee flexion. Would benefit from hip and knee flexion strength training for improved gait mechanics.   Pt able to navigate 6" step with BIL hand rails and min a, ascending with LLE first. Pt was unable to perform adequate hip flexion to set RLE on step. Pt the performed car transfer with max a for LE management.  Returned to room and to bed with max a, was left with all needs in reach and alarm active.   Therapy Documentation Precautions:  Precautions Precautions: Back Precaution Comments: Pt able to recall 3/3 precautions. Required Braces or Orthoses: Other Brace Other Brace: No brace ordered, LSO present in room and pt reports Dr. Wynetta Emery had her get one prior to surgery. Restrictions Weight Bearing  Restrictions: No General:       Therapy/Group: Individual Therapy  [name] 03/08/2023, 3:40 PM

## 2023-03-08 NOTE — Plan of Care (Signed)
  Problem: RH Balance Goal: LTG Patient will maintain dynamic standing with ADLs (OT) Description: LTG:  Patient will maintain dynamic standing balance with assist during activities of daily living (OT)  Flowsheets (Taken 03/08/2023 1237) LTG: Pt will maintain dynamic standing balance during ADLs with: Independent with assistive device   Problem: RH Bathing Goal: LTG Patient will bathe all body parts with assist levels (OT) Description: LTG: Patient will bathe all body parts with assist levels (OT) Flowsheets (Taken 03/08/2023 1237) LTG: Pt will perform bathing with assistance level/cueing: Independent with assistive device    Problem: RH Dressing Goal: LTG Patient will perform upper body dressing (OT) Description: LTG Patient will perform upper body dressing with assist, with/without cues (OT). Flowsheets (Taken 03/08/2023 1237) LTG: Pt will perform upper body dressing with assistance level of: Independent with assistive device Goal: LTG Patient will perform lower body dressing w/assist (OT) Description: LTG: Patient will perform lower body dressing with assist, with/without cues in positioning using equipment (OT) Flowsheets (Taken 03/08/2023 1237) LTG: Pt will perform lower body dressing with assistance level of: Independent with assistive device   Problem: RH Toileting Goal: LTG Patient will perform toileting task (3/3 steps) with assistance level (OT) Description: LTG: Patient will perform toileting task (3/3 steps) with assistance level (OT)  Flowsheets (Taken 03/08/2023 1237) LTG: Pt will perform toileting task (3/3 steps) with assistance level: Independent with assistive device   Problem: RH Simple Meal Prep Goal: LTG Patient will perform simple meal prep w/assist (OT) Description: LTG: Patient will perform simple meal prep with assistance, with/without cues (OT). Flowsheets (Taken 03/08/2023 1237) LTG: Pt will perform simple meal prep with assistance level of: Independent with  assistive device   Problem: RH Toilet Transfers Goal: LTG Patient will perform toilet transfers w/assist (OT) Description: LTG: Patient will perform toilet transfers with assist, with/without cues using equipment (OT) Flowsheets (Taken 03/08/2023 1237) LTG: Pt will perform toilet transfers with assistance level of: Independent with assistive device   Problem: RH Tub/Shower Transfers Goal: LTG Patient will perform tub/shower transfers w/assist (OT) Description: LTG: Patient will perform tub/shower transfers with assist, with/without cues using equipment (OT) Flowsheets (Taken 03/08/2023 1237) LTG: Pt will perform tub/shower stall transfers with assistance level of: Independent with assistive device

## 2023-03-08 NOTE — Plan of Care (Signed)
  Problem: Consults Goal: RH SPINAL CORD INJURY PATIENT EDUCATION Description:  See Patient Education module for education specifics.  Outcome: Progressing   Problem: SCI BOWEL ELIMINATION Goal: RH STG MANAGE BOWEL WITH ASSISTANCE Description: STG Manage Bowel with min Assistance. Outcome: Progressing Goal: RH STG SCI MANAGE BOWEL WITH MEDICATION WITH ASSISTANCE Description: STG SCI Manage bowel with medication with min assistance. Outcome: Progressing Goal: RH STG SCI MANAGE BOWEL PROGRAM W/ASSIST OR AS APPROPRIATE Description: STG SCI Manage bowel program w/min assist or as appropriate. Outcome: Progressing   Problem: SCI BLADDER ELIMINATION Goal: RH STG MANAGE BLADDER WITH ASSISTANCE Description: STG Manage Bladder With min Assistance Outcome: Progressing Goal: RH STG MANAGE BLADDER WITH MEDICATION WITH ASSISTANCE Description: STG Manage Bladder With Medication With min Assistance. Outcome: Progressing   Problem: RH SKIN INTEGRITY Goal: RH STG SKIN FREE OF INFECTION/BREAKDOWN Description: Incision will remain intact and be free of infection/breakdown with min assist  Outcome: Progressing Goal: RH STG MAINTAIN SKIN INTEGRITY WITH ASSISTANCE Description: STG Maintain Skin Integrity With min Assistance. Outcome: Progressing Goal: RH STG ABLE TO PERFORM INCISION/WOUND CARE W/ASSISTANCE Description: STG Able To Perform Incision/Wound Care With min Assistance. Outcome: Progressing   Problem: RH SAFETY Goal: RH STG ADHERE TO SAFETY PRECAUTIONS W/ASSISTANCE/DEVICE Description: STG Adhere to Safety Precautions With min Assistance/Device. Outcome: Progressing Goal: RH STG DECREASED RISK OF FALL WITH ASSISTANCE Description: STG Decreased Risk of Fall With cueing Assistance. Outcome: Progressing   Problem: RH PAIN MANAGEMENT Goal: RH STG PAIN MANAGED AT OR BELOW PT'S PAIN GOAL Description: Less than 4 with PRN medications min assist  Outcome: Progressing   Problem: RH KNOWLEDGE  DEFICIT SCI Goal: RH STG INCREASE KNOWLEDGE OF SELF CARE AFTER SCI Description: Patient will be able to manage medications, bowels, and diet/ lifestyle modifications to improve A1C of 7.8 from nursing education and nursing handouts independently  Outcome: Progressing

## 2023-03-08 NOTE — Progress Notes (Signed)
PROGRESS NOTE   Subjective/Complaints:  Labile feels overwhelmed by medical issues + constipation , usually has problems on opioids, denied opioids prior to surgery   ROS- denies Abd pain , N/V   Objective:   No results found. Recent Labs    03/08/23 0521  WBC 6.7  HGB 8.5*  HCT 27.2*  PLT 218   Recent Labs    03/08/23 0521  NA 138  K 3.8  CL 98  CO2 29  GLUCOSE 104*  BUN 13  CREATININE 0.88  CALCIUM 8.9   No intake or output data in the 24 hours ending 03/08/23 0656      Physical Exam: Vital Signs Blood pressure 131/80, pulse (!) 103, temperature 98.9 F (37.2 C), temperature source Oral, resp. rate 19, height 5\' 4"  (1.626 m), weight 90.6 kg, last menstrual period 05/31/2022, SpO2 92%.   General: No acute distress Mood and affect are appropriate Heart: Regular rate and rhythm no rubs murmurs or extra sounds Lungs: Clear to auscultation, breathing unlabored, no rales or wheezes Abdomen: Positive bowel sounds, soft nontender to palpation, nondistended Extremities: No clubbing, cyanosis, or edema Skin: No evidence of breakdown, no evidence of rash Neurologic: Cranial nerves II through XII intact, motor strength is 5/5 in bilateral deltoid, bicep, tricep, grip, Pain limits MMT to 2- hip flexor, knee extensors, ankle dorsiflexor and plantar flexor Sensory exam hypersensitive to LT L5-S1 , intact LT L2-S1  Musculoskeletal: Full range of motion in all 4 extremities. No joint swelling   Assessment/Plan: 1. Functional deficits which require 3+ hours per day of interdisciplinary therapy in a comprehensive inpatient rehab setting. Physiatrist is providing close team supervision and 24 hour management of active medical problems listed below. Physiatrist and rehab team continue to assess barriers to discharge/monitor patient progress toward functional and medical goals  Care Tool:  Bathing               Bathing assist       Upper Body Dressing/Undressing Upper body dressing        Upper body assist      Lower Body Dressing/Undressing Lower body dressing            Lower body assist       Toileting Toileting    Toileting assist       Transfers Chair/bed transfer  Transfers assist           Locomotion Ambulation   Ambulation assist              Walk 10 feet activity   Assist           Walk 50 feet activity   Assist           Walk 150 feet activity   Assist           Walk 10 feet on uneven surface  activity   Assist           Wheelchair     Assist               Wheelchair 50 feet with 2 turns activity    Assist  Wheelchair 150 feet activity     Assist          Blood pressure 131/80, pulse (!) 103, temperature 98.9 F (37.2 C), temperature source Oral, resp. rate 19, height 5\' 4"  (1.626 m), weight 90.6 kg, last menstrual period 05/31/2022, SpO2 92%.  Medical Problem List and Plan: 1. 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  Status post redo decompressive laminectomies L3-4 and L4-5 with complete facetectomies and radical foraminotomies , L3-03/03/2023 LE weakness is in part due to pain, will optimize regimen              -patient may shower             -ELOS/Goals: 8-11 days with mod I goals PT, OT             -PRAFO's for both LE's at night             -will need AFO's for gait 2.  Antithrombotics: -DVT/anticoagulation:  Mechanical:  Antiembolism stockings, knee (TED hose) Bilateral lower extremities.  Check vascular study.  Discussed with neurosurgery on beginning Lovenox             -antiplatelet therapy: N/A 3. Pain Management: currently on 600 mg twice daily, Flexeril and hydrocodone as needed             -pt was on gabapentin 900mg  tid at home--increase to 600mg  tid to start 4.  Mood/Behavior/Sleep: Provide emotional support- labile this am , "feels overwhelmed by medical issues "             -antipsychotic agents: N/A 5. Neuropsych/cognition: This patient is capable of making decisions on her own behalf. 6. Skin/Wound Care: Routine skin checks 7. Fluids/Electrolytes/Nutrition: Routine in and outs with follow-up chemistries 8.  Hypertension.  Norvasc 10 mg daily, Cozaar 100 mg daily, hydrochlorothiazide 12.5 mg daily, Toprol-XL 25 mg daily.  Monitor with increased mobility Vitals:   03/07/23 1915 03/08/23 0504  BP: (!) 151/86 131/80  Pulse: (!) 110 (!) 103  Resp: 18 19  Temp: 99.7 F (37.6 C) 98.9 F (37.2 C)  SpO2: 95% 92%    9.  Diabetes mellitus.  Hemoglobin A1c 7.8.  NovoLog Mix 70/30 35 units with breakfast, 25 units with supper, Glucotrol XL 10 mg daily. CBG (last 3)  Recent Labs    03/07/23 1213 03/07/23 1806 03/08/23 0608  GLUCAP 181* 171* 98    10.  Hyperlipidemia.  Lipitor 11.  Constipation.  Colace twice daily, MiraLAX daily 12. Mild hypoxia:             -wean oxygen as able             -check CBC in am 13. C/o dizziness while up with therapy. Sinus tachy on exam             -CBC as above, check BMET as well             -encourage fluids     14.  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

## 2023-03-08 NOTE — Progress Notes (Signed)
Inpatient Rehabilitation Admission Medication Review by a Pharmacist  A complete drug regimen review was completed for this patient to identify any potential clinically significant medication issues.  High Risk Drug Classes Is patient taking? Indication by Medication  Antipsychotic No   Anticoagulant No   Antibiotic No   Opioid Yes Norco - pain   Antiplatelet No   Hypoglycemics/insulin Yes Glipizide, humulin 70/30 - DM  Vasoactive Medication Yes Norvasc, losartan, hydrochlorothiazide, toprol - HTN  Chemotherapy No   Other Yes Tylenol - pain  Flexeril - muscle spasms  Lipitor - HLD Gabapentin - Neuropathic pain  Omeprazole - GERD Zofran - nausea/ vomiting  Miralax - constipation  Protonix - GERD     Type of Medication Issue Identified Description of Issue Recommendation(s)  Drug Interaction(s) (clinically significant)     Duplicate Therapy     Allergy     No Medication Administration End Date     Incorrect Dose     Additional Drug Therapy Needed     Significant med changes from prior encounter (inform family/care partners about these prior to discharge).    Other       Clinically significant medication issues were identified that warrant physician communication and completion of prescribed/recommended actions by midnight of the next day:  No  Name of provider notified for urgent issues identified:   Provider Method of Notification:     Pharmacist comments:   Time spent performing this drug regimen review (minutes):  20 minutes   Toniann Fail Rudisill 03/07/2023 12:38 PM

## 2023-03-09 ENCOUNTER — Inpatient Hospital Stay (HOSPITAL_COMMUNITY): Payer: Medicaid Other

## 2023-03-09 DIAGNOSIS — G8918 Other acute postprocedural pain: Secondary | ICD-10-CM | POA: Diagnosis not present

## 2023-03-09 DIAGNOSIS — I10 Essential (primary) hypertension: Secondary | ICD-10-CM | POA: Diagnosis not present

## 2023-03-09 DIAGNOSIS — G8220 Paraplegia, unspecified: Secondary | ICD-10-CM | POA: Diagnosis not present

## 2023-03-09 DIAGNOSIS — M5416 Radiculopathy, lumbar region: Secondary | ICD-10-CM | POA: Diagnosis not present

## 2023-03-09 DIAGNOSIS — M7989 Other specified soft tissue disorders: Secondary | ICD-10-CM

## 2023-03-09 LAB — GLUCOSE, CAPILLARY: Glucose-Capillary: 34 mg/dL — CL (ref 70–99)

## 2023-03-09 MED ORDER — BISACODYL 10 MG RE SUPP
10.0000 mg | Freq: Once | RECTAL | Status: DC
  Filled 2023-03-09: qty 1

## 2023-03-09 NOTE — Plan of Care (Signed)
  Problem: Consults Goal: RH SPINAL CORD INJURY PATIENT EDUCATION Description:  See Patient Education module for education specifics.  Outcome: Progressing   Problem: SCI BOWEL ELIMINATION Goal: RH STG MANAGE BOWEL WITH ASSISTANCE Description: STG Manage Bowel with min Assistance. Outcome: Progressing Goal: RH STG SCI MANAGE BOWEL WITH MEDICATION WITH ASSISTANCE Description: STG SCI Manage bowel with medication with min assistance. Outcome: Progressing Goal: RH STG SCI MANAGE BOWEL PROGRAM W/ASSIST OR AS APPROPRIATE Description: STG SCI Manage bowel program w/min assist or as appropriate. Outcome: Progressing   Problem: SCI BLADDER ELIMINATION Goal: RH STG MANAGE BLADDER WITH ASSISTANCE Description: STG Manage Bladder With min Assistance Outcome: Progressing Goal: RH STG MANAGE BLADDER WITH MEDICATION WITH ASSISTANCE Description: STG Manage Bladder With Medication With min Assistance. Outcome: Progressing   Problem: RH SKIN INTEGRITY Goal: RH STG SKIN FREE OF INFECTION/BREAKDOWN Description: Incision will remain intact and be free of infection/breakdown with min assist  Outcome: Progressing Goal: RH STG MAINTAIN SKIN INTEGRITY WITH ASSISTANCE Description: STG Maintain Skin Integrity With min Assistance. Outcome: Progressing Goal: RH STG ABLE TO PERFORM INCISION/WOUND CARE W/ASSISTANCE Description: STG Able To Perform Incision/Wound Care With min Assistance. Outcome: Progressing   Problem: RH SAFETY Goal: RH STG ADHERE TO SAFETY PRECAUTIONS W/ASSISTANCE/DEVICE Description: STG Adhere to Safety Precautions With min Assistance/Device. Outcome: Progressing Goal: RH STG DECREASED RISK OF FALL WITH ASSISTANCE Description: STG Decreased Risk of Fall With cueing Assistance. Outcome: Progressing   Problem: RH PAIN MANAGEMENT Goal: RH STG PAIN MANAGED AT OR BELOW PT'S PAIN GOAL Description: Less than 4 with PRN medications min assist  Outcome: Progressing   Problem: RH KNOWLEDGE  DEFICIT SCI Goal: RH STG INCREASE KNOWLEDGE OF SELF CARE AFTER SCI Description: Patient will be able to manage medications, bowels, and diet/ lifestyle modifications to improve A1C of 7.8 from nursing education and nursing handouts independently  Outcome: Progressing

## 2023-03-09 NOTE — Progress Notes (Signed)
BLE venous duplex has been completed.    Results can be found under chart review under CV PROC. 03/09/2023 3:54 PM Shanda Cadotte RVT, RDMS

## 2023-03-09 NOTE — Progress Notes (Signed)
PROGRESS NOTE   Subjective/Complaints:  Labile feels overwhelmed by medical issues + constipation , usually has problems on opioids, denied opioids prior to surgery  Would like to mix miralax with prune juice  Ok with supp if needed    ROS- denies Abd pain , N/V , no breathing issues   Objective:   No results found. Recent Labs    03/08/23 0521  WBC 6.7  HGB 8.5*  HCT 27.2*  PLT 218   Recent Labs    03/08/23 0521  NA 138  K 3.8  CL 98  CO2 29  GLUCOSE 104*  BUN 13  CREATININE 0.88  CALCIUM 8.9    Intake/Output Summary (Last 24 hours) at 03/09/2023 0747 Last data filed at 03/08/2023 1831 Gross per 24 hour  Intake 240 ml  Output --  Net 240 ml        Physical Exam: Vital Signs Blood pressure 137/89, pulse 98, temperature 98.2 F (36.8 C), resp. rate 18, height 5\' 4"  (1.626 m), weight 90.6 kg, last menstrual period 05/31/2022, SpO2 95%.   General: No acute distress Mood and affect are appropriate Heart: Regular rate and rhythm no rubs murmurs or extra sounds Lungs: Clear to auscultation, breathing unlabored, no rales or wheezes Abdomen: Positive bowel sounds, soft nontender to palpation, nondistended Extremities: No clubbing, cyanosis, or edema Skin: No evidence of breakdown, no evidence of rash Neurologic: Cranial nerves II through XII intact, motor strength is 5/5 in bilateral deltoid, bicep, tricep, grip, Pain limits MMT to 2- hip flexor, knee extensors, ankle dorsiflexor and plantar flexor Sensory exam hypersensitive to LT L5-S1 , intact LT L2-S1  Musculoskeletal: Full range of motion in all 4 extremities. No joint swelling   Assessment/Plan: 1. Functional deficits which require 3+ hours per day of interdisciplinary therapy in a comprehensive inpatient rehab setting. Physiatrist is providing close team supervision and 24 hour management of active medical problems listed below. Physiatrist and  rehab team continue to assess barriers to discharge/monitor patient progress toward functional and medical goals  Care Tool:  Bathing    Body parts bathed by patient: Right arm, Left arm, Chest, Abdomen, Front perineal area, Face, Right upper leg, Left upper leg   Body parts bathed by helper: Right lower leg, Left lower leg, Buttocks     Bathing assist Assist Level: Moderate Assistance - Patient 50 - 74%     Upper Body Dressing/Undressing Upper body dressing   What is the patient wearing?: Hospital gown only    Upper body assist Assist Level: Set up assist    Lower Body Dressing/Undressing Lower body dressing      What is the patient wearing?: Hospital gown only     Lower body assist Assist for lower body dressing: Set up assist     Toileting Toileting    Toileting assist Assist for toileting: Contact Guard/Touching assist     Transfers Chair/bed transfer  Transfers assist     Chair/bed transfer assist level: Minimal Assistance - Patient > 75%     Locomotion Ambulation   Ambulation assist      Assist level: Minimal Assistance - Patient > 75% Assistive device: Walker-rolling Max distance: 132  Walk 10 feet activity   Assist     Assist level: Contact Guard/Touching assist Assistive device: Walker-rolling   Walk 50 feet activity   Assist Walk 50 feet with 2 turns activity did not occur: Safety/medical concerns         Walk 150 feet activity   Assist Walk 150 feet activity did not occur: Safety/medical concerns         Walk 10 feet on uneven surface  activity   Assist Walk 10 feet on uneven surfaces activity did not occur: Safety/medical concerns         Wheelchair     Assist Is the patient using a wheelchair?: Yes Type of Wheelchair: Manual Wheelchair activity did not occur: N/A         Wheelchair 50 feet with 2 turns activity    Assist    Wheelchair 50 feet with 2 turns activity did not occur: N/A        Wheelchair 150 feet activity     Assist  Wheelchair 150 feet activity did not occur: N/A       Blood pressure 137/89, pulse 98, temperature 98.2 F (36.8 C), resp. rate 18, height 5\' 4"  (1.626 m), weight 90.6 kg, last menstrual period 05/31/2022, SpO2 95%.  Medical Problem List and Plan: 1. 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  Status post redo decompressive laminectomies L3-4 and L4-5 with complete facetectomies and radical foraminotomies , L3-03/03/2023 LE weakness is in part due to pain, will optimize regimen              -patient may shower             -ELOS/Goals: 8-11 days with mod I goals PT, OT             -PRAFO's for both LE's at night             -will need AFO's for gait 2.  Antithrombotics: -DVT/anticoagulation:  Mechanical:  Antiembolism stockings, knee (TED hose) Bilateral lower extremities.  Check vascular study.  Discussed with neurosurgery on beginning Lovenox             -antiplatelet therapy: N/A 3. Pain Management: currently on 600 mg twice daily, Flexeril and hydrocodone as needed             -pt was on gabapentin 900mg  tid at home--increase to 600mg  tid to start 4. Mood/Behavior/Sleep: Provide emotional support- labile this am , "feels overwhelmed by medical issues "             -antipsychotic agents: N/A 5. Neuropsych/cognition: This patient is capable of making decisions on her own behalf. 6. Skin/Wound Care: Routine skin checks 7. Fluids/Electrolytes/Nutrition: Routine in and outs with follow-up chemistries 8.  Hypertension.  Norvasc 10 mg daily, Cozaar 100 mg daily, hydrochlorothiazide 12.5 mg daily, Toprol-XL 25 mg daily.  Monitor with increased mobility Vitals:   03/08/23 1936 03/09/23 0506  BP: 117/63 137/89  Pulse: (!) 104 98  Resp: 17 18  Temp: 99.6 F (37.6 C) 98.2 F (36.8 C)  SpO2: 96% 95%    9.  Diabetes mellitus.  Hemoglobin A1c 7.8.  NovoLog Mix 70/30 35  units with breakfast, 25 units with supper, Glucotrol XL 10 mg daily. CBG (last 3)  Recent Labs    03/07/23 1213 03/07/23 1806 03/08/23 0608  GLUCAP 181* 171* 98  Controlled 11/9  10.  Hyperlipidemia.  Lipitor 11.  Constipation.  Colace twice daily,  MiraLAX BID, have added Amitiza for OIC, give dulc supp this pm if no BM  12. Mild hypoxia:             -wean oxygen as able             -check CBC in am 13. C/o dizziness while up with therapy. Sinus tachy on exam             -CBC as above, check BMET as well             -encourage fluids     14.  Pt gives hx of leg length discrepency "shortening left good leg in the 1990s to even out the leg length"-  has Left fem IM rod - per sports med note earlier this year , no other records the shed light on this chronic issue  LOS: 2 days A FACE TO FACE EVALUATION WAS PERFORMED  Erick Colace 03/09/2023, 7:47 AM

## 2023-03-10 DIAGNOSIS — M5416 Radiculopathy, lumbar region: Secondary | ICD-10-CM | POA: Diagnosis not present

## 2023-03-10 LAB — CBC WITH DIFFERENTIAL/PLATELET
Abs Immature Granulocytes: 0.03 10*3/uL (ref 0.00–0.07)
Basophils Absolute: 0 10*3/uL (ref 0.0–0.1)
Basophils Relative: 1 %
Eosinophils Absolute: 0.2 10*3/uL (ref 0.0–0.5)
Eosinophils Relative: 2 %
HCT: 27.6 % — ABNORMAL LOW (ref 36.0–46.0)
Hemoglobin: 8.9 g/dL — ABNORMAL LOW (ref 12.0–15.0)
Immature Granulocytes: 0 %
Lymphocytes Relative: 11 %
Lymphs Abs: 0.9 10*3/uL (ref 0.7–4.0)
MCH: 24.4 pg — ABNORMAL LOW (ref 26.0–34.0)
MCHC: 32.2 g/dL (ref 30.0–36.0)
MCV: 75.6 fL — ABNORMAL LOW (ref 80.0–100.0)
Monocytes Absolute: 0.9 10*3/uL (ref 0.1–1.0)
Monocytes Relative: 12 %
Neutro Abs: 5.6 10*3/uL (ref 1.7–7.7)
Neutrophils Relative %: 74 %
Platelets: 252 10*3/uL (ref 150–400)
RBC: 3.65 MIL/uL — ABNORMAL LOW (ref 3.87–5.11)
RDW: 15.8 % — ABNORMAL HIGH (ref 11.5–15.5)
WBC: 7.5 10*3/uL (ref 4.0–10.5)
nRBC: 0 % (ref 0.0–0.2)

## 2023-03-10 LAB — COMPREHENSIVE METABOLIC PANEL
ALT: 11 U/L (ref 0–44)
AST: 14 U/L — ABNORMAL LOW (ref 15–41)
Albumin: 2.4 g/dL — ABNORMAL LOW (ref 3.5–5.0)
Alkaline Phosphatase: 46 U/L (ref 38–126)
Anion gap: 13 (ref 5–15)
BUN: 13 mg/dL (ref 6–20)
CO2: 25 mmol/L (ref 22–32)
Calcium: 8.6 mg/dL — ABNORMAL LOW (ref 8.9–10.3)
Chloride: 97 mmol/L — ABNORMAL LOW (ref 98–111)
Creatinine, Ser: 0.93 mg/dL (ref 0.44–1.00)
GFR, Estimated: >60 mL/min (ref >60)
Glucose, Bld: 124 mg/dL — ABNORMAL HIGH (ref 70–99)
Potassium: 4.2 mmol/L (ref 3.5–5.1)
Sodium: 135 mmol/L (ref 135–145)
Total Bilirubin: 0.7 mg/dL (ref <1.2)
Total Protein: 6 g/dL — ABNORMAL LOW (ref 6.5–8.1)

## 2023-03-10 LAB — GLUCOSE, CAPILLARY
Glucose-Capillary: 109 mg/dL — ABNORMAL HIGH (ref 70–99)
Glucose-Capillary: 128 mg/dL — ABNORMAL HIGH (ref 70–99)
Glucose-Capillary: 174 mg/dL — ABNORMAL HIGH (ref 70–99)
Glucose-Capillary: 86 mg/dL (ref 70–99)

## 2023-03-10 MED FILL — Sodium Chloride IV Soln 0.9%: INTRAVENOUS | Qty: 1000 | Status: AC

## 2023-03-10 MED FILL — Heparin Sodium (Porcine) Inj 1000 Unit/ML: INTRAMUSCULAR | Qty: 30 | Status: AC

## 2023-03-10 NOTE — Progress Notes (Signed)
Occupational Therapy Session Note  Patient Details  Name: Aqua Unthank MRN: [id number] Date of Birth: 09/18/1970  Today's Date: 03/10/2023 OT Individual Time: 0930-1040 OT Individual Time Calculation (min): 70 min    Short Term Goals: Week 1:  OT Short Term Goal 1 (Week 1): Pt will complete LB bathing with Min A + LRAD. OT Short Term Goal 2 (Week 1): Pt will complete LB dressing with Min A + LRAD. OT Short Term Goal 3 (Week 1): Pt will verbalized at least 2 alternative pain management strategies outside of medication.  Skilled Therapeutic Interventions/Progress Updates:    Pt resting in w/c upon arrival. Pt already dressed. OT intervention with focus on AE education, DME education, review of LTGs, grooming at sink, and BUE therex to increase independence with bADLs. Pt owns reacher and BSC. Pt educated in use of sock aid for donning socks. Pt return demonstrated. Grooming at sink with supervision. W/c propulsion with supervision. BUE therex with yellow theraband circuit 3x8 for BUE strengthening. Pt remained in w/c with all needs within reach. Belt alarm activated.   Therapy Documentation Precautions:  Precautions Precautions: Back Precaution Comments: Pt able to recall 3/3 precautions. Required Braces or Orthoses: Other Brace Other Brace: No brace ordered, LSO present in room and pt reports Dr. Wynetta Emery had her get one prior to surgery. Restrictions Weight Bearing Restrictions: No   Pain: Pt denies pain this morning   Therapy/Group: Individual Therapy  Rich Brave 03/10/2023, 12:27 PM

## 2023-03-10 NOTE — Discharge Summary (Signed)
Physician Discharge Summary  Patient ID: Jill Holt MRN: [id number] DOB/AGE: December 13, 1970 52 y.o.  Admit date: 03/07/2023 Discharge date: 03/20/2023  Discharge Diagnoses:  Principal Problem:   Lumbar radiculopathy Active Problems:   Coping with chronic pain Pain management Hypertension Diabetes mellitus Hyperlipidemia Constipation Iron deficiency anemia  Discharged Condition: Stable  Significant Diagnostic Studies: VAS [place] LOWER EXTREMITY VENOUS (DVT)  Result Date: 03/09/2023  Lower Venous DVT Study Patient Name:  Jill Holt  Date of Exam:   03/09/2023 Medical Rec #: [id number]       Accession #:    [id number] Date of Birth: 1970-12-24       Patient Gender: F Patient Age:   22 years Exam Location:  [facility] Procedure:      VAS [place] LOWER EXTREMITY VENOUS (DVT) Referring Phys: Mariam Dollar --------------------------------------------------------------------------------  Indications: Rehab patient - swelling.  Risk Factors: Decreased mobility/activity Surgery Lumbar decompressive laminectomy redo & transforaminal lumbar interfoby fusion (03/03/2023). Limitations: Suboptimal patient positioning for RLE portion of exam. Comparison Study: No previous exams Performing Technologist: Jody Hill RVT, RDMS  Examination Guidelines: A complete evaluation includes B-mode imaging, spectral Doppler, color Doppler, and power Doppler as needed of all accessible portions of each vessel. Bilateral testing is considered an integral part of a complete examination. Limited examinations for reoccurring indications may be performed as noted. The reflux portion of the exam is performed with the patient in reverse Trendelenburg.  +---------+---------------+---------+-----------+----------+-------------------+ RIGHT    CompressibilityPhasicitySpontaneityPropertiesThrombus Aging      +---------+---------------+---------+-----------+----------+-------------------+ CFV      Full           Yes       Yes                                      +---------+---------------+---------+-----------+----------+-------------------+ SFJ      Full                                                             +---------+---------------+---------+-----------+----------+-------------------+ FV Prox  Full           Yes      Yes                                      +---------+---------------+---------+-----------+----------+-------------------+ FV Mid   Full           Yes      Yes                                      +---------+---------------+---------+-----------+----------+-------------------+ FV DistalFull           Yes      Yes                                      +---------+---------------+---------+-----------+----------+-------------------+ PFV      Full                                                             +---------+---------------+---------+-----------+----------+-------------------+  POP      Full           Yes      Yes                                      +---------+---------------+---------+-----------+----------+-------------------+ PTV      Full                                                             +---------+---------------+---------+-----------+----------+-------------------+ PERO                                                  Not well visualized +---------+---------------+---------+-----------+----------+-------------------+   +---------+---------------+---------+-----------+----------+--------------+ LEFT     CompressibilityPhasicitySpontaneityPropertiesThrombus Aging +---------+---------------+---------+-----------+----------+--------------+ CFV      Full           Yes      Yes                                 +---------+---------------+---------+-----------+----------+--------------+ SFJ      Full                                                         +---------+---------------+---------+-----------+----------+--------------+ FV Prox  Full           Yes      Yes                                 +---------+---------------+---------+-----------+----------+--------------+ FV Mid   Full           Yes      Yes                                 +---------+---------------+---------+-----------+----------+--------------+ FV DistalFull           Yes      Yes                                 +---------+---------------+---------+-----------+----------+--------------+ PFV      Full                                                        +---------+---------------+---------+-----------+----------+--------------+ POP      Full           Yes      Yes                                 +---------+---------------+---------+-----------+----------+--------------+ PTV  Full                                                        +---------+---------------+---------+-----------+----------+--------------+ PERO     Full                                                        +---------+---------------+---------+-----------+----------+--------------+     Summary: BILATERAL: - No evidence of deep vein thrombosis seen in the lower extremities, bilaterally. -No evidence of popliteal cyst, bilaterally.   *See table(s) above for measurements and observations. Electronically signed by Carolynn Sayers on 03/09/2023 at 4:06:42 PM.    Final    DG Lumbar Spine 2-3 Views  Result Date: 03/03/2023 CLINICAL DATA:  Elective surgery. EXAM: LUMBAR SPINE - 2-3 VIEW COMPARISON:  None Available. FINDINGS: Two fluoroscopic spot views of the lumbar spine obtained in the operating room. Pedicle screws are present at L3, L4, and L5 with interbody spacers in place. Fluoroscopy time 1 minutes 1 seconds. Dose 40.90 mGy. IMPRESSION: Intraoperative fluoroscopy during lumbar fusion. Electronically Signed   By: Narda Rutherford M.D.   On: 03/03/2023 20:09   DG C-Arm 1-60  Min-No Report  Result Date: 03/03/2023 Fluoroscopy was utilized by the requesting physician.  No radiographic interpretation.   DG C-Arm 1-60 Min-No Report  Result Date: 03/03/2023 Fluoroscopy was utilized by the requesting physician.  No radiographic interpretation.   DG C-Arm 1-60 Min-No Report  Result Date: 03/03/2023 Fluoroscopy was utilized by the requesting physician.  No radiographic interpretation.    Labs:  Basic Metabolic Panel: Recent Labs  Lab 03/19/23 0613  NA 139  K 3.8  CL 100  CO2 28  GLUCOSE 120*  BUN 7  CREATININE 0.72  CALCIUM 9.3     CBC: Recent Labs  Lab 03/19/23 0613  WBC 4.8  NEUTROABS 2.5  HGB 9.3*  HCT 30.1*  MCV 74.3*  PLT 395     CBG: Recent Labs  Lab 03/18/23 2056 03/19/23 0602 03/19/23 1150 03/19/23 1649 03/19/23 2051  GLUCAP 119* 116* 205* 156* 128*    Brief HPI:   Jill Holt is a 52 y.o. right-handed female with history of hypertension, diabetes mellitus iron deficiency anemia as well as history of lumbar L3-4 and 4-5 laminotomies.  Per chart review patient lives with her children.  1 level home 3 steps to entry.  She had been using a rolling walker since March due to foot drop.  She essentially sponge base for her ADLs.  Presented 03/03/2023 with bilateral hip and leg pain.  Workup and imaging revealed degenerative disc disease spinal stenosis L3-4 and 4-5.  Underwent redo decompressive laminectomies L3-4 and L4-5 with radical foraminotomies L3-4-V nerve roots bilaterally by Dr. Wynetta Emery 03/03/2023 as well as transforaminal lumbar interbody fusions with screw fixation and posterior lateral arthrodesis.  SCDs for DVT prophylaxis.  No postoperative orders given for brace.  Therapy evaluations completed due to patient decreased functional mobility was admitted for a comprehensive rehab program.   Hospital Course: Jill Holt was admitted to rehab 03/07/2023 for inpatient therapies to consist of PT, ST and OT at least three hours five  days a week. Past admission  physiatrist, therapy team and rehab RN have worked together to provide customized collaborative inpatient rehab.  Pertaining to patient's severe foraminal stenosis-radiculopathy with history of L3-4 4-5 laminotomies.  Status post redo decompressive laminectomies with radical foraminotomies 03/03/2023 per Dr. Wynetta Emery.  No back brace required.  She did have bilateral foot drop needing AFOs for ambulation.  SCDs for DVT prophylaxis venous Doppler studies negative.  Pain management with the use of Neurontin as directed with Flexeril and hydrocodone as needed.  Blood pressure controlled on multiple antihypertensive medications of Norvasc Cozaar hydrochlorothiazide metoprolol.  She would need outpatient follow-up with her primary care provider.  Blood sugars overall monitored hemoglobin A1c 7.8 insulin therapy as directed as well as Glucotrol.  Bouts of constipation resolved with laxative assistance.  Lipitor ongoing for hyperlipidemia.  She had some mild hypoxia wean from oxygen oxygen saturations maintained.  Iron deficiency anemia with latest hemoglobin 8.5.   Blood pressures were monitored on TID basis and soft and monitored  Diabetes has been monitored with ac/hs CBG checks and SSI was use prn for tighter BS control.    Rehab course: During patient's stay in rehab weekly team conferences were held to monitor patient's progress, set goals and discuss barriers to discharge. At admission, patient required contact-guard assist 40 feet rolling walker minimal assist sit to stand  Physical exam.  Blood pressure 137/82 pulse 106 temperature 99 respirations 18 oxygen saturation 98% room air Constitutional.  No acute distress HEENT Head.  Normocephalic and atraumatic Eyes.  Pupils round and reactive to light no discharge without nystagmus Neck.  Supple nontender no JVD without thyromegaly Cardiac regular rate and rhythm without any extra sounds or murmur heard Abdomen.  Soft nontender  positive bowel sounds without rebound Respiratory effort normal no respiratory distress without wheeze Skin.  Back incision with honeycomb dressing.  Minimal drainage Neurologic.  Alert oriented x 3 normal insight and awareness.  Cranial nerve exam unremarkable.  Manual muscle testing upper extremities 5/5.  Right lower extremity 3/5 hip flexors, knee extension and trace/5 ADF, 2/5 APF left lower extremity 3-23/5 HF, KE and 0 to trace ADF, 1+ APF.  Decreased sensation light touch in both feet.  He/She  has had improvement in activity tolerance, balance, postural control as well as ability to compensate for deficits. He/She has had improvement in functional use RUE/LUE  and RLE/LLE as well as improvement in awareness.  Patient ambulatory to the bathroom for continent void contact-guard assist.  Ambulates to the sink for hand hygiene with minimal assist.  Supervision for standing.  Ambulating 132 feet with AFO braces minimal assist overall.  Navigate stairs minimal assistance.  Full family teaching completed plan discharge to home       Disposition: Discharge to home    Diet: Diabetic diet  Special Instructions: No driving smoking or alcohol  Medications at discharge. 1.  Tylenol as needed 2.  Norvasc 10 mg p.o. daily 3.  Lipitor 80 mg p.o. daily.  Patient refused due to nausea 4.  Flexeril 10 mg p.o. 3 times daily as needed muscle spasms 5.  Colace 100 mg p.o. twice daily 6.  Neurontin 300 mg p.o. 3 times daily 7.  Glucotrol XL 10 mg p.o. daily 8.  Hydrocodone 1 to 2 tablets every 4 hours as needed moderate pain 9.  NovoLog Mix 70/30 35 units in the morning 20 units in the evening 10.  Hyzaar 100-12.5 mg daily 11.  Amitiza 24 mcg p.o. twice daily 12.  Toprol-XL 25 mg p.o.  daily 13.  Prilosec 40 mg daily 14.  MiraLAX twice daily hold for loose stools  30-35 minutes were spent completing discharge summary and discharge planning  Discharge Instructions     Ambulatory referral to  Physical Medicine Rehab   Complete by: As directed    Moderate complexity follow-up 1 to 2 weeks lumbar radiculopathy with bilateral foot drop        Follow-up Information     Lovorn, Aundra Millet, MD Follow up.   Specialty: Physical Medicine and Rehabilitation Why: Only as directed Contact information: [address] [place] [address] [phone]         Donalee Citrin, MD Follow up.   Specialty: Neurosurgery Why: Call for appointment Contact information: [address] [place] [address] [phone]                 Signed: Charlton Amor 03/20/2023, 4:45 AM

## 2023-03-10 NOTE — Progress Notes (Signed)
Met with patient, sitting up in wheelchair with back brace on and bilateral PRAFO boots in place.  Some pedal edema noted with left greater than right.  Reports pain to left foot.  Introduced self and oriented to rehab.  Discussed team conference on Tuesdays and SW will follow up.  Discussed binder and check list for discharge.  Discussed A1C of 7.8.  Reports that her diet is fine and has been working on her A1C and that was 14 and now 7.4 and not 7.8. Takes insulin at home.  Reports that understands what needs to be done to continue to lower her A1c.  She is able to verbalize back precautions.  Had good BM yesterday per patient. Nursing reports that patient mood fluctuates from being low yesterday to high this AM.  All needs met, call bell in reach.

## 2023-03-10 NOTE — Progress Notes (Signed)
Inpatient Rehabilitation Care Coordinator Assessment and Plan Patient Details  Name: Jill Holt MRN: [id number] Date of Birth: 1970/10/03  Today's Date: 03/10/2023  Hospital Problems: Principal Problem:   Lumbar radiculopathy  Past Medical History:  Past Medical History:  Diagnosis Date   Acute urinary retention    Anemia    Iron Deficiency Anemia   Arthritis    Constipation, acute    s/p each surgery   COVID 09/2018   had pneumonia   Diabetes mellitus without complication (HCC)    GERD (gastroesophageal reflux disease)    Hypertension    Pneumonia    Past Surgical History:  Past Surgical History:  Procedure Laterality Date   CARPAL TUNNEL RELEASE Bilateral    FEMUR FRACTURE SURGERY Left    ganglion cyst removal Right    Hand   LUMBAR LAMINECTOMY/DECOMPRESSION MICRODISCECTOMY Left 07/24/2022   Procedure: Sublaminar decompression - left - Lumbar Three-Lumbar Four - Lumbar Four-Lumbar Five;  Surgeon: Donalee Citrin, MD;  Location: [facility] OR;  Service: Neurosurgery;  Laterality: Left;  3C   TUBAL LIGATION     1990s   Social History:  reports that she has never smoked. She has never been exposed to tobacco smoke. She has never used smokeless tobacco. She reports current alcohol use. She reports that she does not use drugs.  Family / Support Systems Marital Status: Divorced How Long?: 2 years Patient Roles: Parent Spouse/Significant Other: Divorced Children: Two children- Hospital doctor (dtr; lives iwth her) and Linwood Dibbles (son; incarcerated has 10 month sentence) Other Supports: none reported Anticipated Caregiver: Dtr Chelsea Ability/Limitations of Caregiver: Pt dtr is a few weeks post partum and not able to provide any physical uspport Caregiver Availability: 24/7 Family Dynamics: Pt lives with her dtr adn her family  Social History Preferred language: English Religion: Christian Cultural Background: Pt has served as a Materials engineer since 1999. Education: high school  grad Health Literacy - How often do you need to have someone help you when you read instructions, pamphlets, or other written material from your doctor or pharmacy?: Never Writes: Yes Employment Status: Employed Name of Employer: self-employed as a caretaker for 3 years. Return to Work Plans: TBD Marine scientist Issues: Pt reports an incident over 30 years ago and denies any recent incidents. Guardian/Conservator: N/A   Abuse/Neglect Abuse/Neglect Assessment Can Be Completed: Yes Physical Abuse: Denies Verbal Abuse: Denies Sexual Abuse: Denies Exploitation of patient/patient's resources: Denies Self-Neglect: Denies  Patient response to: Social Isolation - How often do you feel lonely or isolated from those around you?: Never  Emotional Status Pt's affect, behavior and adjustment status: Pt in good spirits at time of visit. Pt was tired and stumbling over her words. Recent Psychosocial Issues: Denies Psychiatric History: Denies Substance Abuse History: Pt admits to occassional etoh use. Denies any rec or tobacco product use.  Patient / Family Perceptions, Expectations & Goals Pt/Family understanding of illness & functional limitations: Pt and family have a general understanding of pt care needs Premorbid pt/family roles/activities: Independent Anticipated changes in roles/activities/participation: Assistance with ADLs/IADLs Pt/family expectations/goals: pt goal is to work on keeping swelling down, and strength.  Community Resources [employer]: None Premorbid Home Care/DME Agencies: None Transportation available at discharge: TBD Is the patient able to respond to transportation needs?: Yes In the past 12 months, has lack of transportation kept you from medical appointments or from getting medications?: No In the past 12 months, has lack of transportation kept you from meetings, work, or from getting things needed for  daily living?: No Resource referrals  recommended: Neuropsychology  Discharge Planning Living Arrangements: Children Support Systems: Children Type of Residence: Private residence Insurance Resources: Media planner (specify) ([place] Healthy [employer]) Financial Resources: Employment Financial Screen Referred: No Living Expenses: Lives with family Money Management: Family Does the patient have any problems obtaining your medications?: No Home Management: Pt dtr managed preparing meals. Both helped manage bills and house cleaning. Patient/Family Preliminary Plans: TBD Care Coordinator Barriers to Discharge: Decreased caregiver support, Lack of/limited family support, Insurance for SNF coverage Care Coordinator Anticipated Follow Up Needs: HH/OP Expected length of stay: 12-14 days  Clinical Impression SW met with pt in room to introduce self, explain role, discuss discharge process, and inform on ELOS. She is not a Cytogeneticist. No HCPOA. DME: rollator, RW, and 3in1 BSC. Pt aware SW spoke with pt dtr Chelsea, and family edu on Wed 1pm-3pm. SW will submit PCS referral.   1240-SW spoke with pt dtr Chelsea to introduce self, explain role, discuss discharge process, and inform on ELOS.She confirms pt will return to her home. She also is not able to provide any physical assistance at this time. Fam edu on Wednesday 1pm-3pm. She is aware there will be follow-up after team conference.   SW faxed PCS referral to insurance [phone].  Hermina Barnard A Ramzi Brathwaite 03/10/2023, 2:59 PM

## 2023-03-10 NOTE — Progress Notes (Signed)
[address] notified that pt had a hypoglycemic event during the night, BS corrected with food and drink intake. New order to place FSBS [facility]

## 2023-03-10 NOTE — Progress Notes (Signed)
Inpatient Rehabilitation  Patient information reviewed and entered into eRehab system by Oyuki Hogan M. Eri Mcevers, M.A., CCC/SLP, PPS Coordinator.  Information including medical coding, functional ability and quality indicators will be reviewed and updated through discharge.    

## 2023-03-10 NOTE — Progress Notes (Signed)
Physical Therapy Session Note  Patient Details  Name: Carryl Lassiter MRN: [id number] Date of Birth: 1971/04/23  Today's Date: 03/10/2023 PT Individual Time: 1415-1530 PT Individual Time Calculation (min): 75 min   Short Term Goals: Week 1:  PT Short Term Goal 1 (Week 1): pt will perform bed mobiltiy with min a PT Short Term Goal 2 (Week 1): Pt will ambulate 100 ft with assist PT Short Term Goal 3 (Week 1): Pt will navigate 3" stairs with assist  Skilled Therapeutic Interventions/Progress Updates:    pt received in bed and agreeable to therapy. Pt reports pain largely controlled this session. Donned shoes and AFOs tot A. gait training with rollator to/from day room with one rest break d/t fatigue. Cues for decreased UE reliance. NMR for anterior lean for improved sit<>stand mechanics. Progressed from anterior lean from hips onto physioball to standing with hands on therapist's shoulders. Pt returned to recliner after session, was left with all needs in reach and alarm active.   Therapy Documentation Precautions:  Precautions Precautions: Back Precaution Comments: Pt able to recall 3/3 precautions. Required Braces or Orthoses: Other Brace Other Brace: No brace ordered, LSO present in room and pt reports Dr. Wynetta Emery had her get one prior to surgery. Restrictions Weight Bearing Restrictions: No General:       Therapy/Group: Individual Therapy  [name] 03/10/2023, 3:38 PM

## 2023-03-10 NOTE — IPOC Note (Signed)
Overall Plan of Care [facility]) Patient Details Name: Jill Holt MRN: [id number] DOB: [birth year]  Admitting Diagnosis: Lumbar radiculopathy  Hospital Problems: Principal Problem:   Lumbar radiculopathy     Functional Problem List: Nursing Bowel, Endurance, Medication Management, Motor, Pain, Safety, Sensory  PT Balance, Pain, Edema, Safety, Endurance, Sensory, Motor  OT Balance, Endurance, Motor, Pain, Perception, Safety, Sensory  SLP    TR         Basic ADL's: OT Bathing, Dressing, Toileting     Advanced  ADL's: OT Simple Meal Preparation     Transfers: PT Bed Mobility, Bed to Chair, Customer service manager, Tub/Shower     Locomotion: PT Ambulation, Wheelchair Mobility     Additional Impairments: OT    SLP        TR      Anticipated Outcomes Item Anticipated Outcome  Self Feeding    Swallowing      Basic self-care  Mod I  Toileting  Mod I   Bathroom Transfers Mod I  Bowel/Bladder  continent of urine and idependent with bowel program  Transfers  mod I with LRAD  Locomotion  mod I in the home, supervision community  Communication     Cognition     Pain  less than 4  Safety/Judgment  with cueing   Therapy Plan: PT Intensity: Minimum of 1-2 x/day ,45 to 90 minutes PT Frequency: 5 out of 7 days PT Duration Estimated Length of Stay: 12-14 days OT Intensity: Minimum of 1-2 x/day, 45 to 90 minutes OT Frequency: 5 out of 7 days OT Duration/Estimated Length of Stay: 12-14 days     Team Interventions: Nursing Interventions Patient/Family Education, Bowel Management, Disease Management/Prevention, Pain Management, Medication Management, Discharge Planning, Psychosocial Support  PT interventions Ambulation/gait training, Discharge planning, DME/adaptive equipment instruction, Functional mobility training, Pain management, Psychosocial support, Splinting/orthotics, Therapeutic Activities, UE/LE Strength taining/ROM, Wheelchair propulsion/positioning, UE/LE  Coordination activities, Therapeutic Exercise, Stair training, Patient/family education, Neuromuscular re-education, Functional electrical stimulation, Disease management/prevention, Warden/ranger, Community reintegration  OT Interventions Warden/ranger, Firefighter, Discharge planning, Fish farm manager, Disease mangement/prevention, Functional mobility training, Functional electrical stimulation, Neuromuscular re-education, Pain management, Patient/family education, Psychosocial support, Skin care/wound managment, Self Care/advanced ADL retraining, Splinting/orthotics, Therapeutic Activities, Therapeutic Exercise, UE/LE Strength taining/ROM, Wheelchair propulsion/positioning, Visual/perceptual remediation/compensation, UE/LE Coordination activities  SLP Interventions    TR Interventions    SW/CM Interventions Discharge Planning, Psychosocial Support, Patient/Family Education   Barriers to Discharge MD  Medical stability, Home enviroment access/loayout, New diabetic, Wound care, Lack of/limited family support, Weight, and Weight bearing restrictions  Nursing Decreased caregiver support, Home environment access/layout, Neurogenic Bowel & Bladder, Lack of/limited family support, Insurance for SNF coverage, Medication compliance 1 level with 3 ste, rail on left  PT Decreased caregiver support, Home environment access/layout, Neurogenic Bowel & Bladder    OT Decreased caregiver support, Wound Care, Lack of/limited family support, Weight    SLP      SW Decreased caregiver support, Lack of/limited family support, Community education officer for SNF coverage     Team Discharge Planning: Destination: PT-Home ,OT- Home , SLP-  Projected Follow-up: PT-Outpatient PT, OT-  Home health OT, SLP-  Projected Equipment Needs: PT-To be determined, OT- To be determined, SLP-  Equipment Details: PT- , OT-  Patient/family involved in discharge planning: PT- Patient,   OT-Patient, SLP-   MD ELOS: 12-14 days Medical Rehab Prognosis:  Good Assessment: The patient has been admitted for CIR therapies with the diagnosis of lumbar radiculopathy  s/p lami and  foraminotomies with BB/L foot drop. The team will be addressing functional mobility, strength, stamina, balance, safety, adaptive techniques and equipment, self-care, bowel and bladder mgt, patient and caregiver education, DM care. Goals have been set at mod I. Anticipated discharge destination is home with family.        See Team Conference Notes for weekly updates to the plan of care

## 2023-03-10 NOTE — Discharge Instructions (Addendum)
Inpatient Rehab Discharge Instructions  Jill Holt Discharge date and time: No discharge date for patient encounter.   Activities/Precautions/ Functional Status: Activity: activity as tolerated Diet: diabetic diet Wound Care: Routine skin checks Functional status:  ___ No restrictions     ___ Walk up steps independently ___ 24/7 supervision/assistance   ___ Walk up steps with assistance ___ Intermittent supervision/assistance  ___ Bathe/dress independently ___ Walk with walker     _x__ Bathe/dress with assistance ___ Walk Independently    ___ Shower independently ___ Walk with assistance    ___ Shower with assistance ___ No alcohol     ___ Return to work/school ________  Special Instructions: No driving smoking or alcohol   COMMUNITY REFERRALS UPON DISCHARGE:    Home Health:   PT      OT                 Agency: [facility]  Phone: [phone]  *Please expect follow-up within 2-3 days to schedule your home visit. If you have not received follow-up, be sure to contact the branch directly.*   Medical Equipment/Items Ordered: tub transfer bench and rollator                                                 Agency/Supplier: [facility] [phone]  GENERAL COMMUNITY RESOURCES FOR PATIENT/FAMILY: A referral was submitted on your behalf to your insurance with the Long Term Service Department [phone] for personal care services. Assessment with nursing is scheduled for 04/01/23.  If you would like to check status, be sure to call to get updates.     My questions have been answered and I understand these instructions. I will adhere to these goals and the provided educational materials after my discharge from the hospital.  Patient/Caregiver Signature _______________________________ Date __________  Clinician Signature _______________________________________ Date __________  Please bring this form and your medication list with you to all your  follow-up doctor's appointments.

## 2023-03-10 NOTE — Care Management (Signed)
Inpatient Rehabilitation Center Individual Statement of Services  Patient Name:  Jill Holt  Date:  03/10/2023  Welcome to the Inpatient Rehabilitation Center.  Our goal is to provide you with an individualized program based on your diagnosis and situation, designed to meet your specific needs.  With this comprehensive rehabilitation program, you will be expected to participate in at least 3 hours of rehabilitation therapies Monday-Friday, with modified therapy programming on the weekends.  Your rehabilitation program will include the following services:  Physical Therapy (PT), Occupational Therapy (OT), 24 hour per day rehabilitation nursing, Therapeutic Recreaction (TR), Psychology, Neuropsychology, Care Coordinator, Rehabilitation Medicine, Nutrition Services, Pharmacy Services, and Other  Weekly team conferences will be held on Tuesdays to discuss your progress.  Your Inpatient Rehabilitation Care Coordinator will talk with you frequently to get your input and to update you on team discussions.  Team conferences with you and your family in attendance may also be held.  Expected length of stay: 12-14 days  Overall anticipated outcome: Independent   Depending on your progress and recovery, your program may change. Your Inpatient Rehabilitation Care Coordinator will coordinate services and will keep you informed of any changes. Your Inpatient Rehabilitation Care Coordinator's name and contact numbers are listed  below.  The following services may also be recommended but are not provided by the Inpatient Rehabilitation Center:  Driving Evaluations Home Health Rehabiltiation Services Outpatient Rehabilitation Services Vocational Rehabilitation   Arrangements will be made to provide these services after discharge if needed.  Arrangements include referral to agencies that provide these services.  Your insurance has been verified to be:  [place] Medicaid Health Blue  Your primary doctor is:   Gardiner Fanti  Pertinent information will be shared with your doctor and your insurance company.  Inpatient Rehabilitation Care Coordinator:  Susie Cassette [phone] or ([phone]  Information discussed with and copy given to patient by: Gretchen Short, 03/10/2023, 12:38 PM

## 2023-03-10 NOTE — Progress Notes (Signed)
PROGRESS NOTE   Subjective/Complaints:  Pt reports dexcom showed CBG was 44 last night- our BG check was 34- pt very scared-  Pain also 6/10 tis AM- hasn't gotten meds yet.  Got back brace on to help pain.   BG 86 this AM based on our reading- 96 on her dexcom- LBM yesterday finally went.   Found out from nursing they gave insulin last night, but family was supposed to bring in food- and didn't.   Was upset because "couldn't eat hospital food".    ROS-  Pt denies SOB, abd pain, CP, N/V/C/D, and vision changes   Except for HPI  Objective:   VAS [place] LOWER EXTREMITY VENOUS (DVT)  Result Date: 03/09/2023  Lower Venous DVT Study Patient Name:  Jill Holt  Date of Exam:   03/09/2023 Medical Rec #: [id number]       Accession #:    [id number] Date of Birth: Dec 14, 1970       Patient Gender: F Patient Age:   52 years Exam Location:  [facility] Procedure:      VAS [place] LOWER EXTREMITY VENOUS (DVT) Referring Phys: Mariam Dollar --------------------------------------------------------------------------------  Indications: Rehab patient - swelling.  Risk Factors: Decreased mobility/activity Surgery Lumbar decompressive laminectomy redo & transforaminal lumbar interfoby fusion (03/03/2023). Limitations: Suboptimal patient positioning for RLE portion of exam. Comparison Study: No previous exams Performing Technologist: Jody Hill RVT, RDMS  Examination Guidelines: A complete evaluation includes B-mode imaging, spectral Doppler, color Doppler, and power Doppler as needed of all accessible portions of each vessel. Bilateral testing is considered an integral part of a complete examination. Limited examinations for reoccurring indications may be performed as noted. The reflux portion of the exam is performed with the patient in reverse Trendelenburg.  +---------+---------------+---------+-----------+----------+-------------------+ RIGHT     CompressibilityPhasicitySpontaneityPropertiesThrombus Aging      +---------+---------------+---------+-----------+----------+-------------------+ CFV      Full           Yes      Yes                                      +---------+---------------+---------+-----------+----------+-------------------+ SFJ      Full                                                             +---------+---------------+---------+-----------+----------+-------------------+ FV Prox  Full           Yes      Yes                                      +---------+---------------+---------+-----------+----------+-------------------+ FV Mid   Full           Yes      Yes                                      +---------+---------------+---------+-----------+----------+-------------------+  FV DistalFull           Yes      Yes                                      +---------+---------------+---------+-----------+----------+-------------------+ PFV      Full                                                             +---------+---------------+---------+-----------+----------+-------------------+ POP      Full           Yes      Yes                                      +---------+---------------+---------+-----------+----------+-------------------+ PTV      Full                                                             +---------+---------------+---------+-----------+----------+-------------------+ PERO                                                  Not well visualized +---------+---------------+---------+-----------+----------+-------------------+   +---------+---------------+---------+-----------+----------+--------------+ LEFT     CompressibilityPhasicitySpontaneityPropertiesThrombus Aging +---------+---------------+---------+-----------+----------+--------------+ CFV      Full           Yes      Yes                                  +---------+---------------+---------+-----------+----------+--------------+ SFJ      Full                                                        +---------+---------------+---------+-----------+----------+--------------+ FV Prox  Full           Yes      Yes                                 +---------+---------------+---------+-----------+----------+--------------+ FV Mid   Full           Yes      Yes                                 +---------+---------------+---------+-----------+----------+--------------+ FV DistalFull           Yes      Yes                                 +---------+---------------+---------+-----------+----------+--------------+  PFV      Full                                                        +---------+---------------+---------+-----------+----------+--------------+ POP      Full           Yes      Yes                                 +---------+---------------+---------+-----------+----------+--------------+ PTV      Full                                                        +---------+---------------+---------+-----------+----------+--------------+ PERO     Full                                                        +---------+---------------+---------+-----------+----------+--------------+     Summary: BILATERAL: - No evidence of deep vein thrombosis seen in the lower extremities, bilaterally. -No evidence of popliteal cyst, bilaterally.   *See table(s) above for measurements and observations. Electronically signed by Carolynn Sayers on 03/09/2023 at 4:06:42 PM.    Final    Recent Labs    03/08/23 0521 03/10/23 0508  WBC 6.7 7.5  HGB 8.5* 8.9*  HCT 27.2* 27.6*  PLT 218 252   Recent Labs    03/08/23 0521 03/10/23 0508  NA 138 135  K 3.8 4.2  CL 98 97*  CO2 29 25  GLUCOSE 104* 124*  BUN 13 13  CREATININE 0.88 0.93  CALCIUM 8.9 8.6*    Intake/Output Summary (Last 24 hours) at 03/10/2023 1515 Last data filed at  03/10/2023 1320 Gross per 24 hour  Intake 476 ml  Output --  Net 476 ml        Physical Exam: Vital Signs Blood pressure 118/82, pulse (!) 104, temperature 98.5 F (36.9 C), temperature source Oral, resp. rate 18, height 5\' 4"  (1.626 m), weight 90.6 kg, last menstrual period 05/31/2022, SpO2 100%.    General: awake, alert, appropriate, sitting up in bedside chair; wearing back corset brace; NAD HENT: conjugate gaze; oropharynx moist CV: regular rate- borderline tachycardic regular rhythm; no JVD Pulmonary: CTA B/L; no W/R/R- good air movement GI: soft, NT, ND, (+)BS- more normoactive Psychiatric: appropriate- concerned about BG Neurological: Ox3  Skin: No evidence of breakdown, no evidence of rash- however has honeycomb on back- looks good- no drainage around dressing Neurologic: Cranial nerves II through XII intact, motor strength is 5/5 in bilateral deltoid, bicep, tricep, grip, Pain limits MMT to 2- hip flexor, knee extensors, ankle dorsiflexor and plantar flexor Sensory exam hypersensitive to LT L5-S1 , intact LT L2-S1  Musculoskeletal: Full range of motion in all 4 extremities. No joint swelling   Assessment/Plan: 1. Functional deficits which require 3+ hours per day of interdisciplinary therapy in a comprehensive inpatient rehab setting. Physiatrist is providing close team supervision and 24 hour management of active  medical problems listed below. Physiatrist and rehab team continue to assess barriers to discharge/monitor patient progress toward functional and medical goals  Care Tool:  Bathing    Body parts bathed by patient: Right arm, Left arm, Chest, Abdomen, Front perineal area, Face, Right upper leg, Left upper leg   Body parts bathed by helper: Right lower leg, Left lower leg, Buttocks     Bathing assist Assist Level: Moderate Assistance - Patient 50 - 74%     Upper Body Dressing/Undressing Upper body dressing   What is the patient wearing?: Hospital gown  only    Upper body assist Assist Level: Set up assist    Lower Body Dressing/Undressing Lower body dressing      What is the patient wearing?: Hospital gown only     Lower body assist Assist for lower body dressing: Set up assist     Toileting Toileting    Toileting assist Assist for toileting: Contact Guard/Touching assist     Transfers Chair/bed transfer  Transfers assist     Chair/bed transfer assist level: Minimal Assistance - Patient > 75%     Locomotion Ambulation   Ambulation assist      Assist level: Minimal Assistance - Patient > 75% Assistive device: Walker-rolling Max distance: 132   Walk 10 feet activity   Assist     Assist level: Contact Guard/Touching assist Assistive device: Walker-rolling   Walk 50 feet activity   Assist Walk 50 feet with 2 turns activity did not occur: Safety/medical concerns         Walk 150 feet activity   Assist Walk 150 feet activity did not occur: Safety/medical concerns         Walk 10 feet on uneven surface  activity   Assist Walk 10 feet on uneven surfaces activity did not occur: Safety/medical concerns         Wheelchair     Assist Is the patient using a wheelchair?: Yes Type of Wheelchair: Manual    Wheelchair assist level: Dependent - Patient 0% Max wheelchair distance: 150    Wheelchair 50 feet with 2 turns activity    Assist        Assist Level: Dependent - Patient 0%   Wheelchair 150 feet activity     Assist      Assist Level: Dependent - Patient 0%   Blood pressure 118/82, pulse (!) 104, temperature 98.5 F (36.9 C), temperature source Oral, resp. rate 18, height 5\' 4"  (1.626 m), weight 90.6 kg, last menstrual period 05/31/2022, SpO2 100%.  Medical Problem List and Plan: 1. 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  Status post redo decompressive laminectomies L3-4 and L4-5 with  complete facetectomies and radical foraminotomies , L3-03/03/2023 LE weakness is in part due to pain, will optimize regimen              -patient may shower             -ELOS/Goals: 8-11 days with mod I goals PT, OT             -PRAFO's for both LE's at night             -will need AFO's for gait  Con't CIR PT and OT-  Team conference tomorrow to determine length of stay 2.  Antithrombotics: -DVT/anticoagulation:  Mechanical:  Antiembolism stockings, knee (TED hose) Bilateral lower extremities.  Check vascular study.  Discussed with neurosurgery on beginning Lovenox 11/11-  Dopplers (-)               -antiplatelet therapy: N/A 3. Pain Management: currently on 600 mg twice daily, Flexeril and hydrocodone as needed             -pt was on gabapentin 900mg  tid at home--increase to 600mg  tid to start  11/11- pain 6/10 without meds this AM- gets better with meds 4. Mood/Behavior/Sleep: Provide emotional support- labile this am , "feels overwhelmed by medical issues "             -antipsychotic agents: N/A 5. Neuropsych/cognition: This patient is capable of making decisions on her own behalf. 6. Skin/Wound Care: Routine skin checks 7. Fluids/Electrolytes/Nutrition: Routine in and outs with follow-up chemistries 8.  Hypertension.  Norvasc 10 mg daily, Cozaar 100 mg daily, hydrochlorothiazide 12.5 mg daily, Toprol-XL 25 mg daily.  Monitor with increased mobility  11/11- BP controlled after meds taken for AM- will monitor- is labile before/after meds Vitals:   03/10/23 0729 03/10/23 1254  BP: (!) 156/100 118/82  Pulse: (!) 106 (!) 104  Resp: 17 18  Temp:  98.5 F (36.9 C)  SpO2: 91% 100%    9.  Diabetes mellitus.  Hemoglobin A1c 7.8.  NovoLog Mix 70/30 35 units with breakfast, 25 units with supper, Glucotrol XL 10 mg daily. CBG (last 3)  Recent Labs    03/10/23 0039 03/10/23 0615 03/10/23 1135  GLUCAP 109* 86 128*  Controlled 11/9  11/11- CBGs dropped to 44/34 last night due to not  eating dinner and family not bringing in food like planned- will order to give 70/30 only if BG >120- and has food- changed both orders 10.  Hyperlipidemia.  Lipitor 11.  Constipation.  Colace twice daily, MiraLAX BID, have added Amitiza for OIC, give dulc supp this pm if no BM   11/11- had BM yesterday feeling much better 12. Mild hypoxia:             -wean oxygen as able             -check CBC in am 13. C/o dizziness while up with therapy. Sinus tachy on exam             -CBC as above, check BMET as well             -encourage fluids     14.  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 Also did IPOC   LOS: 3 days A FACE TO FACE EVALUATION WAS PERFORMED  Jill Holt 03/10/2023, 3:15 PM

## 2023-03-11 DIAGNOSIS — M5416 Radiculopathy, lumbar region: Secondary | ICD-10-CM | POA: Diagnosis not present

## 2023-03-11 LAB — GLUCOSE, CAPILLARY
Glucose-Capillary: 139 mg/dL — ABNORMAL HIGH (ref 70–99)
Glucose-Capillary: 201 mg/dL — ABNORMAL HIGH (ref 70–99)
Glucose-Capillary: 89 mg/dL (ref 70–99)

## 2023-03-11 NOTE — Progress Notes (Signed)
Physical Therapy Session Note  Patient Details  Name: Jill Holt MRN: [id number] Date of Birth: 10/04/1970  Today's Date: 03/11/2023 PT Individual Time: 0845-1000, 1300-1345 PT Individual Time Calculation (min): 75 min, 45 min   Short Term Goals: Week 1:  PT Short Term Goal 1 (Week 1): pt will perform bed mobiltiy with min a PT Short Term Goal 2 (Week 1): Pt will ambulate 100 ft with assist PT Short Term Goal 3 (Week 1): Pt will navigate 3" stairs with assist  Skilled Therapeutic Interventions/Progress Updates:   Session 1: Pt seated in w/c on arrival and agreeable to therapy. No complaint of pain.   Pt requesting to bathe and preferring not to get dressed without washing up. Pt assisted with covering incision dressing and showering. Supervision with long handled sponge. Cueing for safety with transfer and back precautions throughout. Pt assisted with LB dressing for time, UB dressing with supervision including bra.   ambulatory transfer transfers with rolator without AFOs, min A d/t foot drop, not foot drag at times. Discussed safety at home and wearing braces as much as possible and minimizing gait when not wearing them (at night, etc.) Pt remained in w/c seated at sink to complete hygiene tasks, was left with all needs in reach and alarm active.   Session 2: Pt seated in w/c on arrival and agreeable to therapy. No pain at rest, but intense pain in L foot when trying new shoes d/t edema and shoes too tight. Donned shoes/AFOs tot A with difficulty. Pt was limited with ambulation d/t pain, transport via rollator at end of session. Stair training x 4 with BHR, min A. Pt still has difficulty clearing BLE onto step without circumduction, but improved from previous trials. Pt returned to room and set up in recliner to elevate BLE, therapist donned ace wrap to improve edema after discussion with nsg. Pt was left with all needs in reach and alarm active.   Therapy Documentation Precautions:   Precautions Precautions: Back Precaution Comments: Pt able to recall 3/3 precautions. Required Braces or Orthoses: Other Brace Other Brace: No brace ordered, LSO present in room and pt reports Dr. Wynetta Emery had her get one prior to surgery. Restrictions Weight Bearing Restrictions: No General:       Therapy/Group: Individual Therapy  [name] 03/11/2023, 12:51 PM

## 2023-03-11 NOTE — Progress Notes (Signed)
Patient ID: Jill Holt, female   DOB: Mar 06, 1971, 52 y.o.   MRN: [id number]  SW received confirmation fax from insurance stating [facility] referral received.   SW met with pt in room to provide updates from team conference, and d/c date 11/21. Pt reports she will need TTB. SW informed waiting on final recs from therapy team. SW informed on [facility] referral being submitted. She is aware SW will order rollator. She states she would like new pain medication as Gabapentin causes swelling. SW informed medical team on medication concerns, and waiting on updates about DME.   1537- SW spoke with pt dtr [place] to provide above updates. Confirms she will be here for family edu tomorrow. SW shared will provide updates on final d/c recs whether [facility] or outpatient. SW explained [facility] referral submitted ot insurance and at their discretion if they choose to accept.   *OT recommends TTB needed.   SW ordered rollator and TTB with [employer] via parachute.   Cecile Sheerer, MSW, LCSW Office: [phone] Cell: [phone] Fax: [phone]

## 2023-03-11 NOTE — Progress Notes (Signed)
Orthopedic Tech Progress Note Patient Details:  Jill Holt 11-Mar-1971 [id number] Called in AFO consults to [facility] Patient ID: Terrall Laity, female   DOB: 1971/01/30, 52 y.o.   MRN: [id number]  Lovett Calender 03/11/2023, 12:24 PM

## 2023-03-11 NOTE — Progress Notes (Signed)
Physical Therapy Session Note  Patient Details  Name: Jill Holt MRN: [id number] Date of Birth: May 18, 1970  Today's Date: 03/10/2023 PT Individual Time:  1103-1207  PT Individual Time Calculation (min):   Short Term Goals: Week 1:  PT Short Term Goal 1 (Week 1): pt will perform bed mobiltiy with min a PT Short Term Goal 2 (Week 1): Pt will ambulate 100 ft with assist PT Short Term Goal 3 (Week 1): Pt will navigate 3" stairs with assist   Skilled Therapeutic Interventions/Progress Updates:  Patient seated upright in w/c with Bil PRAFOs donned on entrance to room. Patient alert and agreeable to PT session.   Patient with no pain complaint at start of session.  Therapeutic Activity: PRAFOs doffed with TotA for time. Relates that shoes are whole size larger but are too tight and uncomfortable when stepping. In inspection of shoe, noted a narrow toe box with unforgiving material and stitching across ball of foot. Related to pt that upper can bee loosened for foot swelling, however toe box will continue to be tight and pt will need to find either Wide size in selected brand or try other brands with known wider toe box. Related knowledge of brands with wider toe box. DEP widening of laces in Bil shoes for slight improvement in fit at rest. Pt dons LSO with setup.   Transfers: Pt performed sit<>stand and stand pivot transfers throughout session with light MinA and multiple attempts. NMR provided for sit<>stand technique.  Gait Training:  Tightened brake line on pt's personal rollator and informed pt that she can get this fixed at a bike shop as the wire for the brake line has stretched and will require shortening. Pt ambulated >200 ft x2 using rollator with CGA. Demonstrated improved gait progression over use of RW. Significant improvement in gait speed. Continued heavy BUE downward push of BUE for balance.  Neuromuscular Re-ed: NMR facilitated during session with focus on standing balance,  motor control. Pt guided in improving technique for rise to stand and descent to sit with no AD. Guided in repositioning of Bil feet under pt with MinA. Guided in performance of hip hinge for sensation of forward weight shift. Pt requires hand positioning on therapist's shoulders for balance in weight shift.  NMR performed for improvements in motor control and coordination, balance, sequencing, judgement, and self confidence/ efficacy in performing all aspects of mobility at highest level of independence.   Patient seated upright in w/c at end of session with brakes locked, belt alarm set, and all needs within reach. Shoes doffed and PRAFOs donned.    Pt received rollator from mother and may be eligible for new rollator through insurance. Primary therapist notified.    Therapy Documentation Precautions:  Precautions Precautions: Back Precaution Comments: Pt able to recall 3/3 precautions. Required Braces or Orthoses: Other Brace Other Brace: No brace ordered, LSO present in room and pt reports Dr. Wynetta Emery had her get one prior to surgery. Restrictions Weight Bearing Restrictions: No  Pain:  Pt relates discomfort with Bil shoes and will require larger shoe size with wider toe box in order to accommodate addition of AFO as well as swelling in Bil feet.   Therapy/Group: Individual Therapy  Loel Dubonnet PT, DPT, CSRS 03/10/2023, 4:54 PM

## 2023-03-11 NOTE — Progress Notes (Signed)
Occupational Therapy Session Note  Patient Details  Name: Jill Holt MRN: [id number] Date of Birth: [birth year]  Today's Date: 03/11/2023 OT Individual Time: 1100-1155 1st Session; 1400-1425 2nd Session  OT Individual Time Calculation (min): 55 min, 25 min    Short Term Goals: Week 1:  OT Short Term Goal 1 (Week 1): Pt will complete LB bathing with Min A + LRAD. OT Short Term Goal 2 (Week 1): Pt will complete LB dressing with Min A + LRAD. OT Short Term Goal 3 (Week 1): Pt will verbalized at least 2 alternative pain management strategies outside of medication.  Skilled Therapeutic Interventions/Progress Updates:    Session 1:  Pt seen for skilled OT session this am with low pain reported, LSO corset in place and LE's elevated for pain control. Pt reported having already completed shower and all self care this am with another clinician and had no toileting nor ADL needs in room. Pt open to all activity presented. Pt transported to and from ADL apt for trial TTB into tub bench. Pt reports she has glass doors on tub shower and OT instructed in strategies to remove, hang tension rod and shower curtain and pt agreeable. OT also instructed on TTB set up, use of suction grab bars and where to obtain. Pt performed TTB using rollator for SPT with mod A due to decreased LE management in and out of tub over lip. Pt with significant hypersensitivity L>R when touched or assisted thus OT to great care in handling and increased time and effort required for task as pt is slow moving with all mobility.  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 Pt was able to log on and off site independently. Once back in room, OT instructed in use reacher to manage items in room to set herself up for lunch. OT replaced chair alarm belt, needs and  nurse call button in reach.    Pain: 2/10 pain reported in LB with repositioning  and light activity completed    Session 2:  Pt hand off from NT moving into recliner. OT positioned for proper LE elevation and body alignment. OT educated in pressure relief and need for position changes for spinal precautions and pain management with pt teach back. OT initiated light UE HEP in writing. Pt issued medium resistance foam grasp squeeze block. Pt with edema B LE's and unable to complete full ankle pump on R and unable on L thus grasp for cdp mngt and generalized strength. Pt demonstrated yellow tband triceps press 10 reps x 3 sets Bly. Left up in recliner with all safety measures, chair alarm set, and nurse call button in reach.    Pain: 2/10 back with LE's elevated, pillow and repositioning  Therapy Documentation Precautions:  Precautions Precautions: Back Precaution Comments: Pt able to recall 3/3 precautions. Required Braces or Orthoses: Other Brace Other Brace: No brace ordered, LSO present in room and pt reports Dr. Wynetta Emery had her get one prior to surgery. Restrictions Weight Bearing Restrictions: No   Therapy/Group: Individual Therapy  Vicenta Dunning 03/11/2023, 7:44 AM

## 2023-03-11 NOTE — Plan of Care (Signed)
  Problem: Consults Goal: RH SPINAL CORD INJURY PATIENT EDUCATION Description:  See Patient Education module for education specifics.  Outcome: Progressing   Problem: SCI BOWEL ELIMINATION Goal: RH STG MANAGE BOWEL WITH ASSISTANCE Description: STG Manage Bowel with min Assistance. Outcome: Progressing Goal: RH STG SCI MANAGE BOWEL WITH MEDICATION WITH ASSISTANCE Description: STG SCI Manage bowel with medication with min assistance. Outcome: Progressing Goal: RH STG SCI MANAGE BOWEL PROGRAM W/ASSIST OR AS APPROPRIATE Description: STG SCI Manage bowel program w/min assist or as appropriate. Outcome: Progressing   Problem: SCI BLADDER ELIMINATION Goal: RH STG MANAGE BLADDER WITH ASSISTANCE Description: STG Manage Bladder With min Assistance Outcome: Progressing Goal: RH STG MANAGE BLADDER WITH MEDICATION WITH ASSISTANCE Description: STG Manage Bladder With Medication With min Assistance. Outcome: Progressing   Problem: RH SKIN INTEGRITY Goal: RH STG SKIN FREE OF INFECTION/BREAKDOWN Description: Incision will remain intact and be free of infection/breakdown with min assist  Outcome: Progressing Goal: RH STG MAINTAIN SKIN INTEGRITY WITH ASSISTANCE Description: STG Maintain Skin Integrity With min Assistance. Outcome: Progressing Goal: RH STG ABLE TO PERFORM INCISION/WOUND CARE W/ASSISTANCE Description: STG Able To Perform Incision/Wound Care With min Assistance. Outcome: Progressing   Problem: RH SAFETY Goal: RH STG ADHERE TO SAFETY PRECAUTIONS W/ASSISTANCE/DEVICE Description: STG Adhere to Safety Precautions With min Assistance/Device. Outcome: Progressing Goal: RH STG DECREASED RISK OF FALL WITH ASSISTANCE Description: STG Decreased Risk of Fall With cueing Assistance. Outcome: Progressing   Problem: RH PAIN MANAGEMENT Goal: RH STG PAIN MANAGED AT OR BELOW PT'S PAIN GOAL Description: Less than 4 with PRN medications min assist  Outcome: Progressing   Problem: RH KNOWLEDGE  DEFICIT SCI Goal: RH STG INCREASE KNOWLEDGE OF SELF CARE AFTER SCI Description: Patient will be able to manage medications, bowels, and diet/ lifestyle modifications to improve A1C of 7.8 from nursing education and nursing handouts independently  Outcome: Progressing

## 2023-03-11 NOTE — Patient Care Conference (Cosign Needed)
Inpatient RehabilitationTeam Conference and Plan of Care Update Date: 03/11/2023   Time: 4:09 PM    Patient Name: Jill Holt      Medical Record Number: [id number]  Date of Birth: 03-Jul-1970 Sex: Female         Room/Bed: 4M12C/4M12C-01 Payor Info: Payor: [place] MEDICAID PREPAID HEALTH PLAN / Plan: [place] MEDICAID HEALTHY BLUE / Product Type: *No Product type* /    Admit Date/Time:  03/07/2023  3:53 PM  Primary Diagnosis:  Lumbar radiculopathy  Hospital Problems: Principal Problem:   Lumbar radiculopathy    Expected Discharge Date: Expected Discharge Date: 03/20/23  Team Members Present: Physician leading conference: Dr. Genice Rouge Social Worker Present: Cecile Sheerer, LCSWA Nurse Present: Vedia Pereyra, RN PT Present: Bernie Covey, PT OT Present: Velia Meyer, OT     Current Status/Progress Goal Weekly Team Focus  Bowel/Bladder    Continent of bowel/bladder   Remain continent of bowel/bladder  Toilet every 4 hours and as needed   Swallow/Nutrition/ Hydration               ADL's   bathing-mod A: LB dressing mod A; transfers with minm A   mod I overall   BADLs, tranfsers, educaiton    Mobility   CGA-min a with rollator, BIL AFOs.   mod I overall  sit<>stand transfers, gait, endurance    Communication                Safety/Cognition/ Behavioral Observations               Pain    Uncontrolled pain of left leg/foot   Less than 4 with PRN and scheduled pain medications  Assess every 4 hours and prior to therapy   Skin    Incision to back without drainage   Incision will remain intact and be free of infection/breakdown   Assess every shift and PRN      Discharge Planning:  Pt will discharge to home with support from dtr that can provide intermittent supervision as she has a newborn, and unable to provide physical assistance. Pt needs to be Mod I/Supervision at discharge. PCS referral sent to insurance for aide support. Fam edu on Wed  (11/13) 1pm-3pm with pt dtr. SW will confirm there are no barriers to discharge.   Team Discussion: Lumbar radiculopathy.  Continent of bowel/bladder. Pain managed with scheduled and PRN medications. Incision to back, no drainage. Corset back brace OOB.  Hypoglycemia. Prefers to use rollater over RW.  Addressing Mood/behavior. Bilateral PRAFO boots. Tolerating carb-mod diet. AC/HS.  Patient on target to meet rehab goals: yes, progressing towards goals with discharge date of 03/20/23  *See Care Plan and progress notes for long and short-term goals.   Revisions to Treatment Plan:  Watching B/P and blood sugar.  Parameters to insulin. Consult neuro-psych. Monitor labs/VS Teaching Needs: Medications, safety, self care, gait/transfer training, diet/lifestyle modifications, incision care, etc.   Current Barriers to Discharge: Decreased caregiver support and Wound care  Possible Resolutions to Barriers: Family education 03/12/23 Independent with corset application and incision care Order recommended DME     Medical Summary Current Status: CBgs dropping when doesn't eat- LLE and foot pain- L foot is swollen- TTP-  Barriers to Discharge: Medical stability;Morbid Obesity;Self-care education;Uncontrolled Pain;Weight bearing restrictions;Uncontrolled Diabetes;Behavior/Mood;Complicated Wound;Inadequate Nutritional Intake;Symptomatic Anemia  Barriers to Discharge Comments: limited by low CBGs; self limiting- back and forth from almost manic to depressed/anxious- pain and B/L Foot drop- and needs to be mod I Possible Resolutions to [employer]  Focus: family education tomorrow- daughter just had baby- AFO consult written- refused RW- wants Rolator- d/c-  11/21   Continued Need for Acute Rehabilitation Level of Care: The patient requires daily medical management by a physician with specialized training in physical medicine and rehabilitation for the following reasons: Direction of a multidisciplinary  physical rehabilitation program to maximize functional independence : Yes Medical management of patient stability for increased activity during participation in an intensive rehabilitation regime.: Yes Analysis of laboratory values and/or radiology reports with any subsequent need for medication adjustment and/or medical intervention. : Yes   I attest that I was present, lead the team conference, and concur with the assessment and plan of the team.   Jearld Adjutant 03/11/2023, 4:09 PM

## 2023-03-11 NOTE — Progress Notes (Signed)
PROGRESS NOTE   Subjective/Complaints:  Pt reports doing much better this AM CBGs better-  LBM yesterday No urination issues Pain better so far this AM    ROS-   Pt denies SOB, abd pain, CP, N/V/C/D, and vision changes   Except for HPI  Objective:   VAS [place] LOWER EXTREMITY VENOUS (DVT)  Result Date: 03/09/2023  Lower Venous DVT Study Patient Name:  DAISEY RAMAKRISHNAN  Date of Exam:   03/09/2023 Medical Rec #: [id number]       Accession #:    [id number] Date of Birth: February 21, 1971       Patient Gender: F Patient Age:   52 years Exam Location:  [facility] Procedure:      VAS [place] LOWER EXTREMITY VENOUS (DVT) Referring Phys: Mariam Dollar --------------------------------------------------------------------------------  Indications: Rehab patient - swelling.  Risk Factors: Decreased mobility/activity Surgery Lumbar decompressive laminectomy redo & transforaminal lumbar interfoby fusion (03/03/2023). Limitations: Suboptimal patient positioning for RLE portion of exam. Comparison Study: No previous exams Performing Technologist: Jody Hill RVT, RDMS  Examination Guidelines: A complete evaluation includes B-mode imaging, spectral Doppler, color Doppler, and power Doppler as needed of all accessible portions of each vessel. Bilateral testing is considered an integral part of a complete examination. Limited examinations for reoccurring indications may be performed as noted. The reflux portion of the exam is performed with the patient in reverse Trendelenburg.  +---------+---------------+---------+-----------+----------+-------------------+ RIGHT    CompressibilityPhasicitySpontaneityPropertiesThrombus Aging      +---------+---------------+---------+-----------+----------+-------------------+ CFV      Full           Yes      Yes                                       +---------+---------------+---------+-----------+----------+-------------------+ SFJ      Full                                                             +---------+---------------+---------+-----------+----------+-------------------+ FV Prox  Full           Yes      Yes                                      +---------+---------------+---------+-----------+----------+-------------------+ FV Mid   Full           Yes      Yes                                      +---------+---------------+---------+-----------+----------+-------------------+ FV DistalFull           Yes      Yes                                      +---------+---------------+---------+-----------+----------+-------------------+  PFV      Full                                                             +---------+---------------+---------+-----------+----------+-------------------+ POP      Full           Yes      Yes                                      +---------+---------------+---------+-----------+----------+-------------------+ PTV      Full                                                             +---------+---------------+---------+-----------+----------+-------------------+ PERO                                                  Not well visualized +---------+---------------+---------+-----------+----------+-------------------+   +---------+---------------+---------+-----------+----------+--------------+ LEFT     CompressibilityPhasicitySpontaneityPropertiesThrombus Aging +---------+---------------+---------+-----------+----------+--------------+ CFV      Full           Yes      Yes                                 +---------+---------------+---------+-----------+----------+--------------+ SFJ      Full                                                        +---------+---------------+---------+-----------+----------+--------------+ FV Prox  Full           Yes       Yes                                 +---------+---------------+---------+-----------+----------+--------------+ FV Mid   Full           Yes      Yes                                 +---------+---------------+---------+-----------+----------+--------------+ FV DistalFull           Yes      Yes                                 +---------+---------------+---------+-----------+----------+--------------+ PFV      Full                                                        +---------+---------------+---------+-----------+----------+--------------+  POP      Full           Yes      Yes                                 +---------+---------------+---------+-----------+----------+--------------+ PTV      Full                                                        +---------+---------------+---------+-----------+----------+--------------+ PERO     Full                                                        +---------+---------------+---------+-----------+----------+--------------+     Summary: BILATERAL: - No evidence of deep vein thrombosis seen in the lower extremities, bilaterally. -No evidence of popliteal cyst, bilaterally.   *See table(s) above for measurements and observations. Electronically signed by Carolynn Sayers on 03/09/2023 at 4:06:42 PM.    Final    Recent Labs    03/10/23 0508  WBC 7.5  HGB 8.9*  HCT 27.6*  PLT 252   Recent Labs    03/10/23 0508  NA 135  K 4.2  CL 97*  CO2 25  GLUCOSE 124*  BUN 13  CREATININE 0.93  CALCIUM 8.6*    Intake/Output Summary (Last 24 hours) at 03/11/2023 0914 Last data filed at 03/11/2023 0800 Gross per 24 hour  Intake 712 ml  Output --  Net 712 ml        Physical Exam: Vital Signs Blood pressure 136/84, pulse 100, temperature 98.8 F (37.1 C), resp. rate 17, height 5\' 4"  (1.626 m), weight 90.6 kg, last menstrual period 05/31/2022, SpO2 99%.     General: awake, alert, appropriate, sitting up in bedside  chair; NAD HENT: conjugate gaze; oropharynx moist CV: regular rhythm, mildly tachycardic rate; no JVD Pulmonary: CTA B/L; no W/R/R- good air movement GI: soft, NT, ND, (+)BS Psychiatric: appropriate but extremely flat affect Neurological: Ox3   Skin: No evidence of breakdown, no evidence of rash- however has honeycomb on back- looks good- no drainage around dressing Neurologic: Cranial nerves II through XII intact, motor strength is 5/5 in bilateral deltoid, bicep, tricep, grip, Pain limits MMT to 2- hip flexor, knee extensors, ankle dorsiflexor and plantar flexor Sensory exam hypersensitive to LT L5-S1 , intact LT L2-S1  Musculoskeletal: Full range of motion in all 4 extremities. No joint swelling   Assessment/Plan: 1. Functional deficits which require 3+ hours per day of interdisciplinary therapy in a comprehensive inpatient rehab setting. Physiatrist is providing close team supervision and 24 hour management of active medical problems listed below. Physiatrist and rehab team continue to assess barriers to discharge/monitor patient progress toward functional and medical goals  Care Tool:  Bathing    Body parts bathed by patient: Right arm, Left arm, Chest, Abdomen, Front perineal area, Face, Right upper leg, Left upper leg   Body parts bathed by helper: Right lower leg, Left lower leg, Buttocks     Bathing assist Assist Level: Moderate Assistance - Patient 50 - 74%     Upper Body Dressing/Undressing Upper  body dressing   What is the patient wearing?: Hospital gown only    Upper body assist Assist Level: Set up assist    Lower Body Dressing/Undressing Lower body dressing      What is the patient wearing?: Hospital gown only     Lower body assist Assist for lower body dressing: Set up assist     Toileting Toileting    Toileting assist Assist for toileting: Contact Guard/Touching assist     Transfers Chair/bed transfer  Transfers assist     Chair/bed  transfer assist level: Minimal Assistance - Patient > 75%     Locomotion Ambulation   Ambulation assist      Assist level: Minimal Assistance - Patient > 75% Assistive device: Walker-rolling Max distance: 132   Walk 10 feet activity   Assist     Assist level: Contact Guard/Touching assist Assistive device: Walker-rolling   Walk 50 feet activity   Assist Walk 50 feet with 2 turns activity did not occur: Safety/medical concerns         Walk 150 feet activity   Assist Walk 150 feet activity did not occur: Safety/medical concerns         Walk 10 feet on uneven surface  activity   Assist Walk 10 feet on uneven surfaces activity did not occur: Safety/medical concerns         Wheelchair     Assist Is the patient using a wheelchair?: Yes Type of Wheelchair: Manual    Wheelchair assist level: Dependent - Patient 0% Max wheelchair distance: 150    Wheelchair 50 feet with 2 turns activity    Assist        Assist Level: Dependent - Patient 0%   Wheelchair 150 feet activity     Assist      Assist Level: Dependent - Patient 0%   Blood pressure 136/84, pulse 100, temperature 98.8 F (37.1 C), resp. rate 17, height 5\' 4"  (1.626 m), weight 90.6 kg, last menstrual period 05/31/2022, SpO2 99%.  Medical Problem List and Plan: 1. 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  Status post redo decompressive laminectomies L3-4 and L4-5 with complete facetectomies and radical foraminotomies , L3-03/03/2023 LE weakness is in part due to pain, will optimize regimen              -patient may shower             -ELOS/Goals: 8-11 days with mod I goals PT, OT             -PRAFO's for both LE's at night             -will need AFO's for gait  Con't CIR PT and OT  Team conference today to determine length of stay 2.  Antithrombotics: -DVT/anticoagulation:  Mechanical:  Antiembolism  stockings, knee (TED hose) Bilateral lower extremities.  Check vascular study.  Discussed with neurosurgery on beginning Lovenox 11/11- Dopplers (-)               -antiplatelet therapy: N/A 3. Pain Management: currently on 600 mg twice daily, Flexeril and hydrocodone as needed             -pt was on gabapentin 900mg  tid at home--increase to 600mg  tid to start  11/11- pain 6/10 without meds this AM- gets better with meds  11/12- pain doing better this Am- con't regimen 4. Mood/Behavior/Sleep: Provide emotional support- labile this am , "feels overwhelmed by  medical issues "             -antipsychotic agents: N/A 5. Neuropsych/cognition: This patient is capable of making decisions on her own behalf. 6. Skin/Wound Care: Routine skin checks 7. Fluids/Electrolytes/Nutrition: Routine in and outs with follow-up chemistries 8.  Hypertension.  Norvasc 10 mg daily, Cozaar 100 mg daily, hydrochlorothiazide 12.5 mg daily, Toprol-XL 25 mg daily.  Monitor with increased mobility  11/11- BP controlled after meds taken for AM- will monitor- is labile before/after meds Vitals:   03/11/23 0420 03/11/23 0421  BP: (!) 148/84 136/84  Pulse: (!) 105 100  Resp: 17 17  Temp: 98.8 F (37.1 C) 98.8 F (37.1 C)  SpO2: (!) 86% 99%    9.  Diabetes mellitus.  Hemoglobin A1c 7.8.  NovoLog Mix 70/30 35 units with breakfast, 25 units with supper, Glucotrol XL 10 mg daily. CBG (last 3)  Recent Labs    03/10/23 1135 03/10/23 1636 03/11/23 0644  GLUCAP 128* 174* 139*  Controlled 11/9  11/11- CBGs dropped to 44/34 last night due to not eating dinner and family not bringing in food like planned- will order to give 70/30 only if BG >120- and has food- changed both orders  11/12- Much better Cbgs- will con't to hold 70/30 if needed 10.  Hyperlipidemia.  Lipitor 11.  Constipation.  Colace twice daily, MiraLAX BID, have added Amitiza for OIC, give dulc supp this pm if no BM   11/11- had BM yesterday feeling much  better  11/12- LBM yesterday  12. Mild hypoxia:             -wean oxygen as able             -check CBC in am 13. C/o dizziness while up with therapy. Sinus tachy on exam             -CBC as above, check BMET as well             -encourage fluids   11/12- Still mildly tachycardic- even at rest- might be anemia- last Hb 8.9 (getting better from prior 8.5,) but pt used to 12-13 Hb-    14.  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   LOS: 4 days A FACE TO FACE EVALUATION WAS PERFORMED  Lynnell Fiumara 03/11/2023, 9:14 AM

## 2023-03-12 LAB — GLUCOSE, CAPILLARY
Glucose-Capillary: 94 mg/dL (ref 70–99)
Glucose-Capillary: 217 mg/dL — ABNORMAL HIGH (ref 70–99)
Glucose-Capillary: 282 mg/dL — ABNORMAL HIGH (ref 70–99)
Glucose-Capillary: 136 mg/dL — ABNORMAL HIGH (ref 70–99)

## 2023-03-12 MED ORDER — METOPROLOL SUCCINATE ER 50 MG PO TB24
50.0000 mg | ORAL_TABLET | Freq: Every day | ORAL | Status: DC
  Administered 2023-03-13 – 2023-03-15 (×3): 50 mg via ORAL
  Filled 2023-03-12 (×4): qty 1

## 2023-03-12 NOTE — Progress Notes (Signed)
Occupational Therapy Session Note  Patient Details  Name: Jill Holt MRN: [id number] Date of Birth: 11-12-1970  Today's Date: 03/12/2023 OT Individual Time: 6440-3474 OT Individual Time Calculation (min): 70 min    Short Term Goals: Week 1:  OT Short Term Goal 1 (Week 1): Pt will complete LB bathing with Min A + LRAD. OT Short Term Goal 2 (Week 1): Pt will complete LB dressing with Min A + LRAD. OT Short Term Goal 3 (Week 1): Pt will verbalized at least 2 alternative pain management strategies outside of medication.  Skilled Therapeutic Interventions/Progress Updates:    Pt resting in w/c upon arrival. Pt reported she already "cleaned up" and changing clothing. Educated pt on donning Ted hose. Pt looking for shoes online for purchase to use with bil AFOs. Sit<>stand from w/c with CGA. Standing balance with close supervision. Continued discharge planning and reviewed DME recommendations. Amb in room with CGA. Pt remained in w/c with all needs within reach.   Therapy Documentation Precautions:  Precautions Precautions: Back Precaution Comments: Pt able to recall 3/3 precautions. Required Braces or Orthoses: Other Brace Other Brace: No brace ordered, LSO present in room and pt reports Dr. Wynetta Emery had her get one prior to surgery. Restrictions Weight Bearing Restrictions: No   Pain: Pt c/o 5/10 LUE/foot pain; Ted hose donned and repositioned   Therapy/Group: Individual Therapy  Rich Brave 03/12/2023, 9:25 AM

## 2023-03-12 NOTE — Progress Notes (Signed)
Physical Therapy Session Note  Patient Details  Name: Jill Holt MRN: [id number] Date of Birth: [birth year]  Today's Date: 03/12/2023 PT Individual Time: 1020-1105 and 1425 - 1515 PT Individual Time Calculation (min): 45 min and 50 min  Short Term Goals: Week 1:  PT Short Term Goal 1 (Week 1): pt will perform bed mobiltiy with min a PT Short Term Goal 2 (Week 1): Pt will ambulate 100 ft with assist PT Short Term Goal 3 (Week 1): Pt will navigate 3" stairs with assist  Skilled Therapeutic Interventions/Progress Updates: Tx1: Pt presented in w/c agreeable to therapy. Pt states has same feeling (nauseous/GI issues) that she had prior to surgery. Did not rate pain. Pt spent several minutes ordering new shoes that can be worn with AFO. Once completed PTA donned ace bandages for DF assist as pt unable to don shoes at this time. PTA also obtained ELR and discussed with pt can be used intermittently while in w/c although pt indicates spends more time in recliner than w/c. Pt then ambulated to main gym with rollator and CGA. Pt with mild compensatory gait to allow for DF clearance. Pt demonstrated good safety with rollator although brakes do not engage. Pt then instructed and able to perform hamstring stretch with 4in block under pt's foot. Pt requiring min verbal cues for technique and performed 1 min x 3 bilaterally. Pt indicated improvement in pain/stiffness after stretching. Pt also worked on Sit to stand without AD for BLE strengthening. Pt ambulated back to room in same manner as prior and at EOB required minA for BLE management for sit to supine. Pt able to reposition to comfort and left with bed alarm on, call bell within reach and needs met.   Tx2: Pt presented in w/c with dgt present agreeable to therapy. Pt denies pain at rest. Session focused on functional mobility with family education. Pt with only R shoe one due to swelling. PTA was able to loosen laces enough to don L shoe without  significant pain. Briefly discussed pt's CLOF with dgt and advised still have a week until d/c so potential for improved progress. Pt ambulated to ortho gym with rollator and CGA with increased time to perform Sit to stand. In ortho gym pt was able to complete car transfer to SUV height with minA for BLE management. Pt then ambulated to main gym and after seated rest ascended/descended x 4 steps with B rails and minA.  Pt was unable to lift LLE to 6in step but was able to lift RLE therefore performed with step to pattern leading with RLE. Discussed methods that pt could trial while here to make single rail ascending/descending safer. PTA demonstrated side stepping using L rail and pt advised may want to try L rail and SPC. Also discussed upon d/c at home at night having assist (supervision) for transfers to [facility] overnight. Pt and dgt voiced understanding. Pt then ambulated back to room and returned to w/c. Pt requesting if dgt can assist to take shower. Discussed with nsg and pt agreeable to have staff present FOR TRANSFERS ONLY and dgt can assist with shower. Pt left in w/c at end of session with dgt and nsg present and current needs met.      Therapy Documentation Precautions:  Precautions Precautions: Back Precaution Comments: Pt able to recall 3/3 precautions. Required Braces or Orthoses: Other Brace Other Brace: No brace ordered, LSO present in room and pt reports Dr. Wynetta Emery had her get one prior to surgery. Restrictions Weight  Bearing Restrictions: No General:   Vital Signs:   Pain: Pain Assessment Pain Scale: 0-10 Pain Score: 5  Pain Type: Chronic pain Pain Location: Foot Pain Orientation: Left Patients Stated Pain Goal: 2 Pain Intervention(s): Medication (See eMAR)   Therapy/Group: Individual Therapy  Bridie Colquhoun 03/12/2023, 12:18 PM

## 2023-03-12 NOTE — Progress Notes (Signed)
PROGRESS NOTE   Subjective/Complaints:  Pt reports everything good- LBM 2 days ago, but needs to go now.  Pain doing better ROS-   Pt denies SOB, abd pain, CP, N/V/C/D, and vision changes   Except for HPI  Objective:   No results found. Recent Labs    03/10/23 0508  WBC 7.5  HGB 8.9*  HCT 27.6*  PLT 252   Recent Labs    03/10/23 0508  NA 135  K 4.2  CL 97*  CO2 25  GLUCOSE 124*  BUN 13  CREATININE 0.93  CALCIUM 8.6*    Intake/Output Summary (Last 24 hours) at 03/12/2023 1924 Last data filed at 03/12/2023 1759 Gross per 24 hour  Intake 956 ml  Output --  Net 956 ml        Physical Exam: Vital Signs Blood pressure (!) 142/88, pulse (!) 103, temperature 98.6 F (37 C), temperature source Oral, resp. rate 18, height 5\' 4"  (1.626 m), weight 90.6 kg, last menstrual period 05/31/2022, SpO2 98%.      General: awake, alert, appropriate, sitting up in bedside chair; wearing croset TLSO;  NAD HENT: conjugate gaze; oropharynx moist CV: regular rhythm, mildly tachycardic rate; no JVD Pulmonary: CTA B/L; no W/R/R- good air movement GI: soft, NT, ND, (+)BS Psychiatric: appropriate Neurological: Ox3    Skin: No evidence of breakdown, no evidence of rash- however has honeycomb on back- looks good- no drainage around dressing Neurologic: Cranial nerves II through XII intact, motor strength is 5/5 in bilateral deltoid, bicep, tricep, grip, Pain limits MMT to 2- hip flexor, knee extensors, ankle dorsiflexor and plantar flexor Sensory exam hypersensitive to LT L5-S1 , intact LT L2-S1  Musculoskeletal: Full range of motion in all 4 extremities. No joint swelling   Assessment/Plan: 1. Functional deficits which require 3+ hours per day of interdisciplinary therapy in a comprehensive inpatient rehab setting. Physiatrist is providing close team supervision and 24 hour management of active medical problems  listed below. Physiatrist and rehab team continue to assess barriers to discharge/monitor patient progress toward functional and medical goals  Care Tool:  Bathing    Body parts bathed by patient: Right arm, Left arm, Chest, Abdomen, Front perineal area, Face, Right upper leg, Left upper leg   Body parts bathed by helper: Right lower leg, Left lower leg, Buttocks     Bathing assist Assist Level: Moderate Assistance - Patient 50 - 74%     Upper Body Dressing/Undressing Upper body dressing   What is the patient wearing?: Hospital gown only    Upper body assist Assist Level: Set up assist    Lower Body Dressing/Undressing Lower body dressing      What is the patient wearing?: Hospital gown only     Lower body assist Assist for lower body dressing: Set up assist     Toileting Toileting    Toileting assist Assist for toileting: Contact Guard/Touching assist     Transfers Chair/bed transfer  Transfers assist     Chair/bed transfer assist level: Minimal Assistance - Patient > 75%     Locomotion Ambulation   Ambulation assist      Assist level: Minimal Assistance - Patient >  75% Assistive device: Walker-rolling Max distance: 132   Walk 10 feet activity   Assist     Assist level: Contact Guard/Touching assist Assistive device: Walker-rolling   Walk 50 feet activity   Assist Walk 50 feet with 2 turns activity did not occur: Safety/medical concerns         Walk 150 feet activity   Assist Walk 150 feet activity did not occur: Safety/medical concerns         Walk 10 feet on uneven surface  activity   Assist Walk 10 feet on uneven surfaces activity did not occur: Safety/medical concerns         Wheelchair     Assist Is the patient using a wheelchair?: Yes Type of Wheelchair: Manual    Wheelchair assist level: Dependent - Patient 0% Max wheelchair distance: 150    Wheelchair 50 feet with 2 turns activity    Assist         Assist Level: Dependent - Patient 0%   Wheelchair 150 feet activity     Assist      Assist Level: Dependent - Patient 0%   Blood pressure (!) 142/88, pulse (!) 103, temperature 98.6 F (37 C), temperature source Oral, resp. rate 18, height 5\' 4"  (1.626 m), weight 90.6 kg, last menstrual period 05/31/2022, SpO2 98%.  Medical Problem List and Plan: 1. 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  Status post redo decompressive laminectomies L3-4 and L4-5 with complete facetectomies and radical foraminotomies , L3-03/03/2023 LE weakness is in part due to pain, will optimize regimen              -patient may shower             -ELOS/Goals: 8-11 days with mod I goals PT, OT             -PRAFO's for both LE's at night             -will need AFO's for gait  D/c 11/21  Con't CIR PT and OT 2.  Antithrombotics: -DVT/anticoagulation:  Mechanical:  Antiembolism stockings, knee (TED hose) Bilateral lower extremities.  Check vascular study.  Discussed with neurosurgery on beginning Lovenox 11/11- Dopplers (-)               -antiplatelet therapy: N/A 3. Pain Management: currently on 600 mg twice daily, Flexeril and hydrocodone as needed             -pt was on gabapentin 900mg  tid at home--increase to 600mg  tid to start  11/13- pain getting better 4. Mood/Behavior/Sleep: Provide emotional support- labile this am , "feels overwhelmed by medical issues "             -antipsychotic agents: N/A 5. Neuropsych/cognition: This patient is capable of making decisions on her own behalf. 6. Skin/Wound Care: Routine skin checks 7. Fluids/Electrolytes/Nutrition: Routine in and outs with follow-up chemistries 8.  Hypertension.  Norvasc 10 mg daily, Cozaar 100 mg daily, hydrochlorothiazide 12.5 mg daily, Toprol-XL 25 mg daily.  Monitor with increased mobility  11/11- BP controlled after meds taken for AM- will monitor- is labile  before/after meds  11/13- HR low 100s and BP running 140s-160s- increased Toprol XL tl 50 mg daily since HR and BP elevated Vitals:   03/12/23 0459 03/12/23 1348  BP: (!) 162/84 (!) 142/88  Pulse: (!) 103   Resp: 18 18  Temp: 98.9 F (37.2 C) 98.6 F (37 C)  SpO2: 98% 98%    9.  Diabetes mellitus.  Hemoglobin A1c 7.8.  NovoLog Mix 70/30 35 units with breakfast, 25 units with supper, Glucotrol XL 10 mg daily. CBG (last 3)  Recent Labs    03/12/23 0616 03/12/23 1131 03/12/23 1628  GLUCAP 94 217* 282*  Controlled 11/9  11/11- CBGs dropped to 44/34 last night due to not eating dinner and family not bringing in food like planned- will order to give 70/30 only if BG >120- and has food- changed both orders  11/12- Much better Cbgs- will con't to hold 70/30 if needed  11/13- pt had them hold her 70/30 and BC's spiked- will monitor and follow trend 10.  Hyperlipidemia.  Lipitor 11.  Constipation.  Colace twice daily, MiraLAX BID, have added Amitiza for OIC, give dulc supp this pm if no BM   11/11- had BM yesterday feeling much better  11/12- LBM yesterday   11/13- said needed ot have BM- has not today- will give Sorbitol, etc tomorrow if no BM 12. Mild hypoxia:             -wean oxygen as able             -check CBC in am 13. C/o dizziness while up with therapy. Sinus tachy on exam             -CBC as above, check BMET as well             -encourage fluids   11/12- Still mildly tachycardic- even at rest- might be anemia- last Hb 8.9 (getting better from prior 8.5,) but pt used to 12-13 Hb-    14.  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

## 2023-03-12 NOTE — Progress Notes (Signed)
Occupational Therapy Session Note  Patient Details  Name: Jill Holt MRN: [id number] Date of Birth: [birth year]  Today's Date: 03/12/2023 OT Individual Time: 1300-1345 OT Individual Time Calculation (min): 45 min    Short Term Goals: Week 1:  OT Short Term Goal 1 (Week 1): Pt will complete LB bathing with Min A + LRAD. OT Short Term Goal 2 (Week 1): Pt will complete LB dressing with Min A + LRAD. OT Short Term Goal 3 (Week 1): Pt will verbalized at least 2 alternative pain management strategies outside of medication.  Skilled Therapeutic Interventions/Progress Updates:    Pt resting in bed upon arrival with dtr present for education. Supine>sit EOB with supervision using bed functions and with min verbal cues for technique. Pt requested to use toilet after LSO donned. Amb with RW to bathroom with CGA. Toileting with CGA. Pt returned to room and stood at sink to wash hands before sitting in w/c. W/c to bathroom to practice TTB transfers. Pt completed TTB transfer with supervision. Reviewed LTGs of mod I. Educated dtr on donning Ted hose for edema mgmt and DVT prevention. Pt and dtr verbalized understanding of all recommendations. Pt remained seated in w/c with dtr present to await further education.   Therapy Documentation Precautions:  Precautions Precautions: Back Precaution Comments: Pt able to recall 3/3 precautions. Required Braces or Orthoses: Other Brace Other Brace: No brace ordered, LSO present in room and pt reports Dr. Wynetta Emery had her get one prior to surgery. Restrictions Weight Bearing Restrictions: No Pain:  Pt denies pain this afternoon   Therapy/Group: Individual Therapy  Rich Brave 03/12/2023, 2:49 PM

## 2023-03-13 LAB — GLUCOSE, CAPILLARY
Glucose-Capillary: 115 mg/dL — ABNORMAL HIGH (ref 70–99)
Glucose-Capillary: 206 mg/dL — ABNORMAL HIGH (ref 70–99)
Glucose-Capillary: 173 mg/dL — ABNORMAL HIGH (ref 70–99)
Glucose-Capillary: 50 mg/dL — ABNORMAL LOW (ref 70–99)
Glucose-Capillary: 66 mg/dL — ABNORMAL LOW (ref 70–99)
Glucose-Capillary: 115 mg/dL — ABNORMAL HIGH (ref 70–99)

## 2023-03-13 MED ORDER — SORBITOL 70 % SOLN
30.0000 mL | Freq: Once | Status: AC
  Administered 2023-03-13: 30 mL via ORAL

## 2023-03-13 MED ORDER — SENNA 8.6 MG PO TABS
2.0000 | ORAL_TABLET | Freq: Every day | ORAL | Status: DC
  Administered 2023-03-13 – 2023-03-17 (×4): 17.2 mg via ORAL
  Filled 2023-03-13 (×7): qty 2

## 2023-03-13 MED ORDER — GABAPENTIN 300 MG PO CAPS
300.0000 mg | ORAL_CAPSULE | Freq: Three times a day (TID) | ORAL | Status: DC
  Administered 2023-03-13 – 2023-03-20 (×22): 300 mg via ORAL
  Filled 2023-03-13 (×22): qty 1

## 2023-03-13 NOTE — Evaluation (Signed)
Recreational Therapy Assessment and Plan  Patient Details  Name: Jill Holt MRN: [id number] Date of Birth: 1970/11/21 Today's Date: 03/13/2023  Rehab Potential:  Good ELOS:   d/c 11/21  Assessment  Hospital Problem: Principal Problem:   Lumbar radiculopathy     Past Medical History:      Past Medical History:  Diagnosis Date   Acute urinary retention     Anemia      Iron Deficiency Anemia   Arthritis     Constipation, acute      s/p each surgery   COVID 09/2018    had pneumonia   Diabetes mellitus without complication (HCC)     GERD (gastroesophageal reflux disease)     Hypertension     Pneumonia          Past Surgical History:       Past Surgical History:  Procedure Laterality Date   CARPAL TUNNEL RELEASE Bilateral     FEMUR FRACTURE SURGERY Left     ganglion cyst removal Right      Hand   LUMBAR LAMINECTOMY/DECOMPRESSION MICRODISCECTOMY Left 07/24/2022    Procedure: Sublaminar decompression - left - Lumbar Three-Lumbar Four - Lumbar Four-Lumbar Five;  Surgeon: Donalee Citrin, MD;  Location: [facility] OR;  Service: Neurosurgery;  Laterality: Left;  3C   TUBAL LIGATION        1990s          Assessment & Plan Clinical Impression: Jill Holt, is a 52 year old right-handed female with history of hypertension, diabetes mellitus, iron deficiency anemia as well as history of lumbar L3-4 and L4-5 laminotomies.  Per chart review patient lives with her children.  1 level home 3 steps to entry.  She has using a rolling walker since March due to bilateral foot drop.  She essentially sponge bathes for her ADLs.  Presented 03/03/2023 with bilateral hip and leg pain.  Workup and imaging revealed degenerative disc disease spinal stenosis L3-4 and 4-5.  Underwent redo decompressive laminectomies L3-4 and L4-L5 with complete facetectomies and radical foraminotomies of L3-4-5 nerve roots bilaterally by Dr. Wynetta Emery 03/03/2023 as well as transforaminal lumbar interbody fusions L3-4 and L5 with  screw fixation and posterior lateral arthrodesis.  SCDs in place for DVT prophylaxis.  No postoperative orders were given for a brace.  Therapy evaluations completed due to patient decreased functional mobility was admitted for a comprehensive rehab program.   Pt reports dizziness and sob at times when she's been up to ambulate. Is currently on 2L oxygen for sats down to 91. Denies SOB at rest.   Patient transferred to CIR on 03/07/2023 .    Pt presents with decreased activity tolerance, decreased functional mobility, decreased balance, decreased coordination, difficulty maintaining precautions. Met with pt today to discuss TR services including leisure education, activity analysis/modifications and stress management.  Also discussed the importance of social, emotional, spiritual health in addition to physical health and their effects on overall health and wellness.  Pt stated understanding.   Plan  No further TR. Will continue to monitor  Recommendations for other services: None   Discharge Criteria: Patient will be discharged from TR if patient refuses treatment 3 consecutive times without medical reason.  If treatment goals not met, if there is a change in medical status, if patient makes no progress towards goals or if patient is discharged from hospital.  The above assessment, treatment plan, treatment alternatives and goals were discussed and mutually agreed upon: by patient  Deborha Moseley 03/13/2023, 8:25 AM

## 2023-03-13 NOTE — Progress Notes (Signed)
Hypoglycemic Event  CBG: 50  Treatment: 4 oz juice/soda  Symptoms: None  Follow-up CBG: Time:2114 CBG Result:66  Possible Reasons for Event: Unknown  Comments/MD notified:will monitor    Tiarah Shisler, [employer]

## 2023-03-13 NOTE — Progress Notes (Signed)
Occupational Therapy Session Note  Patient Details  Name: Jill Holt MRN: [id number] Date of Birth: February 21, 1971  Today's Date: 03/13/2023 OT Individual Time: 0930-1040 OT Individual Time Calculation (min): 70 min    Short Term Goals: Week 1:  OT Short Term Goal 1 (Week 1): Pt will complete LB bathing with Min A + LRAD. OT Short Term Goal 2 (Week 1): Pt will complete LB dressing with Min A + LRAD. OT Short Term Goal 3 (Week 1): Pt will verbalized at least 2 alternative pain management strategies outside of medication.  Skilled Therapeutic Interventions/Progress Updates:    PT resting in w/c upon arrival. OT intervention with focus on functional amb with rollator, standing balance, activity tolerance, safety awareness, and discharge planning to increase indepdnence with BADLs. Amb with Rollator to gym>day room>return to room. All amb with close supervision. Standing activities/tasks: tossing ball against rebounder-soccer ball and 1kg ball while standing on compliant/noncompliant surface; Wii bowling with pt remained standing througout complete 2 person game. All standing activities with CGA and no LOB noted. Pt returned to room and remained in w/c with all needs within reach. BLE elevated.   Therapy Documentation Precautions:  Precautions Precautions: Back Precaution Comments: Pt able to recall 3/3 precautions. Required Braces or Orthoses: Other Brace Other Brace: No brace ordered, LSO present in room and pt reports Dr. Wynetta Emery had her get one prior to surgery. Restrictions Weight Bearing Restrictions: No   Pain:  Pt initially reported no pain at beginning of session but requested pain meds from RN at end of session. Pain at 5/10 across back.   Therapy/Group: Individual Therapy  Rich Brave 03/13/2023, 10:45 AM

## 2023-03-13 NOTE — Progress Notes (Signed)
Patient refused Pra[employer] boots tonight. Per patient she is not able to sleep well with them. Verbal education provided.

## 2023-03-13 NOTE — Progress Notes (Signed)
Hypoglycemic Event  CBG: 66  Treatment: 4 oz juice/soda  Symptoms: Pale and None  Follow-up CBG: Time:2144 CBG Result:115  Possible Reasons for Event: Unknown  Comments/MD notified:hypoglycemic protocol initiated    Jill Holt, [employer]

## 2023-03-13 NOTE — Progress Notes (Signed)
Physical Therapy Session Note  Patient Details  Name: Jill Holt MRN: [id number] Date of Birth: [birth year]  Today's Date: 03/13/2023 PT Individual Time: 1300-1415 PT Individual Time Calculation (min): 75 min   Short Term Goals: Week 1:  PT Short Term Goal 1 (Week 1): pt will perform bed mobiltiy with min a PT Short Term Goal 2 (Week 1): Pt will ambulate 100 ft with assist PT Short Term Goal 3 (Week 1): Pt will navigate 3" stairs with assist  Skilled Therapeutic Interventions/Progress Updates:    Pt seated in w/c on arrival and agreeable to therapy. No complaint of pain. Pt ambulated to day room with rollator and CGA-min A. Cues for upright posture. Pt required consistent cues for pushing from seat/reaching back this session. Session focused on strengthening activities for improving hip flexion and mechanics for stair navigation, with and without weight. Pt also participated in step ups with min fading to to mod A with fatigue. Pt returned to room and doffed pants, shoes, and bra before returning to bed with min A. Pt was left with all needs in reach and alarm active.   Therapy Documentation Precautions:  Precautions Precautions: Back Precaution Comments: Pt able to recall 3/3 precautions. Required Braces or Orthoses: Other Brace Other Brace: No brace ordered, LSO present in room and pt reports Dr. Wynetta Emery had her get one prior to surgery. Restrictions Weight Bearing Restrictions: No General:       Therapy/Group: Individual Therapy  [name] 03/13/2023, 4:05 PM

## 2023-03-13 NOTE — Progress Notes (Signed)
PROGRESS NOTE   Subjective/Complaints:   Pt reports swelling from gabapentin- pain not as bad, and throbbing rate- every 2- days now- so wants to decrease gabapentin since causing a lot of swelling of legs. On Norvasc but said never had swelling issues while on Norvasc.    Also miralax makes her gum feel scratchy- wants to stop.  No BM in 3 days.  ROS-   Pt denies SOB, abd pain, CP, N/V/ (+)C/D, and vision changes   Except for HPI  Objective:   No results found. No results for input(s): "WBC", "HGB", "HCT", "PLT" in the last 72 hours.  No results for input(s): "NA", "K", "CL", "CO2", "GLUCOSE", "BUN", "CREATININE", "CALCIUM" in the last 72 hours.   Intake/Output Summary (Last 24 hours) at 03/13/2023 0904 Last data filed at 03/13/2023 0726 Gross per 24 hour  Intake 956 ml  Output --  Net 956 ml        Physical Exam: Vital Signs Blood pressure (!) 152/84, pulse 100, temperature 99 F (37.2 C), temperature source Oral, resp. rate 18, height 5\' 4"  (1.626 m), weight 90.6 kg, last menstrual period 05/31/2022, SpO2 97%.       General: awake, alert, appropriate,sititng up in bed;  NAD HENT: conjugate gaze; oropharynx moist CV: regular rate; no JVD Pulmonary: CTA B/L; no W/R/R- good air movement GI: soft, NT, ND, (+)BS- hypoactive Psychiatric: appropriate Neurological: Ox3 Extremities- 2+ LE edema to mid calf B/L  Skin: No evidence of breakdown, no evidence of rash- however has honeycomb on back- looks good- no drainage around dressing Neurologic: Cranial nerves II through XII intact, motor strength is 5/5 in bilateral deltoid, bicep, tricep, grip, Pain limits MMT to 2- hip flexor, knee extensors, ankle dorsiflexor and plantar flexor Sensory exam hypersensitive to LT L5-S1 , intact LT L2-S1  Musculoskeletal: Full range of motion in all 4 extremities. No joint swelling   Assessment/Plan: 1. Functional  deficits which require 3+ hours per day of interdisciplinary therapy in a comprehensive inpatient rehab setting. Physiatrist is providing close team supervision and 24 hour management of active medical problems listed below. Physiatrist and rehab team continue to assess barriers to discharge/monitor patient progress toward functional and medical goals  Care Tool:  Bathing    Body parts bathed by patient: Right arm, Left arm, Chest, Abdomen, Front perineal area, Face, Right upper leg, Left upper leg   Body parts bathed by helper: Right lower leg, Left lower leg, Buttocks     Bathing assist Assist Level: Moderate Assistance - Patient 50 - 74%     Upper Body Dressing/Undressing Upper body dressing   What is the patient wearing?: Hospital gown only    Upper body assist Assist Level: Set up assist    Lower Body Dressing/Undressing Lower body dressing      What is the patient wearing?: Hospital gown only     Lower body assist Assist for lower body dressing: Set up assist     Toileting Toileting    Toileting assist Assist for toileting: Contact Guard/Touching assist     Transfers Chair/bed transfer  Transfers assist     Chair/bed transfer assist level: Minimal Assistance - Patient > 75%  Locomotion Ambulation   Ambulation assist      Assist level: Minimal Assistance - Patient > 75% Assistive device: Walker-rolling Max distance: 132   Walk 10 feet activity   Assist     Assist level: Contact Guard/Touching assist Assistive device: Walker-rolling   Walk 50 feet activity   Assist Walk 50 feet with 2 turns activity did not occur: Safety/medical concerns         Walk 150 feet activity   Assist Walk 150 feet activity did not occur: Safety/medical concerns         Walk 10 feet on uneven surface  activity   Assist Walk 10 feet on uneven surfaces activity did not occur: Safety/medical concerns         Wheelchair     Assist Is the  patient using a wheelchair?: Yes Type of Wheelchair: Manual    Wheelchair assist level: Dependent - Patient 0% Max wheelchair distance: 150    Wheelchair 50 feet with 2 turns activity    Assist        Assist Level: Dependent - Patient 0%   Wheelchair 150 feet activity     Assist      Assist Level: Dependent - Patient 0%   Blood pressure (!) 152/84, pulse 100, temperature 99 F (37.2 C), temperature source Oral, resp. rate 18, height 5\' 4"  (1.626 m), weight 90.6 kg, last menstrual period 05/31/2022, SpO2 97%.  Medical Problem List and Plan: 1. 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  Status post redo decompressive laminectomies L3-4 and L4-5 with complete facetectomies and radical foraminotomies , L3-03/03/2023 LE weakness is in part due to pain, will optimize regimen              -patient may shower             -ELOS/Goals: 8-11 days with mod I goals PT, OT             -PRAFO's for both LE's at night             -will need AFO's for gait  D/c 11/21  Cont' CIR PT and OT  2.  Antithrombotics: -DVT/anticoagulation:  Mechanical:  Antiembolism stockings, knee (TED hose) Bilateral lower extremities.  Check vascular study.  Discussed with neurosurgery on beginning Lovenox 11/11- Dopplers (-)               -antiplatelet therapy: N/A 3. Pain Management: currently on 600 mg twice daily, Flexeril and hydrocodone as needed             -pt was on gabapentin 900mg  tid at home--increase to 600mg  tid to start  11/13- pain getting better  11/14- will reduce gabapentin to 300 mg TID- and see if swelling improves- she feels with reduction in pain, can reduce gabapentin 4. Mood/Behavior/Sleep: Provide emotional support- labile this am , "feels overwhelmed by medical issues "             -antipsychotic agents: N/A 5. Neuropsych/cognition: This patient is capable of making decisions on her own behalf. 6.  Skin/Wound Care: Routine skin checks 7. Fluids/Electrolytes/Nutrition: Routine in and outs with follow-up chemistries 8.  Hypertension.  Norvasc 10 mg daily, Cozaar 100 mg daily, hydrochlorothiazide 12.5 mg daily, Toprol-XL 25 mg daily.  Monitor with increased mobility  11/11- BP controlled after meds taken for AM- will monitor- is labile before/after meds  11/13- HR low 100s and BP running 140s-160s- increased Toprol XL tl  50 mg daily since HR and BP elevated  11/14- BP better last night with increase in Metoprolol- however HR still low 100's- will give it a few days before making changes Vitals:   03/12/23 1946 03/13/23 0519  BP: 131/72 (!) 152/84  Pulse: 100 100  Resp: 18 18  Temp: 98.7 F (37.1 C) 99 F (37.2 C)  SpO2: 98% 97%    9.  Diabetes mellitus.  Hemoglobin A1c 7.8.  NovoLog Mix 70/30 35 units with breakfast, 25 units with supper, Glucotrol XL 10 mg daily. CBG (last 3)  Recent Labs    03/12/23 1628 03/12/23 2101 03/13/23 0611  GLUCAP 282* 136* 115*  Controlled 11/9  11/11- CBGs dropped to 44/34 last night due to not eating dinner and family not bringing in food like planned- will order to give 70/30 only if BG >120- and has food- changed both orders  11/12- Much better Cbgs- will con't to hold 70/30 if needed  11/13- pt had them hold her 70/30 and BC's spiked- will monitor and follow trend  11/14- Cbgs elevated x1- will monitor- usually when refuses 70/30 10.  Hyperlipidemia.  Lipitor 11.  Constipation.  Colace twice daily, MiraLAX BID, have added Amitiza for OIC, give dulc supp this pm if no BM   11/11- had BM yesterday feeling much better  11/12- LBM yesterday   11/13- said needed ot have BM- has not today- will give Sorbitol, etc tomorrow if no BM  11/14- Will give Sorbitol today and change Miralax to 2 senna daily.  12. Mild hypoxia:             -wean oxygen as able             -check CBC in am 13. C/o dizziness while up with therapy. Sinus tachy on exam              -CBC as above, check BMET as well             -encourage fluids   11/12- Still mildly tachycardic- even at rest- might be anemia- last Hb 8.9 (getting better from prior 8.5,) but pt used to 12-13 Hb-    14.  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

## 2023-03-13 NOTE — Progress Notes (Signed)
Physical Therapy Session Note  Patient Details  Name: Jill Holt MRN: [id number] Date of Birth: 20-Apr-1971  Today's Date: 03/13/2023 PT Individual Time: 0805-0900 PT Individual Time Calculation (min): 55 min   Short Term Goals: Week 1:  PT Short Term Goal 1 (Week 1): pt will perform bed mobiltiy with min a PT Short Term Goal 2 (Week 1): Pt will ambulate 100 ft with assist PT Short Term Goal 3 (Week 1): Pt will navigate 3" stairs with assist  Skilled Therapeutic Interventions/Progress Updates: Pt presented in bathroom with NT agreeable to therapy. Pt denies pain at rest. PTA assisting in donning TED hose and socks for time management. Pt sttod with RW and supervision and completed pulling pants over hips with supervision. Completed ambulatory transfer to w/c with CGA. Once out of bathroom PTA donned shoes/AFO total A. Pt donned LSO with set up assist. Pt then ambulated to main gym with rollator and CGA fading to close supervision. In gym pt completing the following seated LE therex: LAQ with 2.5lb 2 x 10, AROM seated hip flexion 2 x 10, hamstring pulls with green theraband 2 x 10. Pt then ambulated to stairs and performed hip flexion to 3in step x 10 on RLE and hip flexion to second 3in step on LLE x 10. Pt also performed step ups forward and side stepping x 10 bilaterally. Pt did require seated rest breaks between each bout due to decreased endurance. Pt ambulated back to room and sat in w/c with PTA setting up ELRs for edema management. Pt left in w/c at end of session with call bell within reach and needs met.      Therapy Documentation Precautions:  Precautions Precautions: Back Precaution Comments: Pt able to recall 3/3 precautions. Required Braces or Orthoses: Other Brace Other Brace: No brace ordered, LSO present in room and pt reports Dr. Wynetta Emery had her get one prior to surgery. Restrictions Weight Bearing Restrictions: No   Therapy/Group: Individual Therapy  Kristine Tiley 03/13/2023, 9:36 AM

## 2023-03-13 NOTE — Plan of Care (Signed)
  Problem: Consults Goal: RH SPINAL CORD INJURY PATIENT EDUCATION Description:  See Patient Education module for education specifics.  Outcome: Progressing   Problem: SCI BOWEL ELIMINATION Goal: RH STG MANAGE BOWEL WITH ASSISTANCE Description: STG Manage Bowel with min Assistance. Outcome: Progressing Goal: RH STG SCI MANAGE BOWEL WITH MEDICATION WITH ASSISTANCE Description: STG SCI Manage bowel with medication with min assistance. Outcome: Progressing Goal: RH STG SCI MANAGE BOWEL PROGRAM W/ASSIST OR AS APPROPRIATE Description: STG SCI Manage bowel program w/min assist or as appropriate. Outcome: Progressing   Problem: SCI BLADDER ELIMINATION Goal: RH STG MANAGE BLADDER WITH ASSISTANCE Description: STG Manage Bladder With min Assistance Outcome: Progressing Goal: RH STG MANAGE BLADDER WITH MEDICATION WITH ASSISTANCE Description: STG Manage Bladder With Medication With min Assistance. Outcome: Progressing   Problem: RH SKIN INTEGRITY Goal: RH STG SKIN FREE OF INFECTION/BREAKDOWN Description: Incision will remain intact and be free of infection/breakdown with min assist  Outcome: Progressing Goal: RH STG MAINTAIN SKIN INTEGRITY WITH ASSISTANCE Description: STG Maintain Skin Integrity With min Assistance. Outcome: Progressing Goal: RH STG ABLE TO PERFORM INCISION/WOUND CARE W/ASSISTANCE Description: STG Able To Perform Incision/Wound Care With min Assistance. Outcome: Progressing   Problem: RH SAFETY Goal: RH STG ADHERE TO SAFETY PRECAUTIONS W/ASSISTANCE/DEVICE Description: STG Adhere to Safety Precautions With min Assistance/Device. Outcome: Progressing Goal: RH STG DECREASED RISK OF FALL WITH ASSISTANCE Description: STG Decreased Risk of Fall With cueing Assistance. Outcome: Progressing   Problem: RH PAIN MANAGEMENT Goal: RH STG PAIN MANAGED AT OR BELOW PT'S PAIN GOAL Description: Less than 4 with PRN medications min assist  Outcome: Progressing   Problem: RH KNOWLEDGE  DEFICIT SCI Goal: RH STG INCREASE KNOWLEDGE OF SELF CARE AFTER SCI Description: Patient will be able to manage medications, bowels, and diet/ lifestyle modifications to improve A1C of 7.8 from nursing education and nursing handouts independently  Outcome: Progressing

## 2023-03-14 DIAGNOSIS — G8929 Other chronic pain: Secondary | ICD-10-CM

## 2023-03-14 LAB — GLUCOSE, CAPILLARY
Glucose-Capillary: 86 mg/dL (ref 70–99)
Glucose-Capillary: 198 mg/dL — ABNORMAL HIGH (ref 70–99)
Glucose-Capillary: 262 mg/dL — ABNORMAL HIGH (ref 70–99)
Glucose-Capillary: 152 mg/dL — ABNORMAL HIGH (ref 70–99)

## 2023-03-14 MED ORDER — INSULIN ASPART PROT & ASPART (70-30 MIX) 100 UNIT/ML ~~LOC~~ SUSP
15.0000 [IU] | Freq: Every day | SUBCUTANEOUS | Status: DC
  Administered 2023-03-14: 15 [IU] via SUBCUTANEOUS

## 2023-03-14 NOTE — Plan of Care (Signed)
  Problem: Consults Goal: RH SPINAL CORD INJURY PATIENT EDUCATION Description:  See Patient Education module for education specifics.  Outcome: Progressing   Problem: SCI BOWEL ELIMINATION Goal: RH STG MANAGE BOWEL WITH ASSISTANCE Description: STG Manage Bowel with min Assistance. Outcome: Progressing Goal: RH STG SCI MANAGE BOWEL WITH MEDICATION WITH ASSISTANCE Description: STG SCI Manage bowel with medication with min assistance. Outcome: Progressing Goal: RH STG SCI MANAGE BOWEL PROGRAM W/ASSIST OR AS APPROPRIATE Description: STG SCI Manage bowel program w/min assist or as appropriate. Outcome: Progressing   Problem: SCI BLADDER ELIMINATION Goal: RH STG MANAGE BLADDER WITH ASSISTANCE Description: STG Manage Bladder With min Assistance Outcome: Progressing Goal: RH STG MANAGE BLADDER WITH MEDICATION WITH ASSISTANCE Description: STG Manage Bladder With Medication With min Assistance. Outcome: Progressing   Problem: RH SKIN INTEGRITY Goal: RH STG SKIN FREE OF INFECTION/BREAKDOWN Description: Incision will remain intact and be free of infection/breakdown with min assist  Outcome: Progressing Goal: RH STG MAINTAIN SKIN INTEGRITY WITH ASSISTANCE Description: STG Maintain Skin Integrity With min Assistance. Outcome: Progressing Goal: RH STG ABLE TO PERFORM INCISION/WOUND CARE W/ASSISTANCE Description: STG Able To Perform Incision/Wound Care With min Assistance. Outcome: Progressing   Problem: RH SAFETY Goal: RH STG ADHERE TO SAFETY PRECAUTIONS W/ASSISTANCE/DEVICE Description: STG Adhere to Safety Precautions With min Assistance/Device. Outcome: Progressing Goal: RH STG DECREASED RISK OF FALL WITH ASSISTANCE Description: STG Decreased Risk of Fall With cueing Assistance. Outcome: Progressing   Problem: RH PAIN MANAGEMENT Goal: RH STG PAIN MANAGED AT OR BELOW PT'S PAIN GOAL Description: Less than 4 with PRN medications min assist  Outcome: Progressing   Problem: RH KNOWLEDGE  DEFICIT SCI Goal: RH STG INCREASE KNOWLEDGE OF SELF CARE AFTER SCI Description: Patient will be able to manage medications, bowels, and diet/ lifestyle modifications to improve A1C of 7.8 from nursing education and nursing handouts independently  Outcome: Progressing

## 2023-03-14 NOTE — Progress Notes (Signed)
Patient ID: Jill Holt, female   DOB: Nov 06, 1970, 52 y.o.   MRN: [id number]  SW spoke with pt to discuss H[facility] preference. No preference.   SW sent out HHPT/OT/aide referral to [facility] and waiting on follow-up.   SW spoke with [facility] to discuss referral. Reports will discuss referral with branch and will follow-up with SW.  *referral accepted for HHPT/OT and will ask OT to address aide care needs.   Declined HHAs [facility]- no contracts Cheryl[facility]- no contracts [facility]- does not service area [facility]- not in network Amy/[facility]- not in network Erica[facility]- no staffing in area Tia[facility]- out of service area  Cecile Sheerer, MSW, LCSW Office: [phone] Cell: [phone] Fax: [phone]

## 2023-03-14 NOTE — Plan of Care (Signed)
  Problem: RH Dressing Goal: LTG Patient will perform lower body dressing w/assist (OT) Description: LTG: Patient will perform lower body dressing with assist, with/without cues in positioning using equipment (OT) Flowsheets (Taken 03/14/2023 1605) LTG: Pt will perform lower body dressing with assistance level of: (downgraded JLS) Minimal Assistance - Patient > 75% Note: downgraded JLS  for shoes and AFO   Problem: RH Simple Meal Prep Goal: LTG Patient will perform simple meal prep w/assist (OT) Description: LTG: Patient will perform simple meal prep with assistance, with/without cues (OT). Flowsheets (Taken 03/14/2023 1605) LTG: Pt will perform simple meal prep with assistance level of: (downgraded JLS) Supervision/Verbal cueing Note: downgraded JLS

## 2023-03-14 NOTE — Consult Note (Signed)
Neuropsychological Consultation Comprehensive Inpatient Rehab   Patient:   Jill Holt   DOB:   [birth year]  MR Number:  [id number]  Location:  [facility] [address] CENTER [address] [place] [address] Dept: [phone] Loc: [phone]           Date of Service:   03/13/2024  Start Time:   8 AM End Time:   9 AM  Provider/Observer:  Arley Phenix, Psy.D.       Clinical Neuropsychologist       Billing Code/Service: [phone]  Reason for Service:    Jill Holt is a 52 year old female referred for neuropsychological consultation during her ongoing admission onto the comprehensive inpatient rehabilitation unit.  The patient is having to deal with chronic pain and recent significant loss of motor function due to lumbar spinal issues.  Patient has a past medical history including hypertension, diabetes, anemia and history of previous lumbar laminectomies.  Patient has had progressive worsening in her functional status and had been using a rolling walker since March due to bilateral foot drop.  Patient presented 03/03/2023 with bilateral hip pain and leg pain.  Workup revealed significant spinal stenosis and patient underwent decompressive laminectomies on 03/03/2023.  Postoperatively, the patient has continued with significant functional deficits primarily around motor functioning of her bilateral lower extremities.  Dropfoot, strength deficits, gait disturbance are all prevalent.  Patient has been trying to cope with this progressive worsening of her condition and with stenosis developing needed emergent intervention.  Patient described coping strategies she has been employed and even before the most recent surgery with significant loss of function.  Patient has family support but continues to have a great deal of difficulty with loss of function.  Patient has had chronic pain and motor deficits for some time and these motor  deficits continue postsurgical interventions.  Patient's medical status is complicated by rather brittle diabetic status.  HPI for the current admission:    HPI: Jill Holt, is a 52 year old right-handed female with history of hypertension, diabetes mellitus, iron deficiency anemia as well as history of lumbar L3-4 and L4-5 laminotomies. Per chart review patient lives with her children. 1 level home 3 steps to entry. She has using a rolling walker since March due to bilateral foot drop. She essentially sponge bathes for her ADLs. Presented 03/03/2023 with bilateral hip and leg pain. Workup and imaging revealed degenerative disc disease spinal stenosis L3-4 and 4-5. Underwent redo decompressive laminectomies L3-4 and L4-L5 with complete facetectomies and radical foraminotomies of L3-4-5 nerve roots bilaterally by Dr. Wynetta Emery 03/03/2023 as well as transforaminal lumbar interbody fusions L3-4 and L5 with screw fixation and posterior lateral arthrodesis. SCDs in place for DVT prophylaxis. No postoperative orders were given for a brace. Therapy evaluations completed due to patient decreased functional mobility was admitted for a comprehensive rehab program.   Medical History:   Past Medical History:  Diagnosis Date   Acute urinary retention    Anemia    Iron Deficiency Anemia   Arthritis    Constipation, acute    s/p each surgery   COVID 09/2018   had pneumonia   Diabetes mellitus without complication (HCC)    GERD (gastroesophageal reflux disease)    Hypertension    Pneumonia          Patient Active Problem List   Diagnosis Date Noted   Coping with chronic pain 03/14/2023   Lumbar radiculopathy 03/07/2023   Spinal stenosis of  lumbar region 07/24/2022    Behavioral Observation/Mental Status:   Jill Holt  presents as a 52 y.o.-year-old Right handed African American Female who appeared her stated age. her dress was Appropriate and she was Well Groomed and her manners were Appropriate to  the situation.  her participation was indicative of Appropriate and Attentive behaviors.  There were physical disabilities noted.  she displayed an appropriate level of cooperation and motivation.    Interactions:    Active Appropriate  Attention:   within normal limits and attention span and concentration were age appropriate  Memory:   within normal limits; recent and remote memory intact  Visuo-spatial:   not examined  Speech (Volume):  normal  Speech:   normal; normal  Thought Process:  Coherent and Relevant  Coherent, Directed, and Logical  Though Content:  WNL; not suicidal and not homicidal  Orientation:   person, place, time/date, and situation  Judgment:   Good  Planning:   Fair  Affect:    Appropriate  Mood:    Dysphoric  Insight:   Good  Intelligence:   normal  Psychiatric History:  No prior psychiatric history noted  Family Med/Psych History: History reviewed. No pertinent family history.   Impression/DX:   Jill Holt is a 52 year old female referred for neuropsychological consultation during her ongoing admission onto the comprehensive inpatient rehabilitation unit.  The patient is having to deal with chronic pain and recent significant loss of motor function due to lumbar spinal issues.  Patient has a past medical history including hypertension, diabetes, anemia and history of previous lumbar laminectomies.  Patient has had progressive worsening in her functional status and had been using a rolling walker since March due to bilateral foot drop.  Patient presented 03/03/2023 with bilateral hip pain and leg pain.  Workup revealed significant spinal stenosis and patient underwent decompressive laminectomies on 03/03/2023.  Postoperatively, the patient has continued with significant functional deficits primarily around motor functioning of her bilateral lower extremities.  Dropfoot, strength deficits, gait disturbance are all prevalent.  Patient has been trying to  cope with this progressive worsening of her condition and with stenosis developing needed emergent intervention.  Patient described coping strategies she has been employed and even before the most recent surgery with significant loss of function.  Patient has family support but continues to have a great deal of difficulty with loss of function.  Patient has had chronic pain and motor deficits for some time and these motor deficits continue postsurgical interventions.  Patient's medical status is complicated by rather brittle diabetic status.  Disposition/Plan:  Today we worked on coping and adjustment issues with significant loss of functional capacity and while the patient is improving this is a slow progression with unknown in-state.  Patient has utilized a number of effective coping strategies over time and we reinforced the ones that have been helpful for her and discussed a range of other strategies to cope with this ongoing neurological deficit.          Electronically Signed   _______________________ Arley Phenix, Psy.D. Clinical Neuropsychologist

## 2023-03-14 NOTE — Progress Notes (Signed)
Physical Therapy Session Note  Patient Details  Name: Jill Holt MRN: [id number] Date of Birth: 05-Aug-1970  Today's Date: 03/14/2023 PT Individual Time: 1106-1200 PT Individual Time Calculation (min): 54 min   Short Term Goals: Week 1:  PT Short Term Goal 1 (Week 1): pt will perform bed mobiltiy with min a PT Short Term Goal 2 (Week 1): Pt will ambulate 100 ft with assist PT Short Term Goal 3 (Week 1): Pt will navigate 3" stairs with assist  Skilled Therapeutic Interventions/Progress Updates: Pt presented in w/c with family present agreeable to therapy. Pt denies pain at rest. Pt requesting to go outside therefore session focused on functional mobility in a more community setting. Pt transported to [facility] entrance for energy conservation. At patio at [facility] entrance pt worked on transfers and ambulation on unlevel surfaces distances of 100-200 ft. Pt was CGA overall with intermittent bouts of close supervision. Pt noted to have good B foot clearance with use of AFOs. Pt also was able to complete Sit to stand from varying levels with CGA and increased effort from lower surfaces. Pt then ambulated through [facility] entrance to elevators and to nsg station without seated rest (x 1 leaning rest in elevator). At nsg station pt obtained personal food and ambulated to vending machine area to heat up food, all performed with supervision. Once completed pt ambulated to parallel bars and performed toe taps to 4in step for increased hip flexor recruitment 2 x 10 bilaterally. Pt then ambulated back to room and returned to w/c at end of session. Pt left in w/c with call bell within reach and needs met.      Therapy Documentation Precautions:  Precautions Precautions: Back Precaution Comments: Pt able to recall 3/3 precautions. Required Braces or Orthoses: Other Brace Other Brace: No brace ordered, LSO present in room and pt reports Dr. Wynetta Emery had her get one prior to surgery. Restrictions Weight Bearing  Restrictions: No General:   Vital Signs: Therapy Vitals Temp: 98.8 F (37.1 C) Pulse Rate: 95 Resp: 17 BP: 123/65 Patient Position (if appropriate): Lying Oxygen Therapy SpO2: 96 % O2 Device: Room Air   Therapy/Group: Individual Therapy  Lynton Crescenzo 03/14/2023, 2:46 PM

## 2023-03-14 NOTE — Progress Notes (Signed)
Occupational Therapy Session Note  Patient Details  Name: Manreet Sarr MRN: [id number] Date of Birth: 1970-09-10  Today's Date: 03/14/2023 OT Individual Time: 0930-1040 OT Individual Time Calculation (min): 70 min    Short Term Goals: Week 2:  OT Short Term Goal 1 (Week 2): STG=LTG 2/2 ELOS  Skilled Therapeutic Interventions/Progress Updates:    Pt resting in w/c upon arrival. Tot A for donning ted hose and AFOs/shoes. OT intervention with focus on functional amb with Rollator, standing balance, actvity tolerance, and safety awareness to increase independence with BADLs. Pt amb with Rollator to day room for standing activities. Wii balance board activities: soccer and tilt table. Max verbal cues for weight shifts with mod tactile cues during soccer task. Improved weight shifts noted during tilt table. Pt participated in Wii bowling, standing throughout game with no UE support. Pt amb with Rollator back to room and reamined setead in w/c. All needs within reach.   Therapy Documentation Precautions:  Precautions Precautions: Back Precaution Comments: Pt able to recall 3/3 precautions. Required Braces or Orthoses: Other Brace Other Brace: No brace ordered, LSO present in room and pt reports Dr. Wynetta Emery had her get one prior to surgery. Restrictions Weight Bearing Restrictions: No   Pain: Pt reports her pain is "ok" for now  Therapy/Group: Individual Therapy  Rich Brave 03/14/2023, 10:41 AM

## 2023-03-14 NOTE — Progress Notes (Signed)
Recreational Therapy Session Note  Patient Details  Name: Jill Holt MRN: [id number] Date of Birth: [birth year] Today's Date: 03/14/2023  Pain: no c/o  Pt participated in animal assisted activity seated w/c level with supervision.  Pt appreciative of this visit.   Otie Headlee 03/14/2023, 12:16 PM

## 2023-03-14 NOTE — Progress Notes (Signed)
Physical Therapy Session Note  Patient Details  Name: Jill Holt MRN: [id number] Date of Birth: 13-Mar-1971  Today's Date: 03/14/2023 PT Individual Time: 1300-1415 PT Individual Time Calculation (min): 75 min   Short Term Goals: Week 1:  PT Short Term Goal 1 (Week 1): pt will perform bed mobiltiy with min a PT Short Term Goal 2 (Week 1): Pt will ambulate 100 ft with assist PT Short Term Goal 3 (Week 1): Pt will navigate 3" stairs with assist  Skilled Therapeutic Interventions/Progress Updates:    pt received in w/c and agreeable to therapy. No complaint of pain. First part of session dedicated to AFO consult with Scarlette Slice (hangar) who assisted with assessment of gait with AFOs. Discussed shoes, need for ties when swelling goes down.   Pt participated in Sit to stand training with chair for UE support to reduce UE reliance, 4 x 6. Cues for anterior weight shift and using momentum for improved mechanics and decr UE reliance.   Pt participated in gentle forward stretch in sitting for hips and was instructed in nerve glides for sciatic discomfort to good affect. Recommended pt practice before and after therapy in bed.   Pt then ambulated >500 ft with 3 seated rest breaks using rollator to sit for endurance and functional mobility. Pt returned to room and to bed with mod a for LE management, was left with all needs in reach and alarm active.   Therapy Documentation Precautions:  Precautions Precautions: Back Precaution Comments: Pt able to recall 3/3 precautions. Required Braces or Orthoses: Other Brace Other Brace: No brace ordered, LSO present in room and pt reports Dr. Wynetta Emery had her get one prior to surgery. Restrictions Weight Bearing Restrictions: No General:       Therapy/Group: Individual Therapy  [name] 03/14/2023, 3:11 PM

## 2023-03-14 NOTE — Progress Notes (Signed)
PROGRESS NOTE   Subjective/Complaints:   Pt reports LBM yesterday- had good sized BM.  Pt also reports no increase in pain with reduction in gabapentin- but swelling better. Also notes CBG went down this AM to 56- didn't feel bad but Dexcom advised her.  Said at home she either didn't take any insulin until CBGs >200 OR max would take was 10 units of 70/30 if above 150-170- but not below this- made agreement to reduce 70-/30 at night to 15 units so can give as low as 120- but not below that.    ROS-    Pt denies SOB, abd pain, CP, N/V/C/D, and vision changes  Except for HPI  Objective:   No results found. No results for input(s): "WBC", "HGB", "HCT", "PLT" in the last 72 hours.  No results for input(s): "NA", "K", "CL", "CO2", "GLUCOSE", "BUN", "CREATININE", "CALCIUM" in the last 72 hours.   Intake/Output Summary (Last 24 hours) at 03/14/2023 0903 Last data filed at 03/14/2023 0811 Gross per 24 hour  Intake 240 ml  Output 400 ml  Net -160 ml        Physical Exam: Vital Signs Blood pressure (!) 150/89, pulse 96, temperature 98.6 F (37 C), resp. rate 18, height 5\' 4"  (1.626 m), weight 90.6 kg, last menstrual period 05/31/2022, SpO2 98%.        General: awake, alert, appropriate, sitting up in bed; eating breakfast;  NAD HENT: conjugate gaze; oropharynx moist CV: regular -almost tachycardic- and regular rhythm ; no JVD Pulmonary: CTA B/L; no W/R/R- good air movement GI: soft, NT, ND, (+)BS- more normoactive Psychiatric: appropriate- more interactive Neurological: Ox3  Extremities- trace LE edema to ankle B/L - much better Skin: No evidence of breakdown, no evidence of rash- however has honeycomb on back- looks good- no drainage around dressing Neurologic: Cranial nerves II through XII intact, motor strength is 5/5 in bilateral deltoid, bicep, tricep, grip, Pain limits MMT to 2- hip flexor, knee extensors,  ankle dorsiflexor and plantar flexor Sensory exam hypersensitive to LT L5-S1 , intact LT L2-S1  Musculoskeletal: Full range of motion in all 4 extremities. No joint swelling   Assessment/Plan: 1. Functional deficits which require 3+ hours per day of interdisciplinary therapy in a comprehensive inpatient rehab setting. Physiatrist is providing close team supervision and 24 hour management of active medical problems listed below. Physiatrist and rehab team continue to assess barriers to discharge/monitor patient progress toward functional and medical goals  Care Tool:  Bathing    Body parts bathed by patient: Right arm, Left arm, Chest, Abdomen, Front perineal area, Face, Right upper leg, Left upper leg   Body parts bathed by helper: Right lower leg, Left lower leg, Buttocks     Bathing assist Assist Level: Minimal Assistance - Patient > 75%     Upper Body Dressing/Undressing Upper body dressing   What is the patient wearing?: Pull over shirt, Bra    Upper body assist Assist Level: Set up assist    Lower Body Dressing/Undressing Lower body dressing      What is the patient wearing?: Underwear/pull up, Pants     Lower body assist Assist for lower body dressing: Set  up assist     Toileting Toileting    Toileting assist Assist for toileting: Supervision/Verbal cueing     Transfers Chair/bed transfer  Transfers assist     Chair/bed transfer assist level: Minimal Assistance - Patient > 75%     Locomotion Ambulation   Ambulation assist      Assist level: Minimal Assistance - Patient > 75% Assistive device: Walker-rolling Max distance: 132   Walk 10 feet activity   Assist     Assist level: Contact Guard/Touching assist Assistive device: Walker-rolling   Walk 50 feet activity   Assist Walk 50 feet with 2 turns activity did not occur: Safety/medical concerns         Walk 150 feet activity   Assist Walk 150 feet activity did not occur:  Safety/medical concerns         Walk 10 feet on uneven surface  activity   Assist Walk 10 feet on uneven surfaces activity did not occur: Safety/medical concerns         Wheelchair     Assist Is the patient using a wheelchair?: Yes Type of Wheelchair: Manual    Wheelchair assist level: Dependent - Patient 0% Max wheelchair distance: 150    Wheelchair 50 feet with 2 turns activity    Assist        Assist Level: Dependent - Patient 0%   Wheelchair 150 feet activity     Assist      Assist Level: Dependent - Patient 0%   Blood pressure (!) 150/89, pulse 96, temperature 98.6 F (37 C), resp. rate 18, height 5\' 4"  (1.626 m), weight 90.6 kg, last menstrual period 05/31/2022, SpO2 98%.  Medical Problem List and Plan: 1. 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  Status post redo decompressive laminectomies L3-4 and L4-5 with complete facetectomies and radical foraminotomies , L3-03/03/2023 LE weakness is in part due to pain, will optimize regimen              -patient may shower             -ELOS/Goals: 8-11 days with mod I goals PT, OT             -PRAFO's for both LE's at night             -will need AFO's for gait  D/c 11/21  Con't CIR PT and OT 2.  Antithrombotics: -DVT/anticoagulation:  Mechanical:  Antiembolism stockings, knee (TED hose) Bilateral lower extremities.  Check vascular study.  Discussed with neurosurgery on beginning Lovenox 11/11- Dopplers (-)               -antiplatelet therapy: N/A 3. Pain Management: currently on 600 mg twice daily, Flexeril and hydrocodone as needed             -pt was on gabapentin 900mg  tid at home--increase to 600mg  tid to start  11/13- pain getting better  11/14- will reduce gabapentin to 300 mg TID- and see if swelling improves- she feels with reduction in pain, can reduce gabapentin  11/15- pain didn't increase- with reduction in  gabapentin- swelling has improved 4. Mood/Behavior/Sleep: Provide emotional support- labile this am , "feels overwhelmed by medical issues "             -antipsychotic agents: N/A 5. Neuropsych/cognition: This patient is capable of making decisions on her own behalf. 6. Skin/Wound Care: Routine skin checks 7. Fluids/Electrolytes/Nutrition: Routine in and outs with follow-up  chemistries 8.  Hypertension.  Norvasc 10 mg daily, Cozaar 100 mg daily, hydrochlorothiazide 12.5 mg daily, Toprol-XL 25 mg daily.  Monitor with increased mobility  11/11- BP controlled after meds taken for AM- will monitor- is labile before/after meds  11/13- HR low 100s and BP running 140s-160s- increased Toprol XL tl 50 mg daily since HR and BP elevated  11/15- - Metoprolol increased yesterday- HR down to 96 this AM- will monitor and titrate as required Vitals:   03/13/23 1916 03/14/23 0500  BP: 111/64 (!) 150/89  Pulse: 94 96  Resp:    Temp: 98.9 F (37.2 C) 98.6 F (37 C)  SpO2: 97% 98%    9.  Diabetes mellitus.  Hemoglobin A1c 7.8.  NovoLog Mix 70/30 35 units with breakfast, 25 units with supper, Glucotrol XL 10 mg daily. CBG (last 3)  Recent Labs    03/13/23 2114 03/13/23 2144 03/14/23 0615  GLUCAP 66* 115* 86  Controlled 11/9  11/11- CBGs dropped to 44/34 last night due to not eating dinner and family not bringing in food like planned- will order to give 70/30 only if BG >120- and has food- changed both orders  11/12- Much better Cbgs- will con't to hold 70/30 if needed  11/13- pt had them hold her 70/30 and BC's spiked- will monitor and follow trend  11/14- Cbgs elevated x1- will monitor- usually when refuses 70/30  11/15- Dropped CBG to 66 last night- wil reduce night time insulin to 70/30 15 units from 25 units- she noted at home didn't take insulin unless CBGs >200- unless took ~ 10 units max.  10.  Hyperlipidemia.  Lipitor 11.  Constipation.  Colace twice daily, MiraLAX BID, have added Amitiza for  OIC, give dulc supp this pm if no BM   11/11- had BM yesterday feeling much better  11/12- LBM yesterday   11/13- said needed ot have BM- has not today- will give Sorbitol, etc tomorrow if no BM  11/14- Will give Sorbitol today and change Miralax to 2 senna daily.   11/15- LBM yesterday! 12. Mild hypoxia:             -wean oxygen as able             -check CBC in am 13. C/o dizziness while up with therapy. Sinus tachy on exam             -CBC as above, check BMET as well             -encourage fluids   11/12- Still mildly tachycardic- even at rest- might be anemia- last Hb 8.9 (getting better from prior 8.5,) but pt used to 12-13 Hb-    14.  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   LOS: 7 days A FACE TO FACE EVALUATION WAS PERFORMED  Josephyne Tarter 03/14/2023, 9:03 AM

## 2023-03-15 DIAGNOSIS — D62 Acute posthemorrhagic anemia: Secondary | ICD-10-CM

## 2023-03-15 DIAGNOSIS — K5901 Slow transit constipation: Secondary | ICD-10-CM

## 2023-03-15 LAB — GLUCOSE, CAPILLARY
Glucose-Capillary: 205 mg/dL — ABNORMAL HIGH (ref 70–99)
Glucose-Capillary: 95 mg/dL (ref 70–99)
Glucose-Capillary: 237 mg/dL — ABNORMAL HIGH (ref 70–99)
Glucose-Capillary: 136 mg/dL — ABNORMAL HIGH (ref 70–99)

## 2023-03-15 MED ORDER — INSULIN ASPART PROT & ASPART (70-30 MIX) 100 UNIT/ML ~~LOC~~ SUSP
20.0000 [IU] | Freq: Every day | SUBCUTANEOUS | Status: DC
  Administered 2023-03-15 – 2023-03-19 (×5): 20 [IU] via SUBCUTANEOUS

## 2023-03-15 NOTE — Progress Notes (Signed)
PROGRESS NOTE   Subjective/Complaints: Pt upset because she had an incontinent episode, wanted to take a shower early this morning and couldn't--got up and took one on her own. Nurse reports that she didn't call to ask to have bed cleaned, etc after incontinence. Pt also has stomach upset which she feels is due to lipitor.   ROS: Patient denies fever, rash, sore throat, blurred vision, dizziness,  vomiting, diarrhea, cough, shortness of breath or chest pain,   headache, or mood change.     Objective:   No results found. No results for input(s): "WBC", "HGB", "HCT", "PLT" in the last 72 hours.  No results for input(s): "NA", "K", "CL", "CO2", "GLUCOSE", "BUN", "CREATININE", "CALCIUM" in the last 72 hours.   Intake/Output Summary (Last 24 hours) at 03/15/2023 0857 Last data filed at 03/15/2023 0419 Gross per 24 hour  Intake --  Output 200 ml  Net -200 ml        Physical Exam: Vital Signs Blood pressure (!) 157/98, pulse (!) 103, temperature 98.1 F (36.7 C), temperature source Oral, resp. rate 18, height 5\' 4"  (1.626 m), weight 90.6 kg, last menstrual period 05/31/2022, SpO2 99%.  Constitutional: No distress . Vital signs reviewed. HEENT: NCAT, EOMI, oral membranes moist Neck: supple Cardiovascular: RRR without murmur. No JVD    Respiratory/Chest: CTA Bilaterally without wheezes or rales. Normal effort    GI/Abdomen: BS +, non-tender, non-distended Ext: no clubbing, cyanosis, or edema Psych: upset and irritable at first but improved as wel talked.   Neurological: Ox3  Extremities- trace LE edema to ankle B/L - much better Skin: Wound dressed, bandage clean/opsite--didn't remove Neurologic: Cranial nerves II through XII intact, motor strength is 5/5 in bilateral deltoid, bicep, tricep, grip, pain still a barrier to LE. 2-3/5  hip flexor, knee extensors, ankle dorsiflexor and plantar flexor Sensory exam hypersensitive  to LT L5-S1 , intact LT L2-S1  Musculoskeletal: Full range of motion in all 4 extremities. No joint swelling   Assessment/Plan: 1. Functional deficits which require 3+ hours per day of interdisciplinary therapy in a comprehensive inpatient rehab setting. Physiatrist is providing close team supervision and 24 hour management of active medical problems listed below. Physiatrist and rehab team continue to assess barriers to discharge/monitor patient progress toward functional and medical goals  Care Tool:  Bathing    Body parts bathed by patient: Right arm, Left arm, Chest, Abdomen, Front perineal area, Face, Right upper leg, Left upper leg   Body parts bathed by helper: Right lower leg, Left lower leg, Buttocks     Bathing assist Assist Level: Minimal Assistance - Patient > 75%     Upper Body Dressing/Undressing Upper body dressing   What is the patient wearing?: Pull over shirt, Bra    Upper body assist Assist Level: Set up assist    Lower Body Dressing/Undressing Lower body dressing      What is the patient wearing?: Underwear/pull up, Pants     Lower body assist Assist for lower body dressing: Set up assist     Toileting Toileting    Toileting assist Assist for toileting: Supervision/Verbal cueing     Transfers Chair/bed transfer  Transfers assist  Chair/bed transfer assist level: Minimal Assistance - Patient > 75%     Locomotion Ambulation   Ambulation assist      Assist level: Minimal Assistance - Patient > 75% Assistive device: Walker-rolling Max distance: 132   Walk 10 feet activity   Assist     Assist level: Contact Guard/Touching assist Assistive device: Walker-rolling   Walk 50 feet activity   Assist Walk 50 feet with 2 turns activity did not occur: Safety/medical concerns         Walk 150 feet activity   Assist Walk 150 feet activity did not occur: Safety/medical concerns         Walk 10 feet on uneven surface   activity   Assist Walk 10 feet on uneven surfaces activity did not occur: Safety/medical concerns         Wheelchair     Assist Is the patient using a wheelchair?: Yes Type of Wheelchair: Manual    Wheelchair assist level: Dependent - Patient 0% Max wheelchair distance: 150    Wheelchair 50 feet with 2 turns activity    Assist        Assist Level: Dependent - Patient 0%   Wheelchair 150 feet activity     Assist      Assist Level: Dependent - Patient 0%   Blood pressure (!) 157/98, pulse (!) 103, temperature 98.1 F (36.7 C), temperature source Oral, resp. rate 18, height 5\' 4"  (1.626 m), weight 90.6 kg, last menstrual period 05/31/2022, SpO2 99%.  Medical Problem List and Plan: 1. 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  Status post redo decompressive laminectomies L3-4 and L4-5 with complete facetectomies and radical foraminotomies , L3-03/03/2023 LE weakness is in part due to pain, will optimize regimen              -patient may shower             -ELOS/Goals: 8-11 days with mod I goals PT, OT             -PRAFO's for both LE's at night             -will need AFO's for gait  D/c 11/21  -Continue CIR therapies including PT, OT  2.  Antithrombotics: -DVT/anticoagulation:  Mechanical:  Antiembolism stockings, knee (TED hose) Bilateral lower extremities.  Check vascular study.  Discussed with neurosurgery on beginning Lovenox 11/11- Dopplers (-)               -antiplatelet therapy: N/A 3. Pain Management: currently on 600 mg twice daily, Flexeril and hydrocodone as needed             -pt was on gabapentin 900mg  tid at home--increase to 600mg  tid to start  11/13- pain getting better  11/14- will reduce gabapentin to 300 mg TID- and see if swelling improves- she feels with reduction in pain, can reduce gabapentin  11/15- pain didn't increase- with reduction in gabapentin-  swelling has improved  11/16 continue with current plan 4. Mood/Behavior/Sleep: Provide emotional support- labile this am , "feels overwhelmed by medical issues "             -antipsychotic agents: N/A 5. Neuropsych/cognition: This patient is capable of making decisions on her own behalf. 6. Skin/Wound Care: Routine skin checks 7. Fluids/Electrolytes/Nutrition: Routine in and outs with follow-up chemistries 8.  Hypertension.  Norvasc 10 mg daily, Cozaar 100 mg daily, hydrochlorothiazide 12.5 mg daily, Toprol-XL 25 mg  daily.  Monitor with increased mobility  11/11- BP controlled after meds taken for AM- will monitor- is labile before/after meds  11/13- HR low 100s and BP running 140s-160s- increased Toprol XL tl 50 mg daily since HR and BP elevated  11/15- - Metoprolol increased yesterday- HR down to 96 this AM- will monitor and titrate as required  11/16 BP/HR up this morning but upset about incontinence, etc--watch to see if it settles down today before making another change Vitals:   03/14/23 1931 03/15/23 0414  BP: 123/83 (!) 157/98  Pulse: (!) 105 (!) 103  Resp: 18 18  Temp: 98.6 F (37 C) 98.1 F (36.7 C)  SpO2: 98% 99%    9.  Diabetes mellitus.  Hemoglobin A1c 7.8.  NovoLog Mix 70/30 35 units with breakfast, 25 units with supper, Glucotrol XL 10 mg daily. CBG (last 3)  Recent Labs    03/14/23 1639 03/14/23 2130 03/15/23 0620  GLUCAP 262* 152* 205*    11/11- CBGs dropped to 44/34 last night due to not eating dinner and family not bringing in food like planned- will order to give 70/30 only if BG >120- and has food- changed both orders  11/12- Much better Cbgs- will con't to hold 70/30 if needed  11/13- pt had them hold her 70/30 and BC's spiked- will monitor and follow trend  11/14- Cbgs elevated x1- will monitor- usually when refuses 70/30  11/15- Dropped CBG to 66 last night- wil reduce night time insulin to 70/30 15 units from 25 units- she noted at home didn't take insulin  unless CBGs >200- unless took ~ 10 units max.   11/16 still with inconsistent control--will increase pm 70/30 to 20 units to see if we can bridge gap as am sugar 205 today 10.  Hyperlipidemia.  Lipitor 11.  Constipation.  Colace twice daily, MiraLAX BID, have added Amitiza for OIC, give dulc supp this pm if no BM   11/11- had BM yesterday feeling much better  11/12- LBM yesterday   11/13- said needed ot have BM- has not today- will give Sorbitol, etc tomorrow if no BM  11/14- Will give Sorbitol today and change Miralax to 2 senna daily.   11/14- LBM   12. Mild hypoxia:             -wean oxygen as able             -check CBC in am 13. C/o dizziness while up with therapy. Sinus tachy on exam             -CBC as above, check BMET as well             -encourage fluids   11/12- Still mildly tachycardic- even at rest- might be anemia- last Hb 8.9 (getting better from prior 8.5,) but pt used to 12-13 Hb-    11/16 consider adjusting metoprolol--recheck cbc monday 14.  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 Nausea:  -pt feels it's due to lipitor which was started new in hospital.    -will stop it for now     LOS: 8 days A FACE TO FACE EVALUATION WAS PERFORMED  [name] 03/15/2023, 8:57 AM

## 2023-03-15 NOTE — Progress Notes (Signed)
Physical Therapy Session Note  Patient Details  Name: Jill Holt MRN: [id number] Date of Birth: 08-29-1970  Today's Date: 03/15/2023 PT Individual Time: 0910-1007 PT Individual Time Calculation (min): 57 min   Short Term Goals: Week 1:  PT Short Term Goal 1 (Week 1): pt will perform bed mobiltiy with min a PT Short Term Goal 2 (Week 1): Pt will ambulate 100 ft with assist PT Short Term Goal 3 (Week 1): Pt will navigate 3" stairs with assist  Skilled Therapeutic Interventions/Progress Updates: Patient L sidelying in bed on entrance to room. Patient alert and agreeable to PT session.   Patient reported 7/10 pain in mid to low back on surgical site (premedicated). Rest breaks provided throughout. Pt reported 2/10 pain at end of session when ambulating back to room.   Therapeutic Activity: Bed Mobility: Pt performed supine<>sit on EOB with supervision and use of HOB railing.  Transfers: Pt performed sit<>stand transfers throughout session with supervision for safety. Provided VC for lock engagement of rollator (PTA provided CIR rollator due to pt's rollator braking system being poor).  PTA donned B AFO, TED hose and personal shoes dependently. Pt ambulated from room<>main gym with supervision and no LOB in rollator).   Therapeutic Exercise: Pt performed the following exercises with therapist providing the described cuing and facilitation for improvement. - x 5 step up with L LE and B UE support on hand railing (6" step) with CGA. Pt attempted to step up with R LE but was unable to clear due to decrease in LE hip flexor strength (moved to short sitting therex to prime B LE  musculature) - LAQ with 4lb ankle weight donned bilaterally x 10 with VC to control eccentric (unable to go to full available ROM). Pt then progressed to having ankle weights till donned, and PTA holding yellow theraband around ankles pulling LE into knee extension in order to challenge concentric HS contraction, and  eccentric control of HS as pt moves into knee extension (x 10 bilaterally with decrease in resistance on L LE).  - R hip extension in short sitting with yellow theraband pulling into hip flexion. Pt performed x 8 reps with VC to push LE as close to floor as possible and to control eccentric - Hip flexion on R LE with PTA passively moving into less than 90* due to precautions with VC for pt to hold position (x 8 reps) - Pt back at 6" stair with instructions to only step with R LE. Pt with good carryover and able to step to with compensation via L lean and some circumduction. Pt performed x 3. Then pt cued to bring R LE into hip flexion, then hip extension, then use lengthened hip flexors and and momentum to achieve step with less circumduction. Pt performed x 8 with last 3 reps having PTA leg close to R lateral leg with cues for pt to avoid hitting leg to decrease compensation.   Patient R sidelying in bed at end of session with brakes locked, bed alarm set, and all needs within reach.      Therapy Documentation Precautions:  Precautions Precautions: Back Precaution Comments: Pt able to recall 3/3 precautions. Required Braces or Orthoses: Other Brace Other Brace: No brace ordered, LSO present in room and pt reports Dr. Wynetta Emery had her get one prior to surgery. Restrictions Weight Bearing Restrictions: No   Therapy/Group: Individual Therapy  Clessie Karras PTA 03/15/2023, 12:45 PM

## 2023-03-15 NOTE — Progress Notes (Signed)
Patient wanted to take a shower this morning since she used a female urinal and spilled. Patient waited a while to call for assistance. Patient didn't have order for shower but insisted on one. MD made aware. Dressing came off on patient's incision to lower back area. Rn replaced it with transparent with 4x4 dressing.

## 2023-03-15 NOTE — Progress Notes (Signed)
Occupational Therapy Session Note  Patient Details  Name: Jill Holt MRN: [id number] Date of Birth: 1970/07/30  Today's Date: 03/15/2023 OT Individual Time: 1316-1415 OT Individual Time Calculation (min): 59 min  16 mins missed; pt eating lunch    Short Term Goals: Week 2:  OT Short Term Goal 1 (Week 2): STG=LTG 2/2 ELOS  Skilled Therapeutic Interventions/Progress Updates:  Pt greeted supine in bed, pt eating lunch upon arrival, returned 16 mins later with pt agreeable to OT intervention.      Transfers/bed mobility: pt completed supine>sit MODI. Pt completed ambualtory transfer to bathroom with rollator with supervision.    Therapeutic activity:  Sit>stand task with pt stepping up onto airex pad, reaching with RUE to place bean bags in hoop to simulate reaching while maintaining back precautions. Pt needed MIN- MOD support to step up on pad with L HHA.   Pt completed functional mobility obstacle course with pt having to ambulate through cones to retrieve cards from one side of the gym to the other. Pt completed task with supervision, emphasis on maintaining back precautions during functional reaching.    ADLs:  Grooming: pt stood at sink for hand hygiene with supervision.   Transfers: pt able to ambulate around gym with rollator with supervision using reacher to retrieve wash cloths from floor, emphasis on navigating RW in between tight spaces to simulate mobility in home environment.good adherence to back precautions during task.  Toileting: pt with continent urine void, completing 3/3 toileting task with supervision.   IADLS: pt able to stand on airex cushion with no UE support to fold wash cloths with CGA to simulate IADL tasks at home.                  Ended session with pt seated in recliner with all needs within reach.                  Therapy Documentation Precautions:  Precautions Precautions: Back Precaution Comments: Pt able to recall 3/3  precautions. Required Braces or Orthoses: Other Brace Other Brace: No brace ordered, LSO present in room and pt reports Dr. Wynetta Emery had her get one prior to surgery. Restrictions Weight Bearing Restrictions: No General: General OT Amount of Missed Time: 16 Minutes  Pain: no pain reported during session     Therapy/Group: Individual Therapy  Barron Schmid 03/15/2023, 3:37 PM

## 2023-03-16 LAB — GLUCOSE, CAPILLARY
Glucose-Capillary: 122 mg/dL — ABNORMAL HIGH (ref 70–99)
Glucose-Capillary: 202 mg/dL — ABNORMAL HIGH (ref 70–99)
Glucose-Capillary: 166 mg/dL — ABNORMAL HIGH (ref 70–99)
Glucose-Capillary: 120 mg/dL — ABNORMAL HIGH (ref 70–99)

## 2023-03-16 MED ORDER — METOPROLOL SUCCINATE ER 50 MG PO TB24: 75.0000 mg | ORAL_TABLET | Freq: Every day | ORAL | Status: DC

## 2023-03-16 MED ORDER — METOPROLOL SUCCINATE ER 50 MG PO TB24
75.0000 mg | ORAL_TABLET | Freq: Every day | ORAL | Status: DC
  Administered 2023-03-16 – 2023-03-20 (×5): 75 mg via ORAL
  Filled 2023-03-16 (×4): qty 1

## 2023-03-16 MED ORDER — METOPROLOL SUCCINATE ER 25 MG PO TB24: 25.0000 mg | ORAL_TABLET | Freq: Once | ORAL | Status: DC

## 2023-03-16 NOTE — Progress Notes (Signed)
PROGRESS NOTE   Subjective/Complaints: Pt in much better spirits today. Had a good night sleep. Stomach feels better. Talking to her grandson on face time when I came in.   ROS: Patient denies fever, rash, sore throat, blurred vision, dizziness, nausea, vomiting, diarrhea, cough, shortness of breath or chest pain, joint or back/neck pain, headache, or mood change.   Objective:   No results found. No results for input(s): "WBC", "HGB", "HCT", "PLT" in the last 72 hours.  No results for input(s): "NA", "K", "CL", "CO2", "GLUCOSE", "BUN", "CREATININE", "CALCIUM" in the last 72 hours.   Intake/Output Summary (Last 24 hours) at 03/16/2023 0830 Last data filed at 03/15/2023 1833 Gross per 24 hour  Intake 477 ml  Output --  Net 477 ml        Physical Exam: Vital Signs Blood pressure (!) 163/93, pulse 100, temperature 98.5 F (36.9 C), temperature source Oral, resp. rate 18, height 5\' 4"  (1.626 m), weight 90.6 kg, last menstrual period 05/31/2022, SpO2 98%.  Constitutional: No distress . Vital signs reviewed. HEENT: NCAT, EOMI, oral membranes moist Neck: supple Cardiovascular: RRR without murmur. No JVD    Respiratory/Chest: CTA Bilaterally without wheezes or rales. Normal effort    GI/Abdomen: BS +, non-tender, non-distended Ext: no clubbing, cyanosis, or edema Psych: pleasant and cooperative  Neurological: Ox3  Extremities- trace LE edema to ankle B/L improved Skin: Wound dressed, bandage clean/opsite--didn't remove Neurologic: Cranial nerves II through XII intact, motor strength is 5/5 in bilateral deltoid, bicep, tricep, grip, pain still a barrier to LE. 2-3/5  hip flexor, knee extensors, ankle dorsiflexor and plantar flexor--stable 11/17 Sensory exam hypersensitive to LT L5-S1 , intact LT L2-S1  Musculoskeletal: Full range of motion in all 4 extremities. No joint swelling   Assessment/Plan: 1. Functional deficits  which require 3+ hours per day of interdisciplinary therapy in a comprehensive inpatient rehab setting. Physiatrist is providing close team supervision and 24 hour management of active medical problems listed below. Physiatrist and rehab team continue to assess barriers to discharge/monitor patient progress toward functional and medical goals  Care Tool:  Bathing    Body parts bathed by patient: Right arm, Left arm, Chest, Abdomen, Front perineal area, Face, Right upper leg, Left upper leg   Body parts bathed by helper: Right lower leg, Left lower leg, Buttocks     Bathing assist Assist Level: Minimal Assistance - Patient > 75%     Upper Body Dressing/Undressing Upper body dressing   What is the patient wearing?: Pull over shirt, Bra    Upper body assist Assist Level: Set up assist    Lower Body Dressing/Undressing Lower body dressing      What is the patient wearing?: Underwear/pull up, Pants     Lower body assist Assist for lower body dressing: Set up assist     Toileting Toileting    Toileting assist Assist for toileting: Supervision/Verbal cueing     Transfers Chair/bed transfer  Transfers assist     Chair/bed transfer assist level: Supervision/Verbal cueing     Locomotion Ambulation   Ambulation assist      Assist level: Minimal Assistance - Patient > 75% Assistive device: Walker-rolling Max distance:  132   Walk 10 feet activity   Assist     Assist level: Contact Guard/Touching assist Assistive device: Walker-rolling   Walk 50 feet activity   Assist Walk 50 feet with 2 turns activity did not occur: Safety/medical concerns         Walk 150 feet activity   Assist Walk 150 feet activity did not occur: Safety/medical concerns         Walk 10 feet on uneven surface  activity   Assist Walk 10 feet on uneven surfaces activity did not occur: Safety/medical concerns         Wheelchair     Assist Is the patient using a  wheelchair?: Yes Type of Wheelchair: Manual    Wheelchair assist level: Dependent - Patient 0% Max wheelchair distance: 150    Wheelchair 50 feet with 2 turns activity    Assist        Assist Level: Dependent - Patient 0%   Wheelchair 150 feet activity     Assist      Assist Level: Dependent - Patient 0%   Blood pressure (!) 163/93, pulse 100, temperature 98.5 F (36.9 C), temperature source Oral, resp. rate 18, height 5\' 4"  (1.626 m), weight 90.6 kg, last menstrual period 05/31/2022, SpO2 98%.  Medical Problem List and Plan: 1. 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  Status post redo decompressive laminectomies L3-4 and L4-5 with complete facetectomies and radical foraminotomies , L3-03/03/2023 LE weakness is in part due to pain, will optimize regimen              -patient may shower             -ELOS/Goals: 8-11 days with mod I goals PT, OT             -PRAFO's for both LE's at night             -will need AFO's for gait  D/c 11/21  -Continue CIR therapies including PT, OT   2.  Antithrombotics: -DVT/anticoagulation:  Mechanical:  Antiembolism stockings, knee (TED hose) Bilateral lower extremities.  Check vascular study.  Discussed with neurosurgery on beginning Lovenox 11/11- Dopplers (-)               -antiplatelet therapy: N/A 3. Pain Management: currently on 600 mg twice daily, Flexeril and hydrocodone as needed             -pt was on gabapentin 900mg  tid at home--increase to 600mg  tid to start  11/13- pain getting better  11/14- will reduce gabapentin to 300 mg TID- and see if swelling improves- she feels with reduction in pain, can reduce gabapentin  11/15- pain didn't increase- with reduction in gabapentin- swelling has improved  11/17 appears controlled.  continue with current plan 4. Mood/Behavior/Sleep: Provide emotional support- labile this am , "feels overwhelmed by medical  issues "             -antipsychotic agents: N/A 5. Neuropsych/cognition: This patient is capable of making decisions on her own behalf. 6. Skin/Wound Care: Routine skin checks 7. Fluids/Electrolytes/Nutrition: Routine in and outs with follow-up chemistries 8.  Hypertension.  Norvasc 10 mg daily, Cozaar 100 mg daily, hydrochlorothiazide 12.5 mg daily, Toprol-XL 25 mg daily.  Monitor with increased mobility  11/11- BP controlled after meds taken for AM- will monitor- is labile before/after meds  11/13- HR low 100s and BP running 140s-160s- increased Toprol XL tl 50  mg daily since HR and BP elevated  11/15- - Metoprolol increased yesterday- HR down to 96 this AM- will monitor and titrate as required  11/17 BP/HR remained elevated despite feeling better today   -will increase metoprolol to 75mg  daily (XL) Vitals:   03/15/23 2027 03/16/23 0507  BP: 124/76 (!) 163/93  Pulse: 100 100  Resp: 17 18  Temp: 98.5 F (36.9 C) 98.5 F (36.9 C)  SpO2: 99% 98%    9.  Diabetes mellitus.  Hemoglobin A1c 7.8.  NovoLog Mix 70/30 35 units with breakfast, 25 units with supper, Glucotrol XL 10 mg daily. CBG (last 3)  Recent Labs    03/15/23 1622 03/15/23 2106 03/16/23 0545  GLUCAP 237* 136* 122*    11/11- CBGs dropped to 44/34 last night due to not eating dinner and family not bringing in food like planned- will order to give 70/30 only if BG >120- and has food- changed both orders  11/12- Much better Cbgs- will con't to hold 70/30 if needed  11/13- pt had them hold her 70/30 and BC's spiked- will monitor and follow trend  11/14- Cbgs elevated x1- will monitor- usually when refuses 70/30  11/15- Dropped CBG to 66 last night- wil reduce night time insulin to 70/30 15 units from 25 units- she noted at home didn't take insulin unless CBGs >200- unless took ~ 10 units max.   11/17 increased pm 70/30 to 20 units for better AM control. Sugars better this morning. Observe today 10.  Hyperlipidemia.   Lipitor 11.  Constipation.  Colace twice daily, MiraLAX BID, have added Amitiza for OIC, give dulc supp this pm if no BM   11/11- had BM yesterday feeling much better  11/12- LBM yesterday   11/13- said needed ot have BM- has not today- will give Sorbitol, etc tomorrow if no BM  11/14- Will give Sorbitol today and change Miralax to 2 senna daily.   11/16- LBM   12. Mild hypoxia:             -wean oxygen as able             -check CBC in am 13. C/o dizziness while up with therapy. Sinus tachy on exam             -CBC as above, check BMET as well             -encourage fluids   11/12- Still mildly tachycardic- even at rest- might be anemia- last Hb 8.9 (getting better from prior 8.5,) but pt used to 12-13 Hb-    11/16 consider adjusting metoprolol--recheck cbc monday 14.  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 Nausea:  -pt feels it's due to lipitor which was started new in hospital.   -11/17 stopped it yesterday and she is feeling better. Can discuss with primary as outpt other options.     LOS: 9 days A FACE TO FACE EVALUATION WAS PERFORMED  [name] 03/16/2023, 8:30 AM

## 2023-03-17 LAB — GLUCOSE, CAPILLARY
Glucose-Capillary: 97 mg/dL (ref 70–99)
Glucose-Capillary: 183 mg/dL — ABNORMAL HIGH (ref 70–99)
Glucose-Capillary: 285 mg/dL — ABNORMAL HIGH (ref 70–99)
Glucose-Capillary: 153 mg/dL — ABNORMAL HIGH (ref 70–99)

## 2023-03-17 MED ORDER — DOCUSATE SODIUM 100 MG PO CAPS: 100.0000 mg | ORAL_CAPSULE | Freq: Two times a day (BID) | ORAL | Status: AC

## 2023-03-17 MED ORDER — ACETAMINOPHEN 325 MG PO TABS: 650.0000 mg | ORAL_TABLET | ORAL | Status: AC | PRN

## 2023-03-17 NOTE — Plan of Care (Signed)
  Problem: Consults Goal: RH SPINAL CORD INJURY PATIENT EDUCATION Description:  See Patient Education module for education specifics.  Outcome: Progressing   Problem: SCI BOWEL ELIMINATION Goal: RH STG MANAGE BOWEL WITH ASSISTANCE Description: STG Manage Bowel with min Assistance. Outcome: Progressing Goal: RH STG SCI MANAGE BOWEL WITH MEDICATION WITH ASSISTANCE Description: STG SCI Manage bowel with medication with min assistance. Outcome: Progressing Goal: RH STG SCI MANAGE BOWEL PROGRAM W/ASSIST OR AS APPROPRIATE Description: STG SCI Manage bowel program w/min assist or as appropriate. Outcome: Progressing   Problem: SCI BLADDER ELIMINATION Goal: RH STG MANAGE BLADDER WITH ASSISTANCE Description: STG Manage Bladder With min Assistance Outcome: Progressing Goal: RH STG MANAGE BLADDER WITH MEDICATION WITH ASSISTANCE Description: STG Manage Bladder With Medication With min Assistance. Outcome: Progressing   Problem: RH SKIN INTEGRITY Goal: RH STG SKIN FREE OF INFECTION/BREAKDOWN Description: Incision will remain intact and be free of infection/breakdown with min assist  Outcome: Progressing Goal: RH STG MAINTAIN SKIN INTEGRITY WITH ASSISTANCE Description: STG Maintain Skin Integrity With min Assistance. Outcome: Progressing Goal: RH STG ABLE TO PERFORM INCISION/WOUND CARE W/ASSISTANCE Description: STG Able To Perform Incision/Wound Care With min Assistance. Outcome: Progressing   Problem: RH SAFETY Goal: RH STG ADHERE TO SAFETY PRECAUTIONS W/ASSISTANCE/DEVICE Description: STG Adhere to Safety Precautions With min Assistance/Device. Outcome: Progressing Goal: RH STG DECREASED RISK OF FALL WITH ASSISTANCE Description: STG Decreased Risk of Fall With cueing Assistance. Outcome: Progressing   Problem: RH PAIN MANAGEMENT Goal: RH STG PAIN MANAGED AT OR BELOW PT'S PAIN GOAL Description: Less than 4 with PRN medications min assist  Outcome: Progressing   Problem: RH KNOWLEDGE  DEFICIT SCI Goal: RH STG INCREASE KNOWLEDGE OF SELF CARE AFTER SCI Description: Patient will be able to manage medications, bowels, and diet/ lifestyle modifications to improve A1C of 7.8 from nursing education and nursing handouts independently  Outcome: Progressing

## 2023-03-17 NOTE — Progress Notes (Signed)
PROGRESS NOTE   Subjective/Complaints:  Pt feeling good- had "great weekend".  Ingrown toenails hurting- - also had peed on self this weekend- upset about this.  LBM yesterday   ROS:  Pt denies SOB, abd pain, CP, N/V/C/D, and vision changes  Objective:   No results found. No results for input(s): "WBC", "HGB", "HCT", "PLT" in the last 72 hours.  No results for input(s): "NA", "K", "CL", "CO2", "GLUCOSE", "BUN", "CREATININE", "CALCIUM" in the last 72 hours.   Intake/Output Summary (Last 24 hours) at 03/17/2023 1145 Last data filed at 03/17/2023 0800 Gross per 24 hour  Intake 777 ml  Output --  Net 777 ml        Physical Exam: Vital Signs Blood pressure (!) 157/89, pulse (!) 102, temperature 98.6 F (37 C), temperature source Oral, resp. rate 18, height 5\' 4"  (1.626 m), weight 90.6 kg, last menstrual period 05/31/2022, SpO2 99%.    General: awake, alert, appropriate, sitting up in bed; finished >50% of tray; NAD HENT: conjugate gaze; oropharynx moist CV: regular rhythm, mildly tachycardic rate; no JVD Pulmonary: CTA B/L; no W/R/R- good air movement GI: soft, NT, ND, (+)BS Psychiatric: appropriate- interactive  Neurological: Ox3  Extremities- trace LE edema to ankle B/L improved Skin: Wound dressed, bandage clean/opsite--didn't remove Neurologic: Cranial nerves II through XII intact, motor strength is 5/5 in bilateral deltoid, bicep, tricep, grip, pain still a barrier to LE. 2-3/5  hip flexor, knee extensors, ankle dorsiflexor and plantar flexor--stable 11/17 Sensory exam hypersensitive to LT L5-S1 , intact LT L2-S1  Musculoskeletal: Full range of motion in all 4 extremities. No joint swelling   Assessment/Plan: 1. Functional deficits which require 3+ hours per day of interdisciplinary therapy in a comprehensive inpatient rehab setting. Physiatrist is providing close team supervision and 24 hour management  of active medical problems listed below. Physiatrist and rehab team continue to assess barriers to discharge/monitor patient progress toward functional and medical goals  Care Tool:  Bathing    Body parts bathed by patient: Right arm, Left arm, Chest, Abdomen, Front perineal area, Face, Right upper leg, Left upper leg   Body parts bathed by helper: Right lower leg, Left lower leg, Buttocks     Bathing assist Assist Level: Minimal Assistance - Patient > 75%     Upper Body Dressing/Undressing Upper body dressing   What is the patient wearing?: Pull over shirt, Bra    Upper body assist Assist Level: Set up assist    Lower Body Dressing/Undressing Lower body dressing      What is the patient wearing?: Underwear/pull up, Pants     Lower body assist Assist for lower body dressing: Set up assist     Toileting Toileting    Toileting assist Assist for toileting: Supervision/Verbal cueing     Transfers Chair/bed transfer  Transfers assist     Chair/bed transfer assist level: Supervision/Verbal cueing     Locomotion Ambulation   Ambulation assist      Assist level: Supervision/Verbal cueing Assistive device: Rollator Max distance: 100+   Walk 10 feet activity   Assist     Assist level: Contact Guard/Touching assist Assistive device: Walker-rolling   Walk 50 feet  activity   Assist Walk 50 feet with 2 turns activity did not occur: Safety/medical concerns         Walk 150 feet activity   Assist Walk 150 feet activity did not occur: Safety/medical concerns         Walk 10 feet on uneven surface  activity   Assist Walk 10 feet on uneven surfaces activity did not occur: Safety/medical concerns         Wheelchair     Assist Is the patient using a wheelchair?: Yes Type of Wheelchair: Manual    Wheelchair assist level: Dependent - Patient 0% Max wheelchair distance: 150    Wheelchair 50 feet with 2 turns  activity    Assist        Assist Level: Dependent - Patient 0%   Wheelchair 150 feet activity     Assist      Assist Level: Dependent - Patient 0%   Blood pressure (!) 157/89, pulse (!) 102, temperature 98.6 F (37 C), temperature source Oral, resp. rate 18, height 5\' 4"  (1.626 m), weight 90.6 kg, last menstrual period 05/31/2022, SpO2 99%.  Medical Problem List and Plan: 1. 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  Status post redo decompressive laminectomies L3-4 and L4-5 with complete facetectomies and radical foraminotomies , L3-03/03/2023 LE weakness is in part due to pain, will optimize regimen              -patient may shower             -ELOS/Goals: 8-11 days with mod I goals PT, OT             -PRAFO's for both LE's at night             -will need AFO's for gait  D/c 11/21  Con't CIR PT and OT 2.  Antithrombotics: -DVT/anticoagulation:  Mechanical:  Antiembolism stockings, knee (TED hose) Bilateral lower extremities.  Check vascular study.  Discussed with neurosurgery on beginning Lovenox 11/11- Dopplers (-)               -antiplatelet therapy: N/A 3. Pain Management: currently on 600 mg twice daily, Flexeril and hydrocodone as needed             -pt was on gabapentin 900mg  tid at home--increase to 600mg  tid to start  11/13- pain getting better  11/14- will reduce gabapentin to 300 mg TID- and see if swelling improves- she feels with reduction in pain, can reduce gabapentin  11/15- pain didn't increase- with reduction in gabapentin- swelling has improved  11/17 appears controlled.  continue with current plan  11/18- pain well controlled- con't low dose gabapentin  4. Mood/Behavior/Sleep: Provide emotional support- labile this am , "feels overwhelmed by medical issues "             -antipsychotic agents: N/A 5. Neuropsych/cognition: This patient is capable of making decisions on her own  behalf. 6. Skin/Wound Care: Routine skin checks 7. Fluids/Electrolytes/Nutrition: Routine in and outs with follow-up chemistries 8.  Hypertension.  Norvasc 10 mg daily, Cozaar 100 mg daily, hydrochlorothiazide 12.5 mg daily, Toprol-XL 25 mg daily.  Monitor with increased mobility  11/11- BP controlled after meds taken for AM- will monitor- is labile before/after meds  11/13- HR low 100s and BP running 140s-160s- increased Toprol XL tl 50 mg daily since HR and BP elevated  11/15- - Metoprolol increased yesterday- HR down to 96 this AM-  will monitor and titrate as required  11/17 BP/HR remained elevated despite feeling better today   -will increase metoprolol to 75mg  daily (XL)  11/18- just got increase in Metoprolol- so will wait to make changes Vitals:   03/17/23 0446 03/17/23 0736  BP: (!) 150/87 (!) 157/89  Pulse: 100 (!) 102  Resp: 18 18  Temp: 98.8 F (37.1 C) 98.6 F (37 C)  SpO2: 98% 99%    9.  Diabetes mellitus.  Hemoglobin A1c 7.8.  NovoLog Mix 70/30 35 units with breakfast, 25 units with supper, Glucotrol XL 10 mg daily. CBG (last 3)  Recent Labs    03/16/23 1611 03/16/23 2042 03/17/23 0529  GLUCAP 166* 120* 97    11/11- CBGs dropped to 44/34 last night due to not eating dinner and family not bringing in food like planned- will order to give 70/30 only if BG >120- and has food- changed both orders  11/12- Much better Cbgs- will con't to hold 70/30 if needed  11/13- pt had them hold her 70/30 and BC's spiked- will monitor and follow trend  11/14- Cbgs elevated x1- will monitor- usually when refuses 70/30  11/15- Dropped CBG to 66 last night- wil reduce night time insulin to 70/30 15 units from 25 units- she noted at home didn't take insulin unless CBGs >200- unless took ~ 10 units max.   11/17 increased pm 70/30 to 20 units for better AM control. Sugars better this morning. Observe today  11/18- doing better- con'tr egimen 10.  Hyperlipidemia.  Lipitor 11.  Constipation.   Colace twice daily, MiraLAX BID, have added Amitiza for OIC, give dulc supp this pm if no BM   11/11- had BM yesterday feeling much better  11/12- LBM yesterday   11/13- said needed ot have BM- has not today- will give Sorbitol, etc tomorrow if no BM  11/14- Will give Sorbitol today and change Miralax to 2 senna daily.   11/18- LBM yesterday  12. Mild hypoxia:             -wean oxygen as able             -check CBC in am 13. C/o dizziness while up with therapy. Sinus tachy on exam             -CBC as above, check BMET as well             -encourage fluids   11/12- Still mildly tachycardic- even at rest- might be anemia- last Hb 8.9 (getting better from prior 8.5,) but pt used to 12-13 Hb-    11/16 consider adjusting metoprolol--recheck cbc monday 14.  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 Nausea:  -pt feels it's due to lipitor which was started new in hospital.   -11/17 stopped it yesterday and she is feeling better. Can discuss with primary as outpt other options.    11/18- feeling better   I spent a total of 36   minutes on total care today- >50% coordination of care- due to  Reviewed vitals and BP- specifically- cannot increase Metoprolol yet- also prolonged d/w pt about some concerns.   LOS: 10 days A FACE TO FACE EVALUATION WAS PERFORMED  Jill Holt 03/17/2023, 11:45 AM

## 2023-03-17 NOTE — Progress Notes (Signed)
Physical Therapy Weekly Progress Note  Patient Details  Name: Hiya Boers MRN: [id number] Date of Birth: 1971/04/21  Beginning of progress report period: March 08, 2023 End of progress report period: March 17, 2023  Today's Date: 03/17/2023 PT Individual Time: 4098-1191 PT Individual Time Calculation (min): 70 min   Patient has met 3 of 3 short term goals.  Pt is progressing well and on track for discharge this week. She is using rollator with CGA-supervision with BIL AFOs. Stair navigation with BHR or LHR and quad cane with CGA, min a required at times for stepping up with RLE when fatigued. Pt will benefit from continued LE strengthening, gait training, and endurance training for improved safety and decreased fall risk at home.   Patient continues to demonstrate the following deficits muscle weakness, unbalanced muscle activation and decreased coordination, and decreased standing balance, decreased postural control, and difficulty maintaining precautions and therefore will continue to benefit from skilled PT intervention to increase functional independence with mobility.  Patient progressing toward long term goals..  Continue plan of care.  PT Short Term Goals Week 1:  PT Short Term Goal 1 (Week 1): pt will perform bed mobiltiy with min a PT Short Term Goal 1 - Progress (Week 1): Met PT Short Term Goal 2 (Week 1): Pt will ambulate 100 ft with assist PT Short Term Goal 2 - Progress (Week 1): Met PT Short Term Goal 3 (Week 1): Pt will navigate 3" stairs with assist PT Short Term Goal 3 - Progress (Week 1): Met Week 2:  PT Short Term Goal 1 (Week 2): =LTGs d/t ELOS  Skilled Therapeutic Interventions/Progress Updates:    Pt seated in w/c on arrival and agreeable to therapy. Reports 5/10 pain in back, rest and positioning as needed, declined pain medication at this time.   Sit to stand with CGA-supervison to rollator and min a with quad cane this session.  Gait throughout session  with CGA-supervision with rollator and min a-CGA with quad cane trials. Discussed pt likely to progress to quad cane at home in near future, but benefits from seat on rollator for community ambulation.   Stair training with BHR and quad cane. Attempted with single handrail but pt was unsuccessful, trials with quad cane with light CGA. Stair navigation, x several bouts of 4 and 3 bouts of step ups on LLE. Recommend pt personally acquire quad cane for stair navigation at home.   Pt performed nustep at level 5 x 5 min at 30-35 spm and x 5 min at 40-45 spm for global strength and endurance.   Pt returned to w/c, and was left with all needs in reach and alarm active.   Therapy Documentation Precautions:  Precautions Precautions: Back Precaution Comments: Pt able to recall 3/3 precautions. Required Braces or Orthoses: Other Brace Other Brace: No brace ordered, LSO present in room and pt reports Dr. Wynetta Emery had her get one prior to surgery. Restrictions Weight Bearing Restrictions: No General:      Therapy/Group: Individual Therapy  [name] 03/17/2023, 11:06 AM

## 2023-03-17 NOTE — Progress Notes (Signed)
Physical Therapy Session Note  Patient Details  Name: Jill Holt MRN: [id number] Date of Birth: [birth year]  Today's Date: 03/17/2023 PT Individual Time: 1454-1535 PT Individual Time Calculation (min): 41 min   Short Term Goals: Week 1:  PT Short Term Goal 1 (Week 1): pt will perform bed mobiltiy with min a PT Short Term Goal 1 - Progress (Week 1): Met PT Short Term Goal 2 (Week 1): Pt will ambulate 100 ft with assist PT Short Term Goal 2 - Progress (Week 1): Met PT Short Term Goal 3 (Week 1): Pt will navigate 3" stairs with assist PT Short Term Goal 3 - Progress (Week 1): Met Week 2:  PT Short Term Goal 1 (Week 2): =LTGs d/t ELOS  Skilled Therapeutic Interventions/Progress Updates:  Patient seated upright in w/c on entrance to room. Patient alert and agreeable to PT session.   Patient with no pain complaint at start of session. Is fatigued from having just completed a therapy session with OT.   Relates having received new AFOs from [facility] just prior to therapist arrival. Shoes and AFOs donned for session. Pt's new shoes fit much better and don easier d/t solid backs. Is open to focus on ambulation during session.   Therapeutic Activity: Transfers: Pt performed sit<>stand and stand pivot transfers throughout session with supervision. Places both hands on rollator to assist with BUE push to stance. Provided vc for split hand rise to stance when available.  Gait Training:  Using new personal rollator, pt ambulated 840' x1 in 10min23sec nearly completing . Following seated rest break, pt ambulates 184' x1/ 470' x1. Demonstrated good, consistent pace with adequate step height.  Patient seated EOB at end of session with brakes locked, bed alarm set, and all needs within reach. RN and NT notified as to pt's disposition.    Therapy Documentation Precautions:  Precautions Precautions: Back Precaution Comments: Pt able to recall 3/3 precautions. Required Braces or Orthoses: Other  Brace Other Brace: No brace ordered, LSO present in room and pt reports Dr. Wynetta Emery had her get one prior to surgery. Restrictions Weight Bearing Restrictions: No  Pain: Pain Assessment Pain Score: 0-No pain related this session.    Therapy/Group: Individual Therapy  Loel Dubonnet PT, DPT, CSRS 03/17/2023, 5:47 PM

## 2023-03-17 NOTE — Progress Notes (Signed)
Patient ID: Jill Holt, female   DOB: 1970/08/28, 52 y.o.   MRN: [id number]  SW spoke with Diane Herrington/RN with [place] [employer] confirming [facility] referral received and assessment scheduled for 12/3. Aware pt going home with HHPT/OT as well.   Cecile Sheerer, MSW, LCSW Office: [phone] Cell: [phone] Fax: [phone]

## 2023-03-17 NOTE — Progress Notes (Signed)
Occupational Therapy Session Note  Patient Details  Name: Jill Holt MRN: [id number] Date of Birth: 04/02/1971  Today's Date: 03/17/2023 OT Individual Time: 4696-2952 OT Individual Time Calculation (min): 41 min    Short Term Goals: Week 2:  OT Short Term Goal 1 (Week 2): STG=LTG 2/2 ELOS  Skilled Therapeutic Interventions/Progress Updates:    Patient agreeable to participate in OT session. Reports 7/10 pain level.   Patient participated in skilled OT session focusing on core strengthening and manual therapy for right quad pain/muscle tightness. Pt sitting on EOB upon therapy arrival and stated that she was unaware of changed OT session with plans to lay down due to back pain and fatigue. Pt agreed to complete OT treatment session at bed level to focus on core strengthening in order to help reduce LBP.   Bed mobility: Pt transitioned from sit to supine with SBA while maintaining back precautions requiring no physical assist. HOB slightly elevated and bed rail on right side was used.  Core strengthening: Supine, transverse abdominal contraction 10" hold, 5X, 1 set; Gluteal Squeeze, 10" hold, 10X, 1 set; Transverse abdominal march, completed 1 side at a time, 10X each side, 1 set, gait belt used to provide active assist to achieve movement. Handout provided for reference to complete as HEP.  Pt educated on importance of core strengthening which provides increased stability to low back and will help decrease pain.  Manual Therapy: Pt verbalized right quad pain/soreness. Max fascial restrictions palpated proximal to knee and midway up thigh. Mayofascial release completed to right quad muscle in order to decrease fascial restrictions and increase RLE mobility that's required to complete tasks such as bed mobility and functional transfers. Pt had good response to manual technique.  Therapy Documentation Precautions:  Precautions Precautions: Back Precaution Comments: Pt able to recall 3/3  precautions. Required Braces or Orthoses: Other Brace Other Brace: No brace ordered, LSO present in room and pt reports Dr. Wynetta Emery had her get one prior to surgery. Restrictions Weight Bearing Restrictions: No  Therapy/Group: Individual Therapy  [name], OTR/L,CBIS  Supplemental OT - [facility] and [facility] Secure Chat Preferred   03/17/2023, 12:57 PM

## 2023-03-17 NOTE — Progress Notes (Signed)
Occupational Therapy Session Note  Patient Details  Name: Jill Holt MRN: [id number] Date of Birth: 07/21/1970  Today's Date: 03/17/2023 OT Individual Time: 1420-1450 OT Individual Time Calculation (min): 30 min    Short Term Goals: Week 2:  OT Short Term Goal 1 (Week 2): STG=LTG 2/2 ELOS  Skilled Therapeutic Interventions/Progress Updates:      Therapy Documentation Precautions:  Precautions Precautions: Back Precaution Comments: Pt able to recall 3/3 precautions. Required Braces or Orthoses: Other Brace Other Brace: No brace ordered, LSO present in room and pt reports Dr. Wynetta Emery had her get one prior to surgery. Restrictions Weight Bearing Restrictions: No General: "I have really seen people's true colors now." Pt supine in bed upon OT arrival, agreeable to OT session.  Pain: no pain reported  ADL: Bed mobility: SBA supine>EOB Grooming: SBA standing at sink completing hand hygiene after toileting Toilet transfer: SBA using rollator ambulating from bed>toilet ~10 ft Toileting: SBA, able to complete pants management and hygiene after voiding urine Transfers: SBA to ambulate with rollator around room with no LOB/SOB  Other Treatments: OT provided therapeutic listening for pt current situation d/t pt upset about friends/family not coming to visit since she has been in the hospital.    Pt seated in W/C at end of session with W/C alarm donned, call light within reach and 4Ps assessed.    Therapy/Group: Individual Therapy  Velia Meyer, OTD, OTR/L 03/17/2023, 4:50 PM

## 2023-03-17 NOTE — Patient Instructions (Addendum)
Core strengthening for Low Back Pain   Transverse Abdominal Contraction 1. Transverse Abdominal Contraction: Lie on your back with both knees bent, feet flat. Tighten abdominal muscles by pulling your belly button towards your spine. Hold 10 seconds and relax. Repeat 5-10 times. Perform 2 times per day.     2. Gluteal Squeeze: Lie on your back with both knees bent and squeeze your buttocks together. Hold 10 seconds and relax. Repeat 10 times. Perform 2 times per day.    3. Transverse Abdominal March: Lie on your back with both knees bent. Tighten your abdominal muscles by bringing your belly button toward your spine. Slowly march by lifting one leg off the floor, then alternating to the other. Continue pulling your belly button toward spine during this exercise. Repeat 10 times on each side. Perform 2 times per day.

## 2023-03-18 LAB — GLUCOSE, CAPILLARY
Glucose-Capillary: 218 mg/dL — ABNORMAL HIGH (ref 70–99)
Glucose-Capillary: 104 mg/dL — ABNORMAL HIGH (ref 70–99)
Glucose-Capillary: 136 mg/dL — ABNORMAL HIGH (ref 70–99)
Glucose-Capillary: 119 mg/dL — ABNORMAL HIGH (ref 70–99)

## 2023-03-18 NOTE — Plan of Care (Signed)
  Problem: Consults Goal: RH SPINAL CORD INJURY PATIENT EDUCATION Description:  See Patient Education module for education specifics.  Outcome: Progressing   Problem: SCI BOWEL ELIMINATION Goal: RH STG MANAGE BOWEL WITH ASSISTANCE Description: STG Manage Bowel with min Assistance. Outcome: Progressing Goal: RH STG SCI MANAGE BOWEL WITH MEDICATION WITH ASSISTANCE Description: STG SCI Manage bowel with medication with min assistance. Outcome: Progressing Goal: RH STG SCI MANAGE BOWEL PROGRAM W/ASSIST OR AS APPROPRIATE Description: STG SCI Manage bowel program w/min assist or as appropriate. Outcome: Progressing   Problem: SCI BLADDER ELIMINATION Goal: RH STG MANAGE BLADDER WITH ASSISTANCE Description: STG Manage Bladder With min Assistance Outcome: Progressing Goal: RH STG MANAGE BLADDER WITH MEDICATION WITH ASSISTANCE Description: STG Manage Bladder With Medication With min Assistance. Outcome: Progressing   Problem: RH SKIN INTEGRITY Goal: RH STG SKIN FREE OF INFECTION/BREAKDOWN Description: Incision will remain intact and be free of infection/breakdown with min assist  Outcome: Progressing Goal: RH STG MAINTAIN SKIN INTEGRITY WITH ASSISTANCE Description: STG Maintain Skin Integrity With min Assistance. Outcome: Progressing Goal: RH STG ABLE TO PERFORM INCISION/WOUND CARE W/ASSISTANCE Description: STG Able To Perform Incision/Wound Care With min Assistance. Outcome: Progressing   Problem: RH SAFETY Goal: RH STG ADHERE TO SAFETY PRECAUTIONS W/ASSISTANCE/DEVICE Description: STG Adhere to Safety Precautions With min Assistance/Device. Outcome: Progressing Goal: RH STG DECREASED RISK OF FALL WITH ASSISTANCE Description: STG Decreased Risk of Fall With cueing Assistance. Outcome: Progressing   Problem: RH PAIN MANAGEMENT Goal: RH STG PAIN MANAGED AT OR BELOW PT'S PAIN GOAL Description: Less than 4 with PRN medications min assist  Outcome: Progressing   Problem: RH KNOWLEDGE  DEFICIT SCI Goal: RH STG INCREASE KNOWLEDGE OF SELF CARE AFTER SCI Description: Patient will be able to manage medications, bowels, and diet/ lifestyle modifications to improve A1C of 7.8 from nursing education and nursing handouts independently  Outcome: Progressing

## 2023-03-18 NOTE — Progress Notes (Signed)
Patient ID: Jill Holt, female   DOB: [birth year], 52 y.o.   MRN: [id number]  SW met with pt in room to provide updates from team conference, d/c date remains 11/21, pt set up for [facility] for PT/OT, [facility] referral made and assessment with nursing on 12/3, and DME in room TTB and rollator.   1256- SW spoke with pt dtr [place] informing on above. No questions/concerns reported.    Cecile Sheerer, MSW, LCSW Office: [phone] Cell: [phone] Fax: [phone]

## 2023-03-18 NOTE — Patient Care Conference (Signed)
Inpatient RehabilitationTeam Conference and Plan of Care Update Date: 03/18/2023   Time: 11:15 AM    Patient Name: Jill Holt      Medical Record Number: [id number]  Date of Birth: [birth year] Sex: Female         Room/Bed: 4M12C/4M12C-01 Payor Info: Payor: [place] MEDICAID PREPAID HEALTH PLAN / Plan: [place] MEDICAID HEALTHY BLUE / Product Type: *No Product type* /    Admit Date/Time:  03/07/2023  3:53 PM  Primary Diagnosis:  Lumbar radiculopathy  Hospital Problems: Principal Problem:   Lumbar radiculopathy Active Problems:   Coping with chronic pain    Expected Discharge Date: Expected Discharge Date: 03/20/23  Team Members Present: Physician leading conference: Dr. Genice Rouge Social Worker Present: Cecile Sheerer, LCSWA Nurse Present: Vedia Pereyra, RN PT Present: Bernie Covey, PT OT Present: Roney Mans, OT;Ardis Rowan, COTA PPS Coordinator present : Fae Pippin, SLP     Current Status/Progress Goal Weekly Team Focus  Bowel/Bladder   cont of B&B   remain cont of B&B   assist with toileting as needed    Swallow/Nutrition/ Hydration               ADL's   bathing-supervision; LB dressing-min a; toileting-supervision; transfers-CGA/supervision   mod I overall   BADLs, transfers, discharge planning    Mobility   approaching goal level, CGA-supervision with AFOs   mod I overall  gait and stair training    Communication                Safety/Cognition/ Behavioral Observations               Pain   some pain reported at times, pain management with current medications   pain remain in control with current management   assess pain qshift and prn    Skin   bakc sx incision   sx incision remain free from infection  assess skin qshift and prn      Discharge Planning:  Pt will d/c to home with her dtr who will be the primary caregiver. Pt will be intermittent to supervision level of care as dtr is 4 weeks postpartum and unable to provide any  physical assistane. HHPT/OT-[facility]. fam edu completed on 11/13 with pt dtr. DME ordered- TTB, and rollator with [employer]. P[facility] referral made to insurance and assessment on 12/3. SW will confirm there are no barriers to discharge.   Team Discussion: Lumbar radiculopathy. Continent of bowel/bladder. Pain to back and bilateral lower extremity managed with scheduled and PRN medications. Incision to back has attached edges without drainage. Corset back brace OOB. Patient cutting bed/wall alarms off. Blood pressure improving but HR remains elevated. Gait and stair training continue.  Tolerating carb-mod diet.  AC/HS.  Patient on target to meet rehab goals: yes, meeting goals with discharge date of 03/20/23  *See Care Plan and progress notes for long and short-term goals.   Revisions to Treatment Plan:  Increasing metoprolol.  Monitor labs and VS.  Teaching Needs: Medications, safety, self care, gait/transfer training, skin care, diet/lifestyle modifications, etc.   Current Barriers to Discharge: Decreased caregiver support and Wound care  Possible Resolutions to Barriers: Family education completed DME ordered     Medical Summary Current Status: CBgs are labile- because sometimes refuses 70/30 insulin-  Barriers to Discharge: Behavior/Mood;Uncontrolled Hypertension;Self-care education;Symptomatic Anemia;Weight bearing restrictions;Other (comments)  Barriers to Discharge Comments: limited by- pain; getting OOB; reposessed car yesterday ;tachycardic  - H/H for home- equipment in room; quad cane and rolator  Possible Resolutions to [employer]: reducing gabapnetin to help with swelling- better- and increaisng BP meds- d/c 11/21   Continued Need for Acute Rehabilitation Level of Care: The patient requires daily medical management by a physician with specialized training in physical medicine and rehabilitation for the following reasons: Direction of a multidisciplinary physical  rehabilitation program to maximize functional independence : Yes Medical management of patient stability for increased activity during participation in an intensive rehabilitation regime.: Yes Analysis of laboratory values and/or radiology reports with any subsequent need for medication adjustment and/or medical intervention. : Yes   I attest that I was present, lead the team conference, and concur with the assessment and plan of the team.   Jearld Adjutant 03/18/2023, 3:15 PM

## 2023-03-18 NOTE — Progress Notes (Signed)
Physical Therapy Session Note  Patient Details  Name: Jill Holt MRN: [id number] Date of Birth: Mar 24, 1971  Today's Date: 03/18/2023 PT Individual Time: 1015-1100 PT Individual Time Calculation (min): 45 min   Short Term Goals: Week 1:  PT Short Term Goal 1 (Week 1): pt will perform bed mobiltiy with min a PT Short Term Goal 1 - Progress (Week 1): Met PT Short Term Goal 2 (Week 1): Pt will ambulate 100 ft with assist PT Short Term Goal 2 - Progress (Week 1): Met PT Short Term Goal 3 (Week 1): Pt will navigate 3" stairs with assist PT Short Term Goal 3 - Progress (Week 1): Met  Skilled Therapeutic Interventions/Progress Updates:    pt received in w/c and agreeable to therapy. No complaint of pain. Pt emotional d/t car being repossessed, so ambulated to [facility] entrance for therapeutic benefit of sunshine on mood and affect. Pt ambulated >500 ft with rollator and supervision over level and unlevel surfaces. Adjusted height for improved posture and mechanics. Pt ambulated up/down steep incline in the same manner. Seated rest breaks for energy conservation and safety. Discussed home set up, and common community  environments for safety and participation in important events. Pt returned to room and to bed, doffing AFOs with supervision/VC. Pt was left with all needs in reach and alarm active.   Therapy Documentation Precautions:  Precautions Precautions: Back Precaution Comments: Pt able to recall 3/3 precautions. Required Braces or Orthoses: Other Brace Other Brace: No brace ordered, LSO present in room and pt reports Dr. Wynetta Emery had her get one prior to surgery. Restrictions Weight Bearing Restrictions: No General: PT Amount of Missed Time (min): 13 Minutes PT Missed Treatment Reason: Other (Comment) (mental health)    Therapy/Group: Individual Therapy  [name] 03/18/2023, 12:25 PM

## 2023-03-18 NOTE — Progress Notes (Signed)
Occupational Therapy Session Note  Patient Details  Name: Jill Holt MRN: [id number] Date of Birth: 27-Aug-1970  Today's Date: 03/18/2023 OT Individual Time: 0230-0339 OT Individual Time Calculation (min): 69 min    Short Term Goals: Week 2:  OT Short Term Goal 1 (Week 2): STG=LTG 2/2 ELOS  Skilled Therapeutic Interventions/Progress Updates:      Therapy Documentation Precautions:  Precautions Precautions: Back Precaution Comments: Pt able to recall 3/3 precautions. Required Braces or Orthoses: Other Brace Other Brace: No brace ordered, LSO present in room and pt reports Dr. Wynetta Emery had her get one prior to surgery. Restrictions Weight Bearing Restrictions: No General: "I am just angry" Pt supine in bed upon OT arrival, agreeable to OT session.  Pain:  8/10 pain reported in tail bone, activity, intermittent rest breaks, distractions provided for pain management, pt reports tolerable to proceed.   ADL: Transfers: pt ambulated with rollator distant supervision-mod I with bilateral AFO from room><therapy gym Footwear: Pt donned/doffed AFOs Mod I with increased time  Exercises: Pt issued UE theraband HEP in order to increase functional strength, andurance and activity tolerance in order to increase independence in ADLs such as bathing. Pt issued yellow theraband and discussed direction/technique of exercises, demonstrating verbal understanding. Pt completed 3x10 exercises at bed level listed below: -elbow extensions - shoulder horizontal abduction -bicep curls  -shoulder flexion -diagonal shoulder flexion -external rotation -forward punches  Other Treatments: Pt and OT reviewed safety plan and discussed making pt Mod I level in room tomorrow morning. OT discussed making pt mod I tomorrow AM day before D/C. OT discussed with primary OTA and PT as well as nursing.   Pt supine in bed with bed alarm activated, 2 bed rails up, call light within reach and 4Ps  assessed.   Therapy/Group: Individual Therapy  Velia Meyer, OTD, OTR/L 03/18/2023, 3:03 PM

## 2023-03-18 NOTE — Progress Notes (Signed)
Occupational Therapy Session Note  Patient Details  Name: Jill Holt MRN: [id number] Date of Birth: 05/24/1970  Today's Date: 03/18/2023 OT Individual Time: 4696-2952 OT Individual Time Calculation (min): 56 min    Short Term Goals: Week 2:  OT Short Term Goal 1 (Week 2): STG=LTG 2/2 ELOS  Skilled Therapeutic Interventions/Progress Updates:    Pt resting in w/c upon arrival. Pt initially upset because her car was "taken away" this morning. Emotional support provided. OT intervention with focus on functional amb with RW, dynamic standing balance, and discharge planning to increase independene with BADLs. Pt amb with Rollator to day room and engaged in variety of Wii games and balance activities. Pt engaged in Wii bowling activity, standing for complete game. No UE support or LOB noted. Pt engaged in Wii balance activity on balance board-raft on river and penguin balance activity. Focus on weight shifts through BLE and hips with limited UB shifts. Pt amb with Rollator back to room. Pt remarked that she was in better spirits after therapy. Pt remained in w/c with all needs within reach.   Therapy Documentation Precautions:  Precautions Precautions: Back Precaution Comments: Pt able to recall 3/3 precautions. Required Braces or Orthoses: Other Brace Other Brace: No brace ordered, LSO present in room and pt reports Dr. Wynetta Emery had her get one prior to surgery. Restrictions Weight Bearing Restrictions: No   Pain: Pt c/o "upset" stomach and numerous BM this morning Therapy/Group: Individual Therapy  Rich Brave 03/18/2023, 10:52 AM

## 2023-03-18 NOTE — Progress Notes (Signed)
Reviewed safety plan, unit protocols for all alarms (bed/chair). MD aware that patient will at times cut her bed alarm off to use the bathroom.     Tilden Dome, LPN

## 2023-03-18 NOTE — Progress Notes (Signed)
Physical Therapy Session Note  Patient Details  Name: Jill Holt MRN: [id number] Date of Birth: 08-Jun-1970  Today's Date: 03/18/2023 PT Individual Time: 0800-0832 PT Individual Time Calculation (min): 32 min  and Today's Date: 03/18/2023 PT Missed Time: 13 Minutes Missed Time Reason: Other (Comment) (mental health)  Short Term Goals: Week 2:  PT Short Term Goal 1 (Week 2): =LTGs d/t ELOS  Skilled Therapeutic Interventions/Progress Updates:      Therapy Documentation Precautions:  Precautions Precautions: Back Precaution Comments: Pt able to recall 3/3 precautions. Required Braces or Orthoses: Other Brace Other Brace: No brace ordered, LSO present in room and pt reports Dr. Wynetta Emery had her get one prior to surgery. Restrictions Weight Bearing Restrictions: No  Pt received toileting and reports 3 bowel movements within past few hours. Pt agreeable to attempt PT and dependent for donning of ted hose and bilateral AFO's/shoes.   Pt supervision for toileting, transfers and gait 50 ft x 2 with rollator to ortho gym. Pt received news mid session that affected mood and PT utilized therapeutic use of self to provide support and encouragement. Pt agreeable to global LE strength training and educated on mini squats 2 x 6 and sit<>stand without AD with min multi-modal cues for head/hips relationship and use of momentum.   Pt emotional and deferred further participation in session. Pt left seated at bedside with nurse present.     Therapy/Group: Individual Therapy  Truitt Leep Truitt Leep PT, DPT  03/18/2023, 8:36 AM

## 2023-03-18 NOTE — Progress Notes (Signed)
PROGRESS NOTE   Subjective/Complaints:  Pt reports LBM 2 days ago- doesn't want intervention quite yet.  Per nursing, pt got up to bathroom on own-  Per pt slept all night Not feeling "great' this AM.  Uncomfortable all over- hasn't gotten pain meds yet.  Was restless this Am/last night.   Said HR usually runs in 70's.  ROS:    Pt denies SOB, abd pain, CP, N/V/C/D, and vision changes   Objective:   No results found. No results for input(s): "WBC", "HGB", "HCT", "PLT" in the last 72 hours.  No results for input(s): "NA", "K", "CL", "CO2", "GLUCOSE", "BUN", "CREATININE", "CALCIUM" in the last 72 hours.   Intake/Output Summary (Last 24 hours) at 03/18/2023 0817 Last data filed at 03/17/2023 1811 Gross per 24 hour  Intake 480 ml  Output --  Net 480 ml        Physical Exam: Vital Signs Blood pressure (!) 147/80, pulse (!) 103, temperature 98.7 F (37.1 C), temperature source Oral, resp. rate 16, height 5\' 4"  (1.626 m), weight 90.6 kg, last menstrual period 05/31/2022, SpO2 99%.    General: awake, alert, appropriate, siting up in wheelchair at bedside; NAD HENT: conjugate gaze; oropharynx moist CV: regular rhythm; mildly tachycardic; no JVD Pulmonary: CTA B/L; no W/R/R- good air movement GI: soft, NT, ND, (+)BS- slightly hypoactive Psychiatric: appropriate- more down mood this AM Neurological: Ox3  Extremities- no LE edema today B/L  Skin: Wound dressed, bandage clean/opsite--didn't remove Neurologic: Cranial nerves II through XII intact, motor strength is 5/5 in bilateral deltoid, bicep, tricep, grip, pain still a barrier to LE. 2-3/5  hip flexor, knee extensors, ankle dorsiflexor and plantar flexor--stable 11/17 Sensory exam hypersensitive to LT L5-S1 , intact LT L2-S1  Musculoskeletal: Full range of motion in all 4 extremities. No joint swelling   Assessment/Plan: 1. Functional deficits which require 3+  hours per day of interdisciplinary therapy in a comprehensive inpatient rehab setting. Physiatrist is providing close team supervision and 24 hour management of active medical problems listed below. Physiatrist and rehab team continue to assess barriers to discharge/monitor patient progress toward functional and medical goals  Care Tool:  Bathing    Body parts bathed by patient: Right arm, Left arm, Chest, Abdomen, Front perineal area, Face, Right upper leg, Left upper leg   Body parts bathed by helper: Right lower leg, Left lower leg, Buttocks     Bathing assist Assist Level: Minimal Assistance - Patient > 75%     Upper Body Dressing/Undressing Upper body dressing   What is the patient wearing?: Pull over shirt, Bra    Upper body assist Assist Level: Set up assist    Lower Body Dressing/Undressing Lower body dressing      What is the patient wearing?: Underwear/pull up, Pants     Lower body assist Assist for lower body dressing: Set up assist     Toileting Toileting    Toileting assist Assist for toileting: Supervision/Verbal cueing     Transfers Chair/bed transfer  Transfers assist     Chair/bed transfer assist level: Supervision/Verbal cueing     Locomotion Ambulation   Ambulation assist      Assist level:  Supervision/Verbal cueing Assistive device: Rollator Max distance: 100+   Walk 10 feet activity   Assist     Assist level: Contact Guard/Touching assist Assistive device: Walker-rolling   Walk 50 feet activity   Assist Walk 50 feet with 2 turns activity did not occur: Safety/medical concerns         Walk 150 feet activity   Assist Walk 150 feet activity did not occur: Safety/medical concerns         Walk 10 feet on uneven surface  activity   Assist Walk 10 feet on uneven surfaces activity did not occur: Safety/medical concerns         Wheelchair     Assist Is the patient using a wheelchair?: Yes Type of  Wheelchair: Manual    Wheelchair assist level: Dependent - Patient 0% Max wheelchair distance: 150    Wheelchair 50 feet with 2 turns activity    Assist        Assist Level: Dependent - Patient 0%   Wheelchair 150 feet activity     Assist      Assist Level: Dependent - Patient 0%   Blood pressure (!) 147/80, pulse (!) 103, temperature 98.7 F (37.1 C), temperature source Oral, resp. rate 16, height 5\' 4"  (1.626 m), weight 90.6 kg, last menstrual period 05/31/2022, SpO2 99%.  Medical Problem List and Plan: 1. 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  Status post redo decompressive laminectomies L3-4 and L4-5 with complete facetectomies and radical foraminotomies , L3-03/03/2023 LE weakness is in part due to pain, will optimize regimen              -patient may shower             -ELOS/Goals: 8-11 days with mod I goals PT, OT             -PRAFO's for both LE's at night             -will need AFO's for gait  D/c 11/21  COn't CIR PT and OT Team conference today to finalize d/c  2. Antithrombotics: -DVT/anticoagulation:  Mechanical:  Antiembolism stockings, knee (TED hose) Bilateral lower extremities.  Check vascular study.  Discussed with neurosurgery on beginning Lovenox 11/11- Dopplers (-)               -antiplatelet therapy: N/A 3. Pain Management: currently on 600 mg twice daily, Flexeril and hydrocodone as needed             -pt was on gabapentin 900mg  tid at home--increase to 600mg  tid to start  11/13- pain getting better  11/14- will reduce gabapentin to 300 mg TID- and see if swelling improves- she feels with reduction in pain, can reduce gabapentin  11/15- pain didn't increase- with reduction in gabapentin- swelling has improved  11/17 appears controlled.  continue with current plan  11/18- pain well controlled- con't low dose gabapentin   11/19- we discussed stopping gabapentin-  since swelling gone, I don't think we should stop it since helps nerve pain- pt agreed- will keep for now 4. Mood/Behavior/Sleep: Provide emotional support- labile this am , "feels overwhelmed by medical issues "             -antipsychotic agents: N/A 5. Neuropsych/cognition: This patient is capable of making decisions on her own behalf. 6. Skin/Wound Care: Routine skin checks 7. Fluids/Electrolytes/Nutrition: Routine in and outs with follow-up chemistries 8.  Hypertension.  Norvasc 10 mg  daily, Cozaar 100 mg daily, hydrochlorothiazide 12.5 mg daily, Toprol-XL 25 mg daily.  Monitor with increased mobility  11/11- BP controlled after meds taken for AM- will monitor- is labile before/after meds  11/13- HR low 100s and BP running 140s-160s- increased Toprol XL tl 50 mg daily since HR and BP elevated  11/15- - Metoprolol increased yesterday- HR down to 96 this AM- will monitor and titrate as required  11/17 BP/HR remained elevated despite feeling better today   -will increase metoprolol to 75mg  daily (XL)  11/18- just got increase in Metoprolol- so will wait to make changes  11/19- BP doing better overall- HR still low 100s- but BP is coming down slowly- con't regimen for now Vitals:   03/17/23 1940 03/18/23 0458  BP: 135/79 (!) 147/80  Pulse: (!) 103 (!) 103  Resp: 16 16  Temp: 98.4 F (36.9 C) 98.7 F (37.1 C)  SpO2: 100% 99%    9.  Diabetes mellitus.  Hemoglobin A1c 7.8.  NovoLog Mix 70/30 35 units with breakfast, 25 units with supper, Glucotrol XL 10 mg daily. CBG (last 3)  Recent Labs    03/17/23 1648 03/17/23 2149 03/18/23 0649  GLUCAP 285* 153* 218*    11/11- CBGs dropped to 44/34 last night due to not eating dinner and family not bringing in food like planned- will order to give 70/30 only if BG >120- and has food- changed both orders  11/14- Cbgs elevated x1- will monitor- usually when refuses 70/30  11/15- Dropped CBG to 66 last night- wil reduce night time insulin to 70/30  15 units from 25 units- she noted at home didn't take insulin unless CBGs >200- unless took ~ 10 units max.   11/17 increased pm 70/30 to 20 units for better AM control. Sugars better this morning. Observe today  11/19- CBG 218 this AM- given 70/30 insulin- also got last night's 70/30 as wlel- didn't dip below 70 10.  Hyperlipidemia.  Lipitor 11.  Constipation.  Colace twice daily, MiraLAX BID, have added Amitiza for OIC, give dulc supp this pm if no BM   11/11- had BM yesterday feeling much better  11/12- LBM yesterday   11/13- said needed ot have BM- has not today- will give Sorbitol, etc tomorrow if no BM  11/14- Will give Sorbitol today and change Miralax to 2 senna daily.   11/18- LBM yesterday 11/19-LBM 2 days ago- large- type 5- if no BM by tomorrow- will need to intervene   12. Mild hypoxia:             -wean oxygen as able             -check CBC in am 13. C/o dizziness while up with therapy. Sinus tachy on exam             -CBC as above, check BMET as well             -encourage fluids   11/12- Still mildly tachycardic- even at rest- might be anemia- last Hb 8.9 (getting better from prior 8.5,) but pt used to 12-13 Hb-    11/16 consider adjusting metoprolol--recheck cbc monday  11/19- will check CBC tomorrow 14.  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 Nausea:  -pt feels  it's due to lipitor which was started new in hospital.   -11/17 stopped it yesterday and she is feeling better. Can discuss with primary as outpt other options.    11/18- feeling better   I spent a total of 37   minutes on total care today- >50% coordination of care- due to  D/w pt about how she feels this AM; d/w pt about edema/lack of- pain meds; and getting OOB without nursing- also d/w nursing about pts function overnight.    LOS: 11 days A FACE TO FACE EVALUATION WAS PERFORMED  Italo Banton 03/18/2023, 8:17 AM

## 2023-03-19 ENCOUNTER — Other Ambulatory Visit (HOSPITAL_COMMUNITY): Payer: Self-pay

## 2023-03-19 LAB — CBC WITH DIFFERENTIAL/PLATELET
Abs Immature Granulocytes: 0.01 10*3/uL (ref 0.00–0.07)
Basophils Absolute: 0.1 10*3/uL (ref 0.0–0.1)
Basophils Relative: 1 %
Eosinophils Absolute: 0.4 10*3/uL (ref 0.0–0.5)
Eosinophils Relative: 8 %
HCT: 30.1 % — ABNORMAL LOW (ref 36.0–46.0)
Hemoglobin: 9.3 g/dL — ABNORMAL LOW (ref 12.0–15.0)
Immature Granulocytes: 0 %
Lymphocytes Relative: 27 %
Lymphs Abs: 1.3 10*3/uL (ref 0.7–4.0)
MCH: 23 pg — ABNORMAL LOW (ref 26.0–34.0)
MCHC: 30.9 g/dL (ref 30.0–36.0)
MCV: 74.3 fL — ABNORMAL LOW (ref 80.0–100.0)
Monocytes Absolute: 0.5 10*3/uL (ref 0.1–1.0)
Monocytes Relative: 11 %
Neutro Abs: 2.5 10*3/uL (ref 1.7–7.7)
Neutrophils Relative %: 53 %
Platelets: 395 10*3/uL (ref 150–400)
RBC: 4.05 MIL/uL (ref 3.87–5.11)
RDW: 15.3 % (ref 11.5–15.5)
WBC: 4.8 10*3/uL (ref 4.0–10.5)
nRBC: 0 % (ref 0.0–0.2)

## 2023-03-19 LAB — BASIC METABOLIC PANEL
Anion gap: 11 (ref 5–15)
BUN: 7 mg/dL (ref 6–20)
CO2: 28 mmol/L (ref 22–32)
Calcium: 9.3 mg/dL (ref 8.9–10.3)
Chloride: 100 mmol/L (ref 98–111)
Creatinine, Ser: 0.72 mg/dL (ref 0.44–1.00)
GFR, Estimated: >60 mL/min (ref >60)
Glucose, Bld: 120 mg/dL — ABNORMAL HIGH (ref 70–99)
Potassium: 3.8 mmol/L (ref 3.5–5.1)
Sodium: 139 mmol/L (ref 135–145)

## 2023-03-19 LAB — GLUCOSE, CAPILLARY
Glucose-Capillary: 116 mg/dL — ABNORMAL HIGH (ref 70–99)
Glucose-Capillary: 205 mg/dL — ABNORMAL HIGH (ref 70–99)
Glucose-Capillary: 156 mg/dL — ABNORMAL HIGH (ref 70–99)
Glucose-Capillary: 128 mg/dL — ABNORMAL HIGH (ref 70–99)

## 2023-03-19 MED ORDER — AMLODIPINE BESYLATE 10 MG PO TABS
10.0000 mg | ORAL_TABLET | Freq: Every morning | ORAL | 0 refills | Status: AC
  Filled 2023-03-19: qty 30, 30d supply, fill #0

## 2023-03-19 MED ORDER — INSULIN ASPART PROT & ASPART (70-30 MIX) 100 UNIT/ML PEN
PEN_INJECTOR | SUBCUTANEOUS | 11 refills | Status: AC
  Filled 2023-03-19: qty 18, 33d supply, fill #0

## 2023-03-19 MED ORDER — INSULIN ASPART PROT & ASPART (70-30 MIX) 100 UNIT/ML ~~LOC~~ SUSP
20.0000 [IU] | Freq: Every day | SUBCUTANEOUS | 11 refills | Status: DC
  Filled 2023-03-19: qty 10, 50d supply, fill #0

## 2023-03-19 MED ORDER — GABAPENTIN 300 MG PO CAPS
300.0000 mg | ORAL_CAPSULE | Freq: Three times a day (TID) | ORAL | 0 refills | Status: AC
  Filled 2023-03-19: qty 90, 30d supply, fill #0

## 2023-03-19 MED ORDER — GLIPIZIDE ER 10 MG PO TB24
10.0000 mg | ORAL_TABLET | Freq: Every day | ORAL | 0 refills | Status: AC
  Filled 2023-03-19: qty 30, 30d supply, fill #0

## 2023-03-19 MED ORDER — METOPROLOL SUCCINATE ER 25 MG PO TB24
25.0000 mg | ORAL_TABLET | Freq: Every day | ORAL | 0 refills | Status: AC
  Filled 2023-03-19: qty 30, 30d supply, fill #0

## 2023-03-19 MED ORDER — HYDROCODONE-ACETAMINOPHEN 5-325 MG PO TABS
1.0000 | ORAL_TABLET | ORAL | 0 refills | Status: AC | PRN
  Filled 2023-03-19: qty 30, 3d supply, fill #0

## 2023-03-19 MED ORDER — LOSARTAN POTASSIUM-HCTZ 100-12.5 MG PO TABS
1.0000 | ORAL_TABLET | Freq: Every morning | ORAL | 0 refills | Status: AC
  Filled 2023-03-19: qty 30, 30d supply, fill #0

## 2023-03-19 MED ORDER — CYCLOBENZAPRINE HCL 10 MG PO TABS
10.0000 mg | ORAL_TABLET | Freq: Three times a day (TID) | ORAL | 0 refills | Status: AC | PRN
  Filled 2023-03-19: qty 60, 20d supply, fill #0

## 2023-03-19 MED ORDER — INSULIN PEN NEEDLE 32G X 4 MM MISC
0 refills | Status: AC
  Filled 2023-03-19: qty 100, fill #0

## 2023-03-19 MED ORDER — OMEPRAZOLE 40 MG PO CPDR
40.0000 mg | DELAYED_RELEASE_CAPSULE | Freq: Every day | ORAL | 0 refills | Status: AC
  Filled 2023-03-19: qty 30, 30d supply, fill #0

## 2023-03-19 MED ORDER — INSULIN PEN NEEDLE 32G X 4 MM MISC
0 refills | Status: DC
  Filled 2023-03-19: qty 100, 34d supply, fill #0

## 2023-03-19 MED ORDER — LUBIPROSTONE 24 MCG PO CAPS
24.0000 ug | ORAL_CAPSULE | Freq: Two times a day (BID) | ORAL | 0 refills | Status: AC
  Filled 2023-03-19: qty 60, 30d supply, fill #0

## 2023-03-19 MED ORDER — INSULIN ASPART PROT & ASPART (70-30 MIX) 100 UNIT/ML ~~LOC~~ SUSP
35.0000 [IU] | Freq: Every day | SUBCUTANEOUS | 11 refills | Status: DC
  Filled 2023-03-19: qty 10, 28d supply, fill #0

## 2023-03-19 NOTE — Progress Notes (Signed)
Inpatient Rehabilitation Discharge Medication Review by a Pharmacist  A complete drug regimen review was completed for this patient to identify any potential clinically significant medication issues.  High Risk Drug Classes Is patient taking? Indication by Medication  Antipsychotic No   Anticoagulant No   Antibiotic No   Opioid Yes Norco - pain   Antiplatelet No   Hypoglycemics/insulin Yes Glipizide, Novolog 70/30 - DM  Vasoactive Medication Yes Norvasc, Hyzaar, Toprol - HTN  Chemotherapy No   Other Yes Tylenol - pain  Flexeril - muscle spasms  Gabapentin - Neuropathic pain  Amitiza, Colace - constipation  Prilosec - GERD     Type of Medication Issue Identified Description of Issue Recommendation(s)  Drug Interaction(s) (clinically significant)     Duplicate Therapy     Allergy     No Medication Administration End Date     Incorrect Dose     Additional Drug Therapy Needed     Significant med changes from prior encounter (inform family/care partners about these prior to discharge).    Other       Clinically significant medication issues were identified that warrant physician communication and completion of prescribed/recommended actions by midnight of the next day:  No  Name of provider notified for urgent issues identified:   Provider Method of Notification:   Pharmacist comments: Patient refused to take any statin medication  Time spent performing this drug regimen review (minutes):  20 minutes  Loura Back, PharmD, BCPS Clinical Pharmacist Clinical phone for 03/19/2023 is [phone] 03/19/2023 7:36 AM

## 2023-03-19 NOTE — Progress Notes (Signed)
Occupational Therapy Session Note  Patient Details  Name: Jill Holt MRN: [id number] Date of Birth: Sep 20, 1970  Today's Date: 03/19/2023 OT Individual Time: 0930-1040 OT Individual Time Calculation (min): 70 min    Short Term Goals: Week 2:  OT Short Term Goal 1 (Week 2): STG=LTG 2/2 ELOS  Skilled Therapeutic Interventions/Progress Updates:    Pt resting in w/c upon arrival. OT intervention with focus on functional amb with Rollator, home safety, dynamic standing balance, and activity tolerance to increase independence with BADLs and prepare for discharge home tomorrow. Dynamic standing activities to increase pt's reactions and weight shifts: tossing ball against rebounder while standing on Airex 4x15; Biodex activities to provided visual feedback on weight shifts. Pt noted with improved weight shifts with visual feedback. Pt amb with Rollator to day room. Sit<>stand from std chair without arm rests. Pt reports her chair at home is somewhat taller. Pt completed sit<>stand 3x5. Educated pt on importance of correct technique with sit<>stand. Pt verbalized understanding of all recommendations. Pt returned to room and remained seated in w/c. Pt mod I in room.  Therapy Documentation Precautions:  Precautions Precautions: Back Precaution Comments: Pt able to recall 3/3 precautions. Required Braces or Orthoses: Other Brace Other Brace: No brace ordered, LSO present in room and pt reports Dr. Wynetta Emery had her get one prior to surgery. Restrictions Weight Bearing Restrictions: No    Pain: Pt reports her pain level is "ok"   Therapy/Group: Individual Therapy  Rich Brave 03/19/2023, 10:42 AM

## 2023-03-19 NOTE — Progress Notes (Signed)
Occupational Therapy Discharge Summary  Patient Details  Name: Jill Holt MRN: [id number] Date of Birth: 19-Jan-1971  Date of Discharge from OT service:March 19, 2023  Patient has met 8 of 8 long term goals due to {due to:3041651}.  Pt made steady progress with BADLs, IADLs, and functional transfers during this admission. Pt is mod I for bathing at shower level, dressing (including donning AFOs), toileting, and all functional tranfsers with Rollator. Pt is supervision for simple meal prepr using Rollator. Pt's daughter has participated in education and both pt/daughter verbalized understanding of all recommendations and home safety education. Patient to discharge at overall {LOA:3049010} level.  Patient's care partner {care partner:3041650} to provide the necessary {assistance:3041652} assistance at discharge.    Reasons goals not met: n/a  Recommendation:  Patient will benefit from ongoing skilled OT services in {setting:3041680} to continue to advance functional skills in the area of {ADL/iADL:3041649}.  Equipment: TTB  Reasons for discharge: {Reason for discharge:3049018}  Patient/family agrees with progress made and goals achieved: {Pt/Family agree with progress/goals:3049020}  OT Discharge ADL ADL Equipment Provided: Reacher Eating: Independent Where Assessed-Eating: Wheelchair Grooming: Independent Where Assessed-Grooming: Sitting at sink Upper Body Bathing: Modified independent Where Assessed-Upper Body Bathing: Shower Lower Body Bathing: Modified independent Where Assessed-Lower Body Bathing: Shower Upper Body Dressing: Modified independent (Device) Where Assessed-Upper Body Dressing: Sitting at sink Lower Body Dressing: Modified independent Where Assessed-Lower Body Dressing: Standing at sink, Wheelchair Toileting: Modified independent Where Assessed-Toileting: Toilet, Psychiatrist Transfer: Modified independent Statistician Method:  Proofreader: Bedside commode, Grab bars Tub/Shower Transfer: Modified independent Web designer Method: Ship broker: Insurance underwriter: Modified independent Film/video editor Method: Designer, industrial/product: Sales promotion account executive Baseline Vision/History: 1 Wears glasses Patient Visual Report: No change from baseline Vision Assessment?: No apparent visual deficits Perception  Perception: Within Functional Limits Praxis Praxis: WFL Cognition Cognition Overall Cognitive Status: Within Functional Limits for tasks assessed Arousal/Alertness: Awake/alert Orientation Level: Person;Place;Situation Person: Oriented Place: Oriented Situation: Oriented Memory: Appears intact Attention: Sustained Sustained Attention: Appears intact Awareness: Appears intact Problem Solving: Appears intact Safety/Judgment: Appears intact Brief Interview for Mental Status (BIMS) Repetition of Three Words (First Attempt): 3 Temporal Orientation: Year: Correct Temporal Orientation: Month: Accurate within 5 days Temporal Orientation: Day: Correct Recall: "Sock": Yes, no cue required Recall: "Blue": Yes, no cue required Recall: "Bed": Yes, no cue required BIMS Summary Score: 15 Sensation Sensation Light Touch: Impaired by gross assessment Peripheral sensation comments: Numbness/pressure in LLE, some numbness/tingling in RLE; Light Touch Impaired Details: Impaired RLE;Impaired LLE Hot/Cold: Appears Intact Proprioception: Appears Intact Stereognosis: Not tested Coordination Gross Motor Movements are Fluid and Coordinated: Yes Fine Motor Movements are Fluid and Coordinated: Yes Motor  Motor Motor: Other (comment) Motor - Skilled Clinical Observations: muscle weakness, BIL foot drop Mobility  Bed Mobility Bed Mobility: Supine to Sit;Sit to Supine Supine to Sit: Independent with assistive device Sit to  Supine: Independent with assistive device Transfers Sit to Stand: Independent with assistive device Stand to Sit: Independent with assistive device  Trunk/Postural Assessment  Cervical Assessment Cervical Assessment: Within Functional Limits Thoracic Assessment Thoracic Assessment: Within Functional Limits Lumbar Assessment Lumbar Assessment: Exceptions to [facility] (LSO/spinal precautions) Postural Control Postural Control: Within Functional Limits  Balance Balance Balance Assessed: Yes Static Sitting Balance Static Sitting - Balance Support: Feet supported Static Sitting - Level of Assistance: 7: Independent Dynamic Sitting Balance Dynamic Sitting - Balance Support: During functional activity Dynamic Sitting - Level of Assistance: 7: Independent  Dynamic Sitting - Balance Activities: Forward lean/weight shifting;Reaching for Higher education careers adviser Standing - Balance Support: Bilateral upper extremity supported Static Standing - Level of Assistance: 6: Modified independent (Device/Increase time) Dynamic Standing Balance Dynamic Standing - Balance Support: During functional activity Dynamic Standing - Level of Assistance: 5: Stand by assistance Dynamic Standing - Balance Activities: Ball toss Dynamic Standing - Comments: on compliant and non-compliant surfaces Extremity/Trunk Assessment RUE Assessment RUE Assessment: Within Functional Limits LUE Assessment LUE Assessment: Within Functional Limits   Rich Brave 03/19/2023, 9:26 AM

## 2023-03-19 NOTE — Progress Notes (Signed)
Physical Therapy Discharge Summary  Patient Details  Name: Jill Holt MRN: [id number] Date of Birth: [birth year]  Date of Discharge from PT service:March 19, 2023  Today's Date: 03/19/2023    Patient has met 8 of 8 long term goals due to improved activity tolerance, improved balance, increased strength, decreased pain, improved awareness, and improved coordination.  Patient to discharge at an ambulatory level Modified Independent.   Patient's care partner is independent to provide the necessary physical assistance at discharge.  Reasons goals not met: N/A all goals met  Recommendation:  Patient will benefit from ongoing skilled PT services in {setting:3041680} to continue to advance safe functional mobility, address ongoing impairments in ***, and minimize fall risk.  Equipment: {equipment:3041657}  Reasons for discharge: treatment goals met and discharge from hospital  Patient/family agrees with progress made and goals achieved: Yes  PT Discharge Precautions/Restrictions Precautions Precautions: Back Other Brace: No brace ordered, LSO present in room and pt reports Dr. Wynetta Emery had her get one prior to surgery. Vital Signs   Pain Pain Assessment Pain Scale: 0-10 Pain Score: 5  Pain Interference Pain Interference Pain Effect on Sleep: 1. Rarely or not at all Pain Interference with Therapy Activities: 1. Rarely or not at all Pain Interference with Day-to-Day Activities: 1. Rarely or not at all Vision/Perception     Cognition Overall Cognitive Status: Within Functional Limits for tasks assessed Arousal/Alertness: Awake/alert Orientation Level: Oriented X4 Memory: Appears intact Awareness: Appears intact Problem Solving: Appears intact Safety/Judgment: Appears intact Sensation Sensation Light Touch: Impaired by gross assessment Peripheral sensation comments: Numbness/pressure in LLE, some numbness/tingling in RLE; Light Touch Impaired Details: Impaired  RLE;Impaired LLE Hot/Cold: Appears Intact Proprioception: Appears Intact Stereognosis: Not tested Coordination Gross Motor Movements are Fluid and Coordinated: Yes Coordination and Movement Description: Deficits due to B foot drop & increased surgical pain. Motor  Motor Motor: Other (comment) Motor - Skilled Clinical Observations: muscle weakness, BIL foot drop  Mobility Bed Mobility Bed Mobility: Supine to Sit;Sit to Supine Supine to Sit: Independent with assistive device Sit to Supine: Independent with assistive device Transfers Transfers: Sit to Stand;Stand to Sit Sit to Stand: Independent with assistive device Stand to Sit: Independent with assistive device Transfer (Assistive device): Rollator Locomotion  Gait Ambulation: Yes Gait Assistance: Independent with assistive device Gait Distance (Feet): 150 Feet Assistive device: Rollator Gait Gait: Yes Gait Pattern: Decreased dorsiflexion - left;Decreased dorsiflexion - right;Step-through pattern Stairs / Additional Locomotion Stairs: Yes Stairs Assistance: Supervision/Verbal cueing Stair Management Technique: One rail Left;With cane Number of Stairs: 12 Height of Stairs: 6 Ramp: Independent with assistive device Curb: Supervision/Verbal cueing Pick up small object from the floor assist level: Independent with assistive device Pick up small object from the floor assistive device: With rollator and Chief Operating Officer Mobility: No  Trunk/Postural Assessment  Cervical Assessment Cervical Assessment: Within Functional Limits Thoracic Assessment Thoracic Assessment: Within Functional Limits Lumbar Assessment Lumbar Assessment: Exceptions to [facility] (LSO/spinal precautions) Postural Control Postural Control: Within Functional Limits  Balance Balance Balance Assessed: Yes Static Sitting Balance Static Sitting - Balance Support: Feet supported Static Sitting - Level of Assistance: 7:  Independent Dynamic Sitting Balance Dynamic Sitting - Balance Support: Feet unsupported Dynamic Sitting - Level of Assistance: 7: Independent Dynamic Sitting - Balance Activities: Forward lean/weight shifting;Reaching for objects Static Standing Balance Static Standing - Balance Support: Bilateral upper extremity supported Static Standing - Level of Assistance: 6: Modified independent (Device/Increase time) Dynamic Standing Balance Dynamic Standing - Balance Support: During functional activity Dynamic Standing -  Level of Assistance: 5: Stand by assistance Dynamic Standing - Balance Activities: Ball toss Dynamic Standing - Comments: on compliant and non-compliant surfaces Extremity Assessment  RUE Assessment RUE Assessment: Within Functional Limits LUE Assessment LUE Assessment: Within Functional Limits RLE Assessment RLE Assessment: Exceptions to [facility] RLE Strength Right Hip Flexion: 4-/5 Right Knee Flexion: 4/5 Right Knee Extension: 4/5 Right Ankle Dorsiflexion: 1/5 Right Ankle Plantar Flexion: 3/5 LLE Assessment LLE Assessment: Exceptions to [facility] LLE Strength Left Hip Flexion: 4-/5 Left Knee Flexion: 4-/5 Left Knee Extension: 4-/5 Left Ankle Dorsiflexion: 1/5 Left Ankle Plantar Flexion: 3+/5   Rosita DeChalus 03/19/2023, 9:06 AM

## 2023-03-19 NOTE — Progress Notes (Signed)
Physical Therapy Session Note  Patient Details  Name: Jill Holt MRN: [id number] Date of Birth: Jun 20, 1970  Today's Date: 03/19/2023 PT Individual Time: 0805-0900 and 1300-1345 PT Individual Time Calculation (min): 55 min and 45 min  Short Term Goals: Week 2:  PT Short Term Goal 1 (Week 2): =LTGs d/t ELOS  Skilled Therapeutic Interventions/Progress Updates: Pt presented in w/c agreeable to therapy. Pt states mild unrated pain which she states was pemedicated. Pt was able to don shoes and AFO while in w/c mod I. Pt ambulated to ortho gym with rollator mod I. Pt completed functional mobility tasks including car transfer, ambulation on ramp, stairs, and uneven surfaces with mod I. Pt was able to ascend/descend x 12 steps with 1 rail and SBQC with supervision with a step to pattern however demonstrated good safety. Pt also participated in use of rebounder with ball on both compliant and non-compliant surfaces. Pt able to perform maintaining good balance and no significant sway. Pt ambulated back to room and was able to demonstrate bed mobility on flat surface without use of features. Pt returned to w/c and left seated with call bell within reach and needs met. Pt noted to now be mod I in room with understanding that if pt needs any assistance is able to call for assistance from staff, nsg aware.   Tx2: Pt presented in bed agreeable to therapy. Pt c/o unrated back pain but no intervention requested and no pain behaviors demonstrated. Pt completed bed moblity mod I and donned shoes/AFO mod I. Pt ambulated to bathroom with rollaor mod I and had continent urinary void. Pt then ambulated to day room with rollator mod I. At hight/low mat pt worked on Sit to stand without use of UE from elevated surface. Pt cued to decrease compensatory moves such as bracing against mat and working on weight shifting anteriorly to promote improved quad activation. Pt attempted to perform with Tidal Weight however pt noted to  brace against BLE for increased support. Pt then participated in NuStep L3 x 8 min for global conditioning then an additional x 2 min BLE only. Pt then ambulated back to room in same manner as prior and left in recliner at end of session with current needs met.      Therapy Documentation Precautions:  Precautions Precautions: Back Precaution Comments: Pt able to recall 3/3 precautions. Required Braces or Orthoses: Other Brace Other Brace: No brace ordered, LSO present in room and pt reports Dr. Wynetta Emery had her get one prior to surgery. Restrictions Weight Bearing Restrictions: No General:   Vital Signs:   Pain: Pain Assessment Pain Scale: 0-10 Pain Score: 6   Therapy/Group: Individual Therapy  Jill Holt 03/19/2023, 3:28 PM

## 2023-03-19 NOTE — Plan of Care (Signed)
  Problem: Consults Goal: RH SPINAL CORD INJURY PATIENT EDUCATION Description:  See Patient Education module for education specifics.  Outcome: Progressing   Problem: SCI BOWEL ELIMINATION Goal: RH STG MANAGE BOWEL WITH ASSISTANCE Description: STG Manage Bowel with min Assistance. Outcome: Progressing Goal: RH STG SCI MANAGE BOWEL WITH MEDICATION WITH ASSISTANCE Description: STG SCI Manage bowel with medication with min assistance. Outcome: Progressing Goal: RH STG SCI MANAGE BOWEL PROGRAM W/ASSIST OR AS APPROPRIATE Description: STG SCI Manage bowel program w/min assist or as appropriate. Outcome: Progressing   Problem: SCI BLADDER ELIMINATION Goal: RH STG MANAGE BLADDER WITH ASSISTANCE Description: STG Manage Bladder With min Assistance Outcome: Progressing Goal: RH STG MANAGE BLADDER WITH MEDICATION WITH ASSISTANCE Description: STG Manage Bladder With Medication With min Assistance. Outcome: Progressing   Problem: RH SKIN INTEGRITY Goal: RH STG SKIN FREE OF INFECTION/BREAKDOWN Description: Incision will remain intact and be free of infection/breakdown with min assist  Outcome: Progressing Goal: RH STG MAINTAIN SKIN INTEGRITY WITH ASSISTANCE Description: STG Maintain Skin Integrity With min Assistance. Outcome: Progressing Goal: RH STG ABLE TO PERFORM INCISION/WOUND CARE W/ASSISTANCE Description: STG Able To Perform Incision/Wound Care With min Assistance. Outcome: Progressing   Problem: RH SAFETY Goal: RH STG ADHERE TO SAFETY PRECAUTIONS W/ASSISTANCE/DEVICE Description: STG Adhere to Safety Precautions With min Assistance/Device. Outcome: Progressing Goal: RH STG DECREASED RISK OF FALL WITH ASSISTANCE Description: STG Decreased Risk of Fall With cueing Assistance. Outcome: Progressing   Problem: RH PAIN MANAGEMENT Goal: RH STG PAIN MANAGED AT OR BELOW PT'S PAIN GOAL Description: Less than 4 with PRN medications min assist  Outcome: Progressing   Problem: RH KNOWLEDGE  DEFICIT SCI Goal: RH STG INCREASE KNOWLEDGE OF SELF CARE AFTER SCI Description: Patient will be able to manage medications, bowels, and diet/ lifestyle modifications to improve A1C of 7.8 from nursing education and nursing handouts independently  Outcome: Progressing

## 2023-03-19 NOTE — Progress Notes (Signed)
Occupational Therapy Session Note  Patient Details  Name: Jill Holt MRN: [id number] Date of Birth: May 30, 1970  Today's Date: 03/19/2023 OT Individual Time: 1400-1429 OT Individual Time Calculation (min): 29 min    Short Term Goals: Week 2:  OT Short Term Goal 1 (Week 2): STG=LTG 2/2 ELOS  Skilled Therapeutic Interventions/Progress Updates:    OT intervention with focus on functional amb with Rollator, home safety, and standing balance without UE support to increase independence with BADLs and prepare for d/c home tomorrow. All amb with mod I. Standing activities at Wii without UE support with focus on weight shifts and balance reactions to decrease fall risk. Reviewed home safety recommendations. Pt verbalized understanding of all recommendations. Pt returned to room and remained in w/c. Pt mod I in room.   Therapy Documentation Precautions:  Precautions Precautions: Back Precaution Comments: Pt able to recall 3/3 precautions. Required Braces or Orthoses: Other Brace Other Brace: No brace ordered, LSO present in room and pt reports Dr. Wynetta Emery had her get one prior to surgery. Restrictions Weight Bearing Restrictions: No   Pain:  Pt c/o bil quadriceps soreness from earlier therapy; meds requested from RN at end of session  Therapy/Group: Individual Therapy  Rich Brave 03/19/2023, 2:38 PM

## 2023-03-20 LAB — GLUCOSE, CAPILLARY: Glucose-Capillary: 110 mg/dL — ABNORMAL HIGH (ref 70–99)

## 2023-03-20 NOTE — Progress Notes (Signed)
PROGRESS NOTE   Subjective/Complaints:  Pt reports LBM yesterday Slept great, but feels drained this AM since slept so well.   Ready for d/c.  ROS:    Pt denies SOB, abd pain, CP, N/V/C/D, and vision changes   Objective:   No results found. Recent Labs    03/19/23 0613  WBC 4.8  HGB 9.3*  HCT 30.1*  PLT 395    Recent Labs    03/19/23 0613  NA 139  K 3.8  CL 100  CO2 28  GLUCOSE 120*  BUN 7  CREATININE 0.72  CALCIUM 9.3     Intake/Output Summary (Last 24 hours) at 03/20/2023 0829 Last data filed at 03/20/2023 0805 Gross per 24 hour  Intake 480 ml  Output --  Net 480 ml        Physical Exam: Vital Signs Blood pressure (!) 146/76, pulse 100, temperature 98.3 F (36.8 C), temperature source Oral, resp. rate 18, height 5\' 4"  (1.626 m), weight 84 kg, last menstrual period 05/31/2022, SpO2 100%.     General: awake, alert, appropriate, sitting up in wheelchair at bedside eating breakfast; NAD HENT: conjugate gaze; oropharynx moist CV: regular rhythm; mildly tachycardic rate; no JVD Pulmonary: CTA B/L; no W/R/R- good air movement GI: soft, NT, ND, (+)BS- slightly hypoactive Psychiatric: appropriate Neurological: Ox3   Extremities- no LE edema today B/L  Skin: Wound dressed, bandage clean/opsite--didn't remove Neurologic: Cranial nerves II through XII intact, motor strength is 5/5 in bilateral deltoid, bicep, tricep, grip, pain still a barrier to LE. 2-3/5  hip flexor, knee extensors, ankle dorsiflexor and plantar flexor--stable 11/17 Sensory exam hypersensitive to LT L5-S1 , intact LT L2-S1  Musculoskeletal: Full range of motion in all 4 extremities. No joint swelling   Assessment/Plan: 1. Functional deficits which require 3+ hours per day of interdisciplinary therapy in a comprehensive inpatient rehab setting. Physiatrist is providing close team supervision and 24 hour management of active  medical problems listed below. Physiatrist and rehab team continue to assess barriers to discharge/monitor patient progress toward functional and medical goals  Care Tool:  Bathing    Body parts bathed by patient: Right arm, Left arm, Chest, Abdomen, Front perineal area, Face, Right upper leg, Left upper leg, Buttocks, Right lower leg, Left lower leg   Body parts bathed by helper: Right lower leg, Left lower leg, Buttocks     Bathing assist Assist Level: Independent with assistive device     Upper Body Dressing/Undressing Upper body dressing   What is the patient wearing?: Pull over shirt, Bra, Orthosis    Upper body assist Assist Level: Independent with assistive device    Lower Body Dressing/Undressing Lower body dressing      What is the patient wearing?: Underwear/pull up, Pants     Lower body assist Assist for lower body dressing: Independent with assitive device     Toileting Toileting    Toileting assist Assist for toileting: Independent with assistive device     Transfers Chair/bed transfer  Transfers assist     Chair/bed transfer assist level: Independent with assistive device Chair/bed transfer assistive device: Armrests   Locomotion Ambulation   Ambulation assist  Assist level: Independent with assistive device Assistive device: Rollator Max distance: 150   Walk 10 feet activity   Assist     Assist level: Independent with assistive device Assistive device: Rollator   Walk 50 feet activity   Assist Walk 50 feet with 2 turns activity did not occur: Safety/medical concerns  Assist level: Independent with assistive device Assistive device: Rollator    Walk 150 feet activity   Assist Walk 150 feet activity did not occur: Safety/medical concerns  Assist level: Independent with assistive device Assistive device: Rollator    Walk 10 feet on uneven surface  activity   Assist Walk 10 feet on uneven surfaces activity did not  occur: Safety/medical concerns   Assist level: Supervision/Verbal cueing Assistive device: Rollator   Wheelchair     Assist Is the patient using a wheelchair?: No Type of Wheelchair: Manual    Wheelchair assist level: Dependent - Patient 0% Max wheelchair distance: 150    Wheelchair 50 feet with 2 turns activity    Assist        Assist Level: Dependent - Patient 0%   Wheelchair 150 feet activity     Assist      Assist Level: Dependent - Patient 0%   Blood pressure (!) 146/76, pulse 100, temperature 98.3 F (36.8 C), temperature source Oral, resp. rate 18, height 5\' 4"  (1.626 m), weight 84 kg, last menstrual period 05/31/2022, SpO2 100%.  Medical Problem List and Plan: 1. 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  Status post redo decompressive laminectomies L3-4 and L4-5 with complete facetectomies and radical foraminotomies , L3-03/03/2023 LE weakness is in part due to pain, will optimize regimen              -patient may shower             -ELOS/Goals: 8-11 days with mod I goals PT, OT             -PRAFO's for both LE's at night             -will need AFO's for gait  D/c today  Will have outpt therapies  Doesn't need f/u with me- unless needs me- f/u with [facility] 2. Antithrombotics: -DVT/anticoagulation:  Mechanical:  Antiembolism stockings, knee (TED hose) Bilateral lower extremities.  Check vascular study.  Discussed with neurosurgery on beginning Lovenox 11/11- Dopplers (-)               -antiplatelet therapy: N/A 3. Pain Management: currently on 600 mg twice daily, Flexeril and hydrocodone as needed             -pt was on gabapentin 900mg  tid at home--increase to 600mg  tid to start  11/13- pain getting better  11/14- will reduce gabapentin to 300 mg TID- and see if swelling improves- she feels with reduction in pain, can reduce gabapentin  11/15- pain didn't increase- with  reduction in gabapentin- swelling has improved  11/17 appears controlled.  continue with current plan  11/18- pain well controlled- con't low dose gabapentin   11/19- we discussed stopping gabapentin- since swelling gone, I don't think we should stop it since helps nerve pain- pt agreed- will keep for now 4. Mood/Behavior/Sleep: Provide emotional support- labile this am , "feels overwhelmed by medical issues "             -antipsychotic agents: N/A 5. Neuropsych/cognition: This patient is capable of making decisions on her own behalf. 6.  Skin/Wound Care: Routine skin checks 7. Fluids/Electrolytes/Nutrition: Routine in and outs with follow-up chemistries 8.  Hypertension.  Norvasc 10 mg daily, Cozaar 100 mg daily, hydrochlorothiazide 12.5 mg daily, Toprol-XL 25 mg daily.  Monitor with increased mobility  11/11- BP controlled after meds taken for AM- will monitor- is labile before/after meds  11/13- HR low 100s and BP running 140s-160s- increased Toprol XL tl 50 mg daily since HR and BP elevated  11/15- - Metoprolol increased yesterday- HR down to 96 this AM- will monitor and titrate as required  11/17 BP/HR remained elevated despite feeling better today   -will increase metoprolol to 75mg  daily (XL)  11/18- just got increase in Metoprolol- so will wait to make changes  11/19- BP doing better overall- HR still low 100s- but BP is coming down slowly- con't regimen for now  11/21- BP doing better onc urrent regimen- send home on regimen Vitals:   03/19/23 1938 03/20/23 0543  BP: 126/80 (!) 146/76  Pulse: 97 100  Resp: 18 18  Temp: 99.1 F (37.3 C) 98.3 F (36.8 C)  SpO2: 97% 100%    9.  Diabetes mellitus.  Hemoglobin A1c 7.8.  NovoLog Mix 70/30 35 units with breakfast, 25 units with supper, Glucotrol XL 10 mg daily. CBG (last 3)  Recent Labs    03/19/23 1649 03/19/23 2051 03/20/23 0541  GLUCAP 156* 128* 110*    11/11- CBGs dropped to 44/34 last night due to not eating dinner and  family not bringing in food like planned- will order to give 70/30 only if BG >120- and has food- changed both orders  11/14- Cbgs elevated x1- will monitor- usually when refuses 70/30  11/15- Dropped CBG to 66 last night- wil reduce night time insulin to 70/30 15 units from 25 units- she noted at home didn't take insulin unless CBGs >200- unless took ~ 10 units max.   11/17 increased pm 70/30 to 20 units for better AM control. Sugars better this morning. Observe today  11/19- CBG 218 this AM- given 70/30 insulin- also got last night's 70/30 as wlel- didn't dip below 70  11/21- CBGs looking better overall- d/c on current regimen 10.  Hyperlipidemia.  Lipitor 11.  Constipation.  Colace twice daily, MiraLAX BID, have added Amitiza for OIC, give dulc supp this pm if no BM   11/11- had BM yesterday feeling much better  11/12- LBM yesterday   11/13- said needed ot have BM- has not today- will give Sorbitol, etc tomorrow if no BM  11/14- Will give Sorbitol today and change Miralax to 2 senna daily.   11/18- LBM yesterday 11/19-LBM 2 days ago- large- type 5- if no BM by tomorrow- will need to intervene   11/21- LBM 2 days ago- but doesn't want intervention before goes home. Doesn't feel constipated 12. Mild hypoxia:             -wean oxygen as able             -check CBC in am 13. C/o dizziness while up with therapy. Sinus tachy on exam             -CBC as above, check BMET as well             -encourage fluids   11/12- Still mildly tachycardic- even at rest- might be anemia- last Hb 8.9 (getting better from prior 8.5,) but pt used to 12-13 Hb-    11/16 consider adjusting metoprolol--recheck cbc monday  11/19- will  check CBC tomorrow 14.  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 Nausea:  -pt feels it's due to  lipitor which was started new in hospital.   -11/17 stopped it yesterday and she is feeling better. Can discuss with primary as outpt other options.    11/18- feeling better   The patient is medically ready for discharge to home and  give pt my phone number for clinic- call if need be.   need follow-up with [facility] PM[facility]. In addition, they will need to follow up with their PCP, Neurosurgery.    LOS: 13 days A FACE TO FACE EVALUATION WAS PERFORMED  Leonie Amacher 03/20/2023, 8:29 AM

## 2023-03-20 NOTE — Plan of Care (Signed)
  Problem: Consults Goal: RH SPINAL CORD INJURY PATIENT EDUCATION Description:  See Patient Education module for education specifics.  Outcome: Progressing   Problem: SCI BOWEL ELIMINATION Goal: RH STG MANAGE BOWEL WITH ASSISTANCE Description: STG Manage Bowel with min Assistance. Outcome: Progressing Goal: RH STG SCI MANAGE BOWEL WITH MEDICATION WITH ASSISTANCE Description: STG SCI Manage bowel with medication with min assistance. Outcome: Progressing Goal: RH STG SCI MANAGE BOWEL PROGRAM W/ASSIST OR AS APPROPRIATE Description: STG SCI Manage bowel program w/min assist or as appropriate. Outcome: Progressing   Problem: SCI BLADDER ELIMINATION Goal: RH STG MANAGE BLADDER WITH ASSISTANCE Description: STG Manage Bladder With min Assistance Outcome: Progressing Goal: RH STG MANAGE BLADDER WITH MEDICATION WITH ASSISTANCE Description: STG Manage Bladder With Medication With min Assistance. Outcome: Progressing   Problem: RH SKIN INTEGRITY Goal: RH STG SKIN FREE OF INFECTION/BREAKDOWN Description: Incision will remain intact and be free of infection/breakdown with min assist  Outcome: Progressing Goal: RH STG MAINTAIN SKIN INTEGRITY WITH ASSISTANCE Description: STG Maintain Skin Integrity With min Assistance. Outcome: Progressing Goal: RH STG ABLE TO PERFORM INCISION/WOUND CARE W/ASSISTANCE Description: STG Able To Perform Incision/Wound Care With min Assistance. Outcome: Progressing   Problem: RH SAFETY Goal: RH STG ADHERE TO SAFETY PRECAUTIONS W/ASSISTANCE/DEVICE Description: STG Adhere to Safety Precautions With min Assistance/Device. Outcome: Progressing Goal: RH STG DECREASED RISK OF FALL WITH ASSISTANCE Description: STG Decreased Risk of Fall With cueing Assistance. Outcome: Progressing   Problem: RH PAIN MANAGEMENT Goal: RH STG PAIN MANAGED AT OR BELOW PT'S PAIN GOAL Description: Less than 4 with PRN medications min assist  Outcome: Progressing   Problem: RH KNOWLEDGE  DEFICIT SCI Goal: RH STG INCREASE KNOWLEDGE OF SELF CARE AFTER SCI Description: Patient will be able to manage medications, bowels, and diet/ lifestyle modifications to improve A1C of 7.8 from nursing education and nursing handouts independently  Outcome: Progressing

## 2023-03-20 NOTE — Progress Notes (Signed)
Inpatient Rehabilitation Care Coordinator Discharge Note   Patient Details  Name: Jill Holt MRN: [id number] Date of Birth: May 22, 1970   Discharge location: D/c to home with her daughter  Length of Stay: 12 days  Discharge activity level: Mod I  Home/community participation: Limited  Patient response NF:AOZHYQ Literacy - How often do you need to have someone help you when you read instructions, pamphlets, or other written material from your doctor or pharmacy?: Never  Patient response MV:HQIONG Isolation - How often do you feel lonely or isolated from those around you?: Never  Services provided included: MD, RD, PT, OT, RN, CM, Neuropsych, SW, Pharmacy, TR  Financial Services:  Field seismologist Utilized: Media planner [place] [employer]  Choices offered to/list presented to: patient  Follow-up services arranged:  Home Health, DME, Patient/Family has no preference for HH/DME agencies Home Health Agency: [facility] for HH[facility]/OT    DME : [facility] for rollator and TTB    Patient response to transportation need: Is the patient able to respond to transportation needs?: Yes In the past 12 months, has lack of transportation kept you from medical appointments or from getting medications?: No In the past 12 months, has lack of transportation kept you from meetings, work, or from getting things needed for daily living?: No   Patient/Family verbalized understanding of follow-up arrangements:  Yes  Individual responsible for coordination of the follow-up plan: contact pt [phone]  Confirmed correct DME delivered: Gretchen Short 03/20/2023    Comments (or additional information):fam edu completed  Summary of Stay    Date/Time Discharge Planning CSW  03/17/23 1329 Pt will d/c to home with her dtr who will be the primary caregiver. Pt will be intermittent to supervision level of care as dtr is 4 weeks postpartum and unable to provide any physical  assistane. HHPT/OT-[facility]. fam edu completed on 11/13 with pt dtr. DME ordered- TTB, and rollator with [employer] [facility]. PCS referral made to insurance and assessment on 12/3. SW will confirm there are no barriers to discharge. AAC  03/11/23 0948 Pt will discharge to home with support from dtr that can provide intermittent supervision as she has a newborn, and unable to provide physical assistance. Pt needs to be Mod I/Supervision at discharge. PCS referral sent to insurance for aide support. Fam edu on Wed (11/13) 1pm-3pm with pt dtr. SW will confirm there are no barriers to discharge. AAC       Sherrica Niehaus A Lula Olszewski

## 2023-03-25 ENCOUNTER — Other Ambulatory Visit (HOSPITAL_COMMUNITY): Payer: Self-pay
